# Patient Record
Sex: Female | Born: 1940
Health system: Southern US, Community
[De-identification: ages and names within clinical notes are randomized; demographics above are authoritative.]

## PROBLEM LIST (undated history)

## (undated) DIAGNOSIS — I10 Essential (primary) hypertension: Secondary | ICD-10-CM

## (undated) DIAGNOSIS — M109 Gout, unspecified: Secondary | ICD-10-CM

## (undated) DIAGNOSIS — IMO0002 Reserved for concepts with insufficient information to code with codable children: Secondary | ICD-10-CM

## (undated) DIAGNOSIS — M810 Age-related osteoporosis without current pathological fracture: Secondary | ICD-10-CM

## (undated) DIAGNOSIS — E785 Hyperlipidemia, unspecified: Secondary | ICD-10-CM

## (undated) DIAGNOSIS — K219 Gastro-esophageal reflux disease without esophagitis: Secondary | ICD-10-CM

## (undated) HISTORY — DX: Age-related osteoporosis without current pathological fracture: M81.0

## (undated) HISTORY — DX: Reserved for concepts with insufficient information to code with codable children: IMO0002

## (undated) HISTORY — DX: Hyperlipidemia, unspecified: E78.5

## (undated) HISTORY — DX: Gout, unspecified: M10.9

## (undated) HISTORY — DX: Essential (primary) hypertension: I10

## (undated) HISTORY — DX: Gastro-esophageal reflux disease without esophagitis: K21.9

---

## 1959-06-08 HISTORY — PX: TONSILLECTOMY: SUR1361

## 1985-06-07 HISTORY — PX: LAMINECTOMY: SHX219

## 1990-06-07 HISTORY — PX: ABDOMINAL HYSTERECTOMY: SHX81

## 2001-12-06 ENCOUNTER — Other Ambulatory Visit: Admission: RE | Admit: 2001-12-06 | Discharge: 2001-12-06 | Payer: Self-pay | Admitting: Gynecology

## 2002-06-04 ENCOUNTER — Encounter: Admission: RE | Admit: 2002-06-04 | Discharge: 2002-06-04 | Payer: Self-pay | Admitting: *Deleted

## 2002-06-04 ENCOUNTER — Encounter: Payer: Self-pay | Admitting: *Deleted

## 2002-12-13 ENCOUNTER — Other Ambulatory Visit: Admission: RE | Admit: 2002-12-13 | Discharge: 2002-12-13 | Payer: Self-pay | Admitting: Gynecology

## 2003-06-28 ENCOUNTER — Encounter: Admission: RE | Admit: 2003-06-28 | Discharge: 2003-06-28 | Payer: Self-pay | Admitting: Gynecology

## 2003-12-16 ENCOUNTER — Other Ambulatory Visit: Admission: RE | Admit: 2003-12-16 | Discharge: 2003-12-16 | Payer: Self-pay | Admitting: Gynecology

## 2004-06-29 ENCOUNTER — Encounter: Admission: RE | Admit: 2004-06-29 | Discharge: 2004-06-29 | Payer: Self-pay | Admitting: Gynecology

## 2004-12-16 ENCOUNTER — Other Ambulatory Visit: Admission: RE | Admit: 2004-12-16 | Discharge: 2004-12-16 | Payer: Self-pay | Admitting: Gynecology

## 2005-06-30 ENCOUNTER — Encounter: Admission: RE | Admit: 2005-06-30 | Discharge: 2005-06-30 | Payer: Self-pay | Admitting: Gynecology

## 2005-12-09 ENCOUNTER — Other Ambulatory Visit: Admission: RE | Admit: 2005-12-09 | Discharge: 2005-12-09 | Payer: Self-pay | Admitting: Gynecology

## 2006-07-01 ENCOUNTER — Encounter: Admission: RE | Admit: 2006-07-01 | Discharge: 2006-07-01 | Payer: Self-pay | Admitting: Gynecology

## 2006-12-13 ENCOUNTER — Other Ambulatory Visit: Admission: RE | Admit: 2006-12-13 | Discharge: 2006-12-13 | Payer: Self-pay | Admitting: Gynecology

## 2007-07-04 ENCOUNTER — Encounter: Admission: RE | Admit: 2007-07-04 | Discharge: 2007-07-04 | Payer: Self-pay | Admitting: Gynecology

## 2008-07-04 ENCOUNTER — Encounter: Admission: RE | Admit: 2008-07-04 | Discharge: 2008-07-04 | Payer: Self-pay | Admitting: Gynecology

## 2009-01-24 ENCOUNTER — Encounter (INDEPENDENT_AMBULATORY_CARE_PROVIDER_SITE_OTHER): Payer: Self-pay | Admitting: *Deleted

## 2009-02-19 ENCOUNTER — Ambulatory Visit: Payer: Self-pay | Admitting: Gastroenterology

## 2009-02-20 ENCOUNTER — Telehealth (INDEPENDENT_AMBULATORY_CARE_PROVIDER_SITE_OTHER): Payer: Self-pay | Admitting: *Deleted

## 2009-03-05 ENCOUNTER — Encounter: Payer: Self-pay | Admitting: Gastroenterology

## 2009-03-05 ENCOUNTER — Ambulatory Visit: Payer: Self-pay | Admitting: Gastroenterology

## 2009-03-06 ENCOUNTER — Encounter: Payer: Self-pay | Admitting: Gastroenterology

## 2009-07-03 ENCOUNTER — Encounter: Admission: RE | Admit: 2009-07-03 | Discharge: 2009-07-03 | Payer: Self-pay | Admitting: Internal Medicine

## 2009-07-07 ENCOUNTER — Encounter: Admission: RE | Admit: 2009-07-07 | Discharge: 2009-07-07 | Payer: Self-pay | Admitting: Gynecology

## 2010-07-07 ENCOUNTER — Encounter
Admission: RE | Admit: 2010-07-07 | Discharge: 2010-07-07 | Payer: Self-pay | Source: Home / Self Care | Attending: Gynecology | Admitting: Gynecology

## 2010-12-28 ENCOUNTER — Other Ambulatory Visit: Payer: Self-pay | Admitting: Gynecology

## 2011-06-08 HISTORY — PX: EYE SURGERY: SHX253

## 2011-06-09 ENCOUNTER — Other Ambulatory Visit: Payer: Self-pay | Admitting: Gynecology

## 2011-06-09 DIAGNOSIS — Z1231 Encounter for screening mammogram for malignant neoplasm of breast: Secondary | ICD-10-CM

## 2011-07-13 ENCOUNTER — Ambulatory Visit
Admission: RE | Admit: 2011-07-13 | Discharge: 2011-07-13 | Disposition: A | Discharge status: Home / Self Care | Payer: BLUE CROSS/BLUE SHIELD | Source: Ambulatory Visit | Attending: Gynecology | Admitting: Gynecology

## 2011-07-13 DIAGNOSIS — Z1231 Encounter for screening mammogram for malignant neoplasm of breast: Secondary | ICD-10-CM

## 2012-06-23 ENCOUNTER — Other Ambulatory Visit: Payer: Self-pay | Admitting: Gynecology

## 2012-06-23 DIAGNOSIS — Z1231 Encounter for screening mammogram for malignant neoplasm of breast: Secondary | ICD-10-CM

## 2012-07-18 ENCOUNTER — Ambulatory Visit
Admission: RE | Admit: 2012-07-18 | Discharge: 2012-07-18 | Disposition: A | Discharge status: Home / Self Care | Payer: Medicare Other | Source: Ambulatory Visit | Attending: Gynecology | Admitting: Gynecology

## 2012-07-18 DIAGNOSIS — Z1231 Encounter for screening mammogram for malignant neoplasm of breast: Secondary | ICD-10-CM

## 2013-02-28 ENCOUNTER — Other Ambulatory Visit: Payer: Self-pay

## 2013-05-24 DIAGNOSIS — IMO0002 Reserved for concepts with insufficient information to code with codable children: Secondary | ICD-10-CM

## 2013-05-24 HISTORY — DX: Reserved for concepts with insufficient information to code with codable children: IMO0002

## 2013-05-25 ENCOUNTER — Ambulatory Visit
Admission: RE | Admit: 2013-05-25 | Discharge: 2013-05-25 | Disposition: A | Discharge status: Home / Self Care | Payer: Medicare Other | Source: Ambulatory Visit | Attending: Orthopaedic Surgery | Admitting: Orthopaedic Surgery

## 2013-05-25 ENCOUNTER — Other Ambulatory Visit: Payer: Self-pay | Admitting: Orthopaedic Surgery

## 2013-05-25 DIAGNOSIS — M549 Dorsalgia, unspecified: Secondary | ICD-10-CM

## 2013-06-15 ENCOUNTER — Other Ambulatory Visit: Payer: Self-pay | Admitting: Neurosurgery

## 2013-06-15 DIAGNOSIS — S32009A Unspecified fracture of unspecified lumbar vertebra, initial encounter for closed fracture: Secondary | ICD-10-CM

## 2013-06-22 ENCOUNTER — Ambulatory Visit
Admission: RE | Admit: 2013-06-22 | Discharge: 2013-06-22 | Disposition: A | Discharge status: Home / Self Care | Payer: Medicare HMO | Source: Ambulatory Visit | Attending: Neurosurgery | Admitting: Neurosurgery

## 2013-06-22 DIAGNOSIS — S32009A Unspecified fracture of unspecified lumbar vertebra, initial encounter for closed fracture: Secondary | ICD-10-CM

## 2013-06-22 MED ORDER — GADOBENATE DIMEGLUMINE 529 MG/ML IV SOLN
19.0000 mL | Freq: Once | INTRAVENOUS | Status: AC | PRN
  Administered 2013-06-22: 19 mL via INTRAVENOUS

## 2013-07-08 DIAGNOSIS — IMO0002 Reserved for concepts with insufficient information to code with codable children: Secondary | ICD-10-CM

## 2013-07-08 HISTORY — DX: Reserved for concepts with insufficient information to code with codable children: IMO0002

## 2013-07-26 ENCOUNTER — Other Ambulatory Visit: Payer: Self-pay

## 2013-07-26 DIAGNOSIS — Z1231 Encounter for screening mammogram for malignant neoplasm of breast: Secondary | ICD-10-CM

## 2013-08-07 ENCOUNTER — Ambulatory Visit
Admission: RE | Admit: 2013-08-07 | Discharge: 2013-08-07 | Disposition: A | Discharge status: Home / Self Care | Payer: Commercial Managed Care - HMO | Source: Ambulatory Visit

## 2013-08-07 DIAGNOSIS — Z1231 Encounter for screening mammogram for malignant neoplasm of breast: Secondary | ICD-10-CM

## 2013-12-06 ENCOUNTER — Other Ambulatory Visit (HOSPITAL_COMMUNITY): Payer: Self-pay | Admitting: Adult Health Nurse Practitioner

## 2013-12-06 ENCOUNTER — Ambulatory Visit (HOSPITAL_COMMUNITY)
Admission: RE | Admit: 2013-12-06 | Discharge: 2013-12-06 | Disposition: A | Discharge status: Home / Self Care | Payer: Medicare HMO | Source: Ambulatory Visit | Attending: Adult Health Nurse Practitioner | Admitting: Adult Health Nurse Practitioner

## 2013-12-06 DIAGNOSIS — R609 Edema, unspecified: Secondary | ICD-10-CM | POA: Insufficient documentation

## 2013-12-06 DIAGNOSIS — M79609 Pain in unspecified limb: Secondary | ICD-10-CM | POA: Insufficient documentation

## 2013-12-06 DIAGNOSIS — R52 Pain, unspecified: Secondary | ICD-10-CM

## 2014-01-24 ENCOUNTER — Encounter: Payer: Self-pay | Admitting: Gastroenterology

## 2014-01-29 ENCOUNTER — Other Ambulatory Visit: Payer: Self-pay | Admitting: Family Medicine

## 2014-02-19 ENCOUNTER — Encounter: Payer: Self-pay | Admitting: Family Medicine

## 2014-02-19 ENCOUNTER — Ambulatory Visit (INDEPENDENT_AMBULATORY_CARE_PROVIDER_SITE_OTHER): Payer: Commercial Managed Care - HMO | Admitting: Family Medicine

## 2014-02-19 ENCOUNTER — Encounter (INDEPENDENT_AMBULATORY_CARE_PROVIDER_SITE_OTHER): Payer: Self-pay

## 2014-02-19 VITALS — BP 156/93 | HR 67 | Temp 97.8°F | Ht 63.0 in | Wt 191.0 lb

## 2014-02-19 DIAGNOSIS — M1A00X0 Idiopathic chronic gout, unspecified site, without tophus (tophi): Secondary | ICD-10-CM

## 2014-02-19 DIAGNOSIS — I10 Essential (primary) hypertension: Secondary | ICD-10-CM

## 2014-02-19 DIAGNOSIS — K219 Gastro-esophageal reflux disease without esophagitis: Secondary | ICD-10-CM

## 2014-02-19 DIAGNOSIS — M1A9XX0 Chronic gout, unspecified, without tophus (tophi): Secondary | ICD-10-CM

## 2014-02-19 DIAGNOSIS — Z23 Encounter for immunization: Secondary | ICD-10-CM

## 2014-02-19 DIAGNOSIS — E785 Hyperlipidemia, unspecified: Secondary | ICD-10-CM

## 2014-02-19 DIAGNOSIS — M810 Age-related osteoporosis without current pathological fracture: Secondary | ICD-10-CM

## 2014-02-19 DIAGNOSIS — M109 Gout, unspecified: Secondary | ICD-10-CM | POA: Insufficient documentation

## 2014-02-19 NOTE — Progress Notes (Signed)
   Subjective:    Patient ID: Caitlyn Boone, female    DOB: 30-Dec-1940, 73 y.o.   MRN: [id number]  HPI  73 year old female who is here to followup hypertension hyperlipidemia osteoporosis GERD and gout. Actual purpose of her visit is to obtain referral back to the orthopedic doctor in [facility] for what sounds like a fractured patella. She suffered a vertebral fracture about a year ago when she was helping to pick up a person who had fallen and she in turn fell. She is graduated from the wheelchair to a walker to cane over the last year. She has been started on a bisphosphonate for osteoporosis. She also takes calcium and vitamin D    Review of Systems  Constitutional: Negative.   HENT: Negative.   Eyes: Negative.   Respiratory: Negative.   Cardiovascular: Negative.   Gastrointestinal: Negative.   Endocrine: Negative.   Genitourinary: Negative.   Musculoskeletal: Positive for myalgias.       Bilateral thumb pain  Skin: Rash: not true rash but healing intertrigo.  Hematological: Negative.   Psychiatric/Behavioral: Negative.        Objective:   Physical Exam  Constitutional: She is oriented to person, place, and time. She appears well-developed and well-nourished.  Eyes: Conjunctivae and EOM are normal.  Neck: Normal range of motion. Neck supple.  Cardiovascular: Normal rate, regular rhythm and normal heart sounds.   Pulmonary/Chest: Effort normal and breath sounds normal.  Abdominal: Soft. Bowel sounds are normal.  Musculoskeletal: Normal range of motion.  Neurological: She is alert and oriented to person, place, and time. She has normal reflexes.  Skin: Skin is warm and dry.  Psychiatric: She has a normal mood and affect. Her behavior is normal. Thought content normal.    BP 156/93  Pulse 67  Temp(Src) 97.8 F (36.6 C) (Oral)  Ht 5\' 3"  (1.6 m)  Wt 191 lb (86.637 kg)  BMI 33.84 kg/m2      Assessment & Plan:  1. Other and unspecified hyperlipidemia On 80 mg  pravastatin; no side effect; lipids measures in June  2. Osteoporosis, unspecified On Fosamax  3. Unspecified essential hypertension BP up some today; will follow  4. Gastroesophageal reflux disease, esophagitis presence not specified Ranitidine controls but takles q day  5. Chronic gout without tophus, unspecified cause, unspecified site Does not recall last acute attack; on Allopurinol  Wardell Honour MD

## 2014-02-19 NOTE — Patient Instructions (Signed)

## 2014-02-25 ENCOUNTER — Ambulatory Visit: Payer: Self-pay | Admitting: Nurse Practitioner

## 2014-03-07 ENCOUNTER — Encounter: Payer: Self-pay | Admitting: Gastroenterology

## 2014-04-02 ENCOUNTER — Ambulatory Visit (INDEPENDENT_AMBULATORY_CARE_PROVIDER_SITE_OTHER): Payer: Commercial Managed Care - HMO

## 2014-04-02 DIAGNOSIS — Z23 Encounter for immunization: Secondary | ICD-10-CM

## 2014-05-10 ENCOUNTER — Other Ambulatory Visit: Payer: Self-pay | Admitting: *Deleted

## 2014-05-10 MED ORDER — RANITIDINE HCL 300 MG PO CAPS: 300.0000 mg | ORAL_CAPSULE | Freq: Every evening | ORAL | Status: DC

## 2014-05-21 ENCOUNTER — Telehealth: Payer: Self-pay | Admitting: Family Medicine

## 2014-05-21 ENCOUNTER — Emergency Department (HOSPITAL_COMMUNITY)
Admission: EM | Admit: 2014-05-21 | Discharge: 2014-05-22 | Disposition: A | Discharge status: Home / Self Care | Payer: Medicare HMO | Attending: Emergency Medicine | Admitting: Emergency Medicine

## 2014-05-21 ENCOUNTER — Encounter (HOSPITAL_COMMUNITY): Payer: Self-pay | Admitting: Emergency Medicine

## 2014-05-21 ENCOUNTER — Ambulatory Visit (INDEPENDENT_AMBULATORY_CARE_PROVIDER_SITE_OTHER): Payer: Commercial Managed Care - HMO | Admitting: Family Medicine

## 2014-05-21 ENCOUNTER — Emergency Department (HOSPITAL_COMMUNITY): Payer: Medicare HMO

## 2014-05-21 VITALS — BP 147/89 | HR 102 | Temp 100.4°F | Ht 63.0 in | Wt 190.6 lb

## 2014-05-21 DIAGNOSIS — E785 Hyperlipidemia, unspecified: Secondary | ICD-10-CM | POA: Diagnosis not present

## 2014-05-21 DIAGNOSIS — N390 Urinary tract infection, site not specified: Secondary | ICD-10-CM | POA: Diagnosis not present

## 2014-05-21 DIAGNOSIS — Z7982 Long term (current) use of aspirin: Secondary | ICD-10-CM | POA: Diagnosis not present

## 2014-05-21 DIAGNOSIS — K8020 Calculus of gallbladder without cholecystitis without obstruction: Secondary | ICD-10-CM | POA: Diagnosis not present

## 2014-05-21 DIAGNOSIS — R630 Anorexia: Secondary | ICD-10-CM | POA: Insufficient documentation

## 2014-05-21 DIAGNOSIS — Z79899 Other long term (current) drug therapy: Secondary | ICD-10-CM | POA: Insufficient documentation

## 2014-05-21 DIAGNOSIS — M109 Gout, unspecified: Secondary | ICD-10-CM | POA: Diagnosis not present

## 2014-05-21 DIAGNOSIS — K219 Gastro-esophageal reflux disease without esophagitis: Secondary | ICD-10-CM | POA: Insufficient documentation

## 2014-05-21 DIAGNOSIS — Z8781 Personal history of (healed) traumatic fracture: Secondary | ICD-10-CM | POA: Insufficient documentation

## 2014-05-21 DIAGNOSIS — R1031 Right lower quadrant pain: Secondary | ICD-10-CM

## 2014-05-21 DIAGNOSIS — R1011 Right upper quadrant pain: Secondary | ICD-10-CM

## 2014-05-21 DIAGNOSIS — R109 Unspecified abdominal pain: Secondary | ICD-10-CM | POA: Diagnosis present

## 2014-05-21 DIAGNOSIS — Z90710 Acquired absence of both cervix and uterus: Secondary | ICD-10-CM | POA: Insufficient documentation

## 2014-05-21 DIAGNOSIS — N12 Tubulo-interstitial nephritis, not specified as acute or chronic: Secondary | ICD-10-CM | POA: Diagnosis not present

## 2014-05-21 DIAGNOSIS — I10 Essential (primary) hypertension: Secondary | ICD-10-CM | POA: Insufficient documentation

## 2014-05-21 DIAGNOSIS — R110 Nausea: Secondary | ICD-10-CM | POA: Diagnosis not present

## 2014-05-21 DIAGNOSIS — M810 Age-related osteoporosis without current pathological fracture: Secondary | ICD-10-CM | POA: Insufficient documentation

## 2014-05-21 LAB — POCT URINALYSIS DIPSTICK
Bilirubin, UA: NEGATIVE
Blood, UA: NEGATIVE
Glucose, UA: NEGATIVE
Ketones, UA: NEGATIVE
Nitrite, UA: NEGATIVE
Protein, UA: NEGATIVE
Spec Grav, UA: 1.01
Urobilinogen, UA: NEGATIVE
pH, UA: 6

## 2014-05-21 LAB — CBC WITH DIFFERENTIAL/PLATELET
Basophils Absolute: 0 10*3/uL (ref 0.0–0.1)
Basophils Relative: 0 % (ref 0–1)
EOS ABS: 0 10*3/uL (ref 0.0–0.7)
EOS PCT: 0 % (ref 0–5)
HCT: 44.8 % (ref 36.0–46.0)
Hemoglobin: 15 g/dL (ref 12.0–15.0)
LYMPHS ABS: 2.1 10*3/uL (ref 0.7–4.0)
Lymphocytes Relative: 22 % (ref 12–46)
MCH: 30 pg (ref 26.0–34.0)
MCHC: 33.5 g/dL (ref 30.0–36.0)
MCV: 89.6 fL (ref 78.0–100.0)
MONOS PCT: 7 % (ref 3–12)
Monocytes Absolute: 0.7 10*3/uL (ref 0.1–1.0)
NEUTROS PCT: 71 % (ref 43–77)
Neutro Abs: 6.7 10*3/uL (ref 1.7–7.7)
PLATELETS: 329 10*3/uL (ref 150–400)
RBC: 5 MIL/uL (ref 3.87–5.11)
RDW: 14.5 % (ref 11.5–15.5)
WBC: 9.6 10*3/uL (ref 4.0–10.5)

## 2014-05-21 LAB — COMPREHENSIVE METABOLIC PANEL
ALT: 31 U/L (ref 0–35)
ANION GAP: 13 (ref 5–15)
AST: 36 U/L (ref 0–37)
Albumin: 4.2 g/dL (ref 3.5–5.2)
Alkaline Phosphatase: 61 U/L (ref 39–117)
BUN: 12 mg/dL (ref 6–23)
CALCIUM: 11.1 mg/dL — AB (ref 8.4–10.5)
CO2: 28 mEq/L (ref 19–32)
Chloride: 101 mEq/L (ref 96–112)
Creatinine, Ser: 0.79 mg/dL (ref 0.50–1.10)
GFR calc non Af Amer: 80 mL/min — ABNORMAL LOW (ref >90)
GLUCOSE: 109 mg/dL — AB (ref 70–99)
Potassium: 4.2 mEq/L (ref 3.7–5.3)
SODIUM: 142 meq/L (ref 137–147)
Total Bilirubin: 0.5 mg/dL (ref 0.3–1.2)
Total Protein: 8.3 g/dL (ref 6.0–8.3)

## 2014-05-21 LAB — POCT UA - MICROSCOPIC ONLY
Casts, Ur, LPF, POC: NEGATIVE
Crystals, Ur, HPF, POC: NEGATIVE
Mucus, UA: NEGATIVE
Yeast, UA: NEGATIVE

## 2014-05-21 LAB — POCT CBC
Granulocyte percent: 78.3 %G (ref 37–80)
HCT, POC: 47.2 % (ref 37.7–47.9)
Hemoglobin: 15.7 g/dL (ref 12.2–16.2)
Lymph, poc: 1.9 (ref 0.6–3.4)
MCH, POC: 30.3 pg (ref 27–31.2)
MCHC: 33.2 g/dL (ref 31.8–35.4)
MCV: 91.4 fL (ref 80–97)
MPV: 8.7 fL (ref 0–99.8)
POC Granulocyte: 8.7 — AB (ref 2–6.9)
POC LYMPH PERCENT: 17.5 %L (ref 10–50)
Platelet Count, POC: 355 10*3/uL (ref 142–424)
RBC: 5.2 M/uL (ref 4.04–5.48)
RDW, POC: 14.7 %
WBC: 11.1 10*3/uL — AB (ref 4.6–10.2)

## 2014-05-21 LAB — LIPASE, BLOOD: Lipase: 46 U/L (ref 11–59)

## 2014-05-21 NOTE — ED Provider Notes (Signed)
CSN: [id number]     Arrival date & time 05/21/14  1818 History   First MD Initiated Contact with Patient 05/21/14 2238     Chief Complaint  Patient presents with  . Abdominal Pain     (Consider location/radiation/quality/duration/timing/severity/associated sxs/prior Treatment) HPI Comments: Patient with no past history of abdominal surgeries presents with complaint of right upper and lateral abdominal pain with nausea, decreased appetite, fever and chills which began 24 hours ago. Pain began gradually and worsened overnight. Patient saw her primary care physician today and was referred to the emergency department. At her PCP she was noted to have a mildly elevated white blood cell count, possible urinary tract infection. Patient denies chest pain, shortness of breath, diarrhea, dysuria or hematuria. No treatments prior to arrival. The onset of this condition was acute. The course is constant. Aggravating factors: none. Alleviating factors: none.  The history is provided by the patient.    Past Medical History  Diagnosis Date  . Hypertension   . Hyperlipidemia   . GERD (gastroesophageal reflux disease)   . Gout   . Osteoporosis   . Compression fracture     L1   Past Surgical History  Procedure Laterality Date  . Laminectomy  1987    L5-S1  . Abdominal hysterectomy  1997   Family History  Problem Relation Age of Onset  . Heart disease Mother   . Cancer Father     smoker  . Cancer Brother     lung and colon  . COPD Brother   . Parkinson's disease Brother    History  Substance Use Topics  . Smoking status: Never Smoker   . Smokeless tobacco: Never Used  . Alcohol Use: No   OB History    No data available     Review of Systems  Constitutional: Negative for fever.  HENT: Negative for rhinorrhea and sore throat.   Eyes: Negative for redness.  Respiratory: Negative for cough and shortness of breath.   Cardiovascular: Negative for chest pain.  Gastrointestinal:  Positive for nausea and abdominal pain. Negative for vomiting, diarrhea and constipation.  Genitourinary: Negative for dysuria.  Musculoskeletal: Negative for myalgias.  Skin: Negative for rash.  Neurological: Negative for headaches.    Allergies  Review of patient's allergies indicates no known allergies.  Home Medications   Prior to Admission medications   Medication Sig Start Date End Date Taking? Authorizing Provider  alendronate (FOSAMAX) 70 MG tablet Take 70 mg by mouth once a week. Take with a full glass of water on an empty stomach.    Historical Provider, MD  allopurinol (ZYLOPRIM) 300 MG tablet Take 300 mg by mouth daily.    Historical Provider, MD  aspirin 81 MG tablet Take 81 mg by mouth daily.    Historical Provider, MD  calcium citrate (CALCITRATE - DOSED IN MG ELEMENTAL CALCIUM) 950 MG tablet Take 200 mg of elemental calcium by mouth daily.    Historical Provider, MD  Cholecalciferol (VITAMIN D) 2000 UNITS CAPS Take 1 capsule by mouth daily.    Historical Provider, MD  lisinopril-hydrochlorothiazide (PRINZIDE,ZESTORETIC) 20-25 MG per tablet Take 1 tablet by mouth daily.    Historical Provider, MD  Multiple Vitamins-Minerals (WOMENS 50+ MULTI VITAMIN/MIN PO) Take 1 tablet by mouth daily.    Historical Provider, MD  pravastatin (PRAVACHOL) 80 MG tablet Take 80 mg by mouth daily.    Historical Provider, MD  ranitidine (ZANTAC) 300 MG capsule Take 1 capsule (300 mg total) by mouth every  evening. 05/10/14   Wardell Honour, MD   BP 143/102 mmHg  Pulse 89  Temp(Src) 98.6 F (37 C) (Oral)  Resp 14  Ht 5\' 4"  (1.626 m)  Wt 190 lb (86.183 kg)  BMI 32.60 kg/m2  SpO2 94%   Physical Exam  Constitutional: She appears well-developed and well-nourished.  HENT:  Head: Normocephalic and atraumatic.  Mouth/Throat: Oropharynx is clear and moist.  Eyes: Conjunctivae are normal. Right eye exhibits no discharge. Left eye exhibits no discharge.  Neck: Normal range of motion. Neck  supple.  Cardiovascular: Normal rate, regular rhythm and normal heart sounds.   No murmur heard. Pulmonary/Chest: Effort normal and breath sounds normal. No respiratory distress. She has no wheezes. She has no rales.  Abdominal: Soft. Bowel sounds are decreased. There is tenderness in the right upper quadrant, epigastric area and periumbilical area. There is positive Murphy's sign. There is no rigidity, no guarding, no CVA tenderness and no tenderness at McBurney's point.    Neurological: She is alert.  Skin: Skin is warm and dry.  Psychiatric: She has a normal mood and affect.  Nursing note and vitals reviewed.   ED Course  Procedures (including critical care time) Labs Review Labs Reviewed  COMPREHENSIVE METABOLIC PANEL - Abnormal; Notable for the following:    Glucose, Bld 109 (*)    Calcium 11.1 (*)    GFR calc non Af Amer 80 (*)    All other components within normal limits  URINALYSIS, ROUTINE W REFLEX MICROSCOPIC - Abnormal; Notable for the following:    APPearance CLOUDY (*)    Ketones, ur 15 (*)    Leukocytes, UA LARGE (*)    All other components within normal limits  URINE MICROSCOPIC-ADD ON - Abnormal; Notable for the following:    Squamous Epithelial / LPF FEW (*)    Bacteria, UA FEW (*)    All other components within normal limits  URINE CULTURE  CBC WITH DIFFERENTIAL  LIPASE, BLOOD    Imaging Review No results found.   EKG Interpretation None       11:16 PM Patient seen and examined. [place] ordered given RUQ pain. Discussed with patient that if this is negative would need abd CT.   Vital signs reviewed and are as follows: BP 143/102 mmHg  Pulse 89  Temp(Src) 98.6 F (37 C) (Oral)  Resp 14  Ht 5\' 4"  (1.626 m)  Wt 190 lb (86.183 kg)  BMI 32.60 kg/m2  SpO2 94%  1:02 AM Handoff to Dr. Ashok Cordia who will see and follow-up on [place] results. Also consider pyelo given UA findings.   MDM   Final diagnoses:  RUQ abdominal pain   Pending completion of work-up.       Carlisle Cater, PA-C 05/22/14 [place], MD 05/22/14 [phone]

## 2014-05-21 NOTE — Progress Notes (Signed)
   Subjective:    Patient ID: Caitlyn Boone, female    DOB: [birth year], 73 y.o.   MRN: [id number]  HPI Patient is here with c/o severe right upper quadrant abdominal pain.  The pain started yesterday and started after eating hamburger.  She developed nausea, fever, and she cannot take a deep breath.  Review of Systems  Constitutional: Negative for fever.  HENT: Negative for ear pain.   Eyes: Negative for discharge.  Respiratory: Negative for cough.   Cardiovascular: Negative for chest pain.  Gastrointestinal: Negative for abdominal distention.  Endocrine: Negative for polyuria.  Genitourinary: Negative for difficulty urinating.  Musculoskeletal: Negative for gait problem and neck pain.  Skin: Negative for color change and rash.  Neurological: Negative for speech difficulty and headaches.  Psychiatric/Behavioral: Negative for agitation.       Objective:    BP 147/89 mmHg  Pulse 102  Temp(Src) 100.4 F (38 C) (Oral)  Ht 5\' 3"  (1.6 m)  Wt 190 lb 9.6 oz (86.456 kg)  BMI 33.77 kg/m2   Physical Exam  Constitutional: She is oriented to person, place, and time. She appears well-developed and well-nourished.  HENT:  Head: Normocephalic and atraumatic.  Mouth/Throat: Oropharynx is clear and moist.  Eyes: Pupils are equal, round, and reactive to light.  Neck: Normal range of motion. Neck supple.  Cardiovascular: Normal rate and regular rhythm.   No murmur heard. Pulmonary/Chest: Effort normal and breath sounds normal.  Abdominal: There is tenderness. There is guarding.  Positive Murphy's   Neurological: She is alert and oriented to person, place, and time.  Skin: Skin is warm and dry.  Psychiatric: She has a normal mood and affect.    Results for orders placed or performed in visit on 05/21/14  POCT UA - Microscopic Only  Result Value Ref Range   WBC, Ur, HPF, POC 15-20    RBC, urine, microscopic rare    Bacteria, U Microscopic occ    Mucus, UA neg    Epithelial cells,  urine per micros occ    Crystals, Ur, HPF, POC neg    Casts, Ur, LPF, POC neg    Yeast, UA neg   POCT urinalysis dipstick  Result Value Ref Range   Color, UA yellow    Clarity, UA clear    Glucose, UA neg    Bilirubin, UA neg    Ketones, UA neg    Spec Grav, UA 1.010    Blood, UA neg    pH, UA 6.0    Protein, UA neg    Urobilinogen, UA negative    Nitrite, UA neg    Leukocytes, UA small (1+)         Assessment & Plan:     ICD-9-CM ICD-10-CM   1. RLQ abdominal pain 789.03 R10.31 POCT UA - Microscopic Only     POCT urinalysis dipstick   Recommend she go to ED and [facility] ED called and given report and patient is going via POV  No Follow-up on file.  Lysbeth Penner FNP

## 2014-05-21 NOTE — Addendum Note (Signed)
Addended by: Selmer Dominion on: 05/21/2014 05:34 PM   Modules accepted: Orders

## 2014-05-21 NOTE — Telephone Encounter (Signed)
Stp started having pain on her right side yesterday under her rib cage, it sometimes goes to her back and she states she has had to go to the bathroom a lot. Given appt today with Dietrich Pates @ 5.

## 2014-05-21 NOTE — ED Notes (Signed)
Pt. reports right lateral abdominal pain with nausea , fever and chills onset yesterday evening .

## 2014-05-22 LAB — URINALYSIS, ROUTINE W REFLEX MICROSCOPIC
BILIRUBIN URINE: NEGATIVE
Glucose, UA: NEGATIVE mg/dL
HGB URINE DIPSTICK: NEGATIVE
KETONES UR: 15 mg/dL — AB
Nitrite: NEGATIVE
PROTEIN: NEGATIVE mg/dL
Specific Gravity, Urine: 1.022 (ref 1.005–1.030)
UROBILINOGEN UA: 0.2 mg/dL (ref 0.0–1.0)
pH: 6.5 (ref 5.0–8.0)

## 2014-05-22 LAB — URINE MICROSCOPIC-ADD ON

## 2014-05-22 MED ORDER — ONDANSETRON HCL 4 MG/2ML IJ SOLN
4.0000 mg | Freq: Once | INTRAMUSCULAR | Status: AC
  Administered 2014-05-22: 4 mg via INTRAVENOUS
  Filled 2014-05-22: qty 2

## 2014-05-22 MED ORDER — CEFTRIAXONE SODIUM 1 G IJ SOLR
1.0000 g | Freq: Once | INTRAMUSCULAR | Status: AC
  Administered 2014-05-22: 1 g via INTRAVENOUS
  Filled 2014-05-22: qty 10

## 2014-05-22 MED ORDER — CIPROFLOXACIN HCL 500 MG PO TABS: 500.0000 mg | ORAL_TABLET | Freq: Two times a day (BID) | ORAL | Status: DC

## 2014-05-22 MED ORDER — MORPHINE SULFATE 4 MG/ML IJ SOLN
4.0000 mg | Freq: Once | INTRAMUSCULAR | Status: AC
  Administered 2014-05-22: 4 mg via INTRAVENOUS
  Filled 2014-05-22: qty 1

## 2014-05-22 MED ORDER — SODIUM CHLORIDE 0.9 % IV BOLUS (SEPSIS)
500.0000 mL | Freq: Once | INTRAVENOUS | Status: AC
  Administered 2014-05-22: 500 mL via INTRAVENOUS

## 2014-05-22 MED ORDER — HYDROCODONE-ACETAMINOPHEN 5-325 MG PO TABS: 1.0000 | ORAL_TABLET | Freq: Four times a day (QID) | ORAL | Status: DC | PRN

## 2014-05-22 MED ORDER — ONDANSETRON 8 MG PO TBDP: 8.0000 mg | ORAL_TABLET | Freq: Three times a day (TID) | ORAL | Status: DC | PRN

## 2014-05-22 NOTE — Discharge Instructions (Signed)
It was our pleasure to provide your ER care today - we hope that you feel better.  Rest. Drink plenty of fluids.  The lab tests show a urine infection - take cipro (antibiotic) as prescribed.  A urine culture was sent the results of which will be back in 2 days time - have your doctor follow up on those results then.  Take motrin or aleve as need for pain. You may also take hydrocodone as need for pain. No driving for the next 6 hours or when taking hydrocodone. Also, do not take tylenol or acetaminophen containing medication when taking hydrocodone.  You may take zofran as need if nauseated.  Your ultrasound showed gallstones. Follow up with general surgeon in the next 1-2 weeks - see referral - call office tomorrow to arrange follow up appointment.  Return to ER right away if worse, new symptoms, persistent vomiting, worsening or severe pain, high fevers, other concern.    Pyelonephritis, Adult Pyelonephritis is a kidney infection. In general, there are 2 main types of pyelonephritis:  Infections that come on quickly without any warning (acute pyelonephritis).  Infections that persist for a long period of time (chronic pyelonephritis). CAUSES  Two main causes of pyelonephritis are:  Bacteria traveling from the bladder to the kidney. This is a problem especially in pregnant women. The urine in the bladder can become filled with bacteria from multiple causes, including:  Inflammation of the prostate gland (prostatitis).  Sexual intercourse in females.  Bladder infection (cystitis).  Bacteria traveling from the bloodstream to the tissue part of the kidney. Problems that may increase your risk of getting a kidney infection include:  Diabetes.  Kidney stones or bladder stones.  Cancer.  Catheters placed in the bladder.  Other abnormalities of the kidney or ureter. SYMPTOMS   Abdominal pain.  Pain in the side or flank area.  Fever.  Chills.  Upset stomach.  Blood  in the urine (dark urine).  Frequent urination.  Strong or persistent urge to urinate.  Burning or stinging when urinating. DIAGNOSIS  Your caregiver may diagnose your kidney infection based on your symptoms. A urine sample may also be taken. TREATMENT  In general, treatment depends on how severe the infection is.   If the infection is mild and caught early, your caregiver may treat you with oral antibiotics and send you home.  If the infection is more severe, the bacteria may have gotten into the bloodstream. This will require intravenous (IV) antibiotics and a hospital stay. Symptoms may include:  High fever.  Severe flank pain.  Shaking chills.  Even after a hospital stay, your caregiver may require you to be on oral antibiotics for a period of time.  Other treatments may be required depending upon the cause of the infection. HOME CARE INSTRUCTIONS   Take your antibiotics as directed. Finish them even if you start to feel better.  Make an appointment to have your urine checked to make sure the infection is gone.  Drink enough fluids to keep your urine clear or pale yellow.  Take medicines for the bladder if you have urgency and frequency of urination as directed by your caregiver. SEEK IMMEDIATE MEDICAL CARE IF:   You have a fever or persistent symptoms for more than 2-3 days.  You have a fever and your symptoms suddenly get worse.  You are unable to take your antibiotics or fluids.  You develop shaking chills.  You experience extreme weakness or fainting.  There is no improvement  after 2 days of treatment. MAKE SURE YOU:  Understand these instructions.  Will watch your condition.  Will get help right away if you are not doing well or get worse. Document Released: 05/24/2005 Document Revised: 11/23/2011 Document Reviewed: 10/28/2010 [facility] Patient Information 2015 [place], [place]. This information is not intended to replace advice given to you by your  health care provider. Make sure you discuss any questions you have with your health care provider.    Cholelithiasis Cholelithiasis (also called gallstones) is a form of gallbladder disease in which gallstones form in your gallbladder. The gallbladder is an organ that stores bile made in the liver, which helps digest fats. Gallstones begin as small crystals and slowly grow into stones. Gallstone pain occurs when the gallbladder spasms and a gallstone is blocking the duct. Pain can also occur when a stone passes out of the duct.  RISK FACTORS  Being female.   Having multiple pregnancies. Health care providers sometimes advise removing diseased gallbladders before future pregnancies.   Being obese.  Eating a diet heavy in fried foods and fat.   Being older than 47 years and increasing age.   Prolonged use of medicines containing female hormones.   Having diabetes mellitus.   Rapidly losing weight.   Having a family history of gallstones (heredity).  SYMPTOMS  Nausea.   Vomiting.  Abdominal pain.   Yellowing of the skin (jaundice).   Sudden pain. It may persist from several minutes to several hours.  Fever.   Tenderness to the touch. In some cases, when gallstones do not move into the bile duct, people have no pain or symptoms. These are called "silent" gallstones.  TREATMENT Silent gallstones do not need treatment. In severe cases, emergency surgery may be required. Options for treatment include:  Surgery to remove the gallbladder. This is the most common treatment.  Medicines. These do not always work and may take 6-12 months or more to work.  Shock wave treatment (extracorporeal biliary lithotripsy). In this treatment an ultrasound machine sends shock waves to the gallbladder to break gallstones into smaller pieces that can pass into the intestines or be dissolved by medicine. HOME CARE INSTRUCTIONS   Only take over-the-counter or prescription medicines  for pain, discomfort, or fever as directed by your health care provider.   Follow a low-fat diet until seen again by your health care provider. Fat causes the gallbladder to contract, which can result in pain.   Follow up with your health care provider as directed. Attacks are almost always recurrent and surgery is usually required for permanent treatment.  SEEK IMMEDIATE MEDICAL CARE IF:   Your pain increases and is not controlled by medicines.   You have a fever or persistent symptoms for more than 2-3 days.   You have a fever and your symptoms suddenly get worse.   You have persistent nausea and vomiting.  MAKE SURE YOU:   Understand these instructions.  Will watch your condition.  Will get help right away if you are not doing well or get worse. Document Released: 05/20/2005 Document Revised: 01/24/2013 Document Reviewed: 11/15/2012 [facility] Patient Information 2015 [place], [place]. This information is not intended to replace advice given to you by your health care provider. Make sure you discuss any questions you have with your health care provider.

## 2014-05-24 LAB — URINE CULTURE

## 2014-05-27 ENCOUNTER — Telehealth (HOSPITAL_COMMUNITY): Payer: Self-pay

## 2014-05-27 NOTE — Telephone Encounter (Signed)
Post ED Visit - Positive Culture Follow-up  Culture report reviewed by antimicrobial stewardship pharmacist: [x]  Wes [place], Pharm.D., BCPS []  Heide Guile, Pharm.D., BCPS []  Alycia Rossetti, Pharm.D., BCPS []  [place], [place].D., BCPS, AAHIVP []  Legrand Como, Pharm.D., BCPS, AAHIVP []  Isac Sarna, Pharm.D., BCPS  Positive Urine  Culture, 40,000 colonies -> Proteus Mirabilis Treated with Ciprofloxacin, organism sensitive to the same and no further patient follow-up is required at this time.  Dortha Kern 05/27/2014, 12:04 AM

## 2014-06-13 ENCOUNTER — Other Ambulatory Visit (INDEPENDENT_AMBULATORY_CARE_PROVIDER_SITE_OTHER): Payer: Self-pay | Admitting: General Surgery

## 2014-06-13 DIAGNOSIS — N390 Urinary tract infection, site not specified: Secondary | ICD-10-CM | POA: Diagnosis not present

## 2014-06-13 DIAGNOSIS — Z6832 Body mass index (BMI) 32.0-32.9, adult: Secondary | ICD-10-CM | POA: Diagnosis not present

## 2014-06-13 DIAGNOSIS — I10 Essential (primary) hypertension: Secondary | ICD-10-CM | POA: Diagnosis not present

## 2014-06-13 DIAGNOSIS — K8020 Calculus of gallbladder without cholecystitis without obstruction: Secondary | ICD-10-CM | POA: Diagnosis not present

## 2014-06-13 DIAGNOSIS — Z90710 Acquired absence of both cervix and uterus: Secondary | ICD-10-CM | POA: Diagnosis not present

## 2014-06-18 ENCOUNTER — Other Ambulatory Visit: Payer: Self-pay | Admitting: *Deleted

## 2014-06-18 MED ORDER — PRAVASTATIN SODIUM 80 MG PO TABS: 80.0000 mg | ORAL_TABLET | Freq: Every day | ORAL | Status: DC

## 2014-06-25 ENCOUNTER — Other Ambulatory Visit (HOSPITAL_COMMUNITY): Payer: Self-pay | Admitting: *Deleted

## 2014-06-25 NOTE — H&P (Signed)
Caitlyn Boone  Location: [facility] Patient #: [id number] DOB: November 07, 1940 Married / Language: English / Race: White Female      History of Present Illness  Patient words: gallstones.  The patient is a 74 year old female who presents for evaluation of gall stones. This is a very pleasant 74 year old Caucasian female from [place]. Dr. Morrie Sheldon is her PCP. She is basically self-referred for management of symptomatic gallstones. I have operated on her husband with exploratory laparotomy many years ago and he is with her today. On May 21, 2014 she developed right upper quadrant pain, fever, chills, nausea, and right flank pain. She was seen at [facility] family practice and, according to her, sent to the emergency department because of fever. She was not having any chest pain or shortness of breath. Lab work revealed calcium 11.1, urinary tract infection. Ultrasound showed multiple gallstones but no signs of inflammation. CBD 3 mm. Kidneys showed slightly prominent cortical echogenicity but no hydronephrosis. She states she was given antibiotics for her urinary tract infection and was told she needed to have a cholecystectomy. Since that time she has had some postprandial nausea but no more pain. Comorbidities include BMI 32, hypertension, gout, history of abdominal hysterectomy, history of lumbar laminectomy, hyperlipidemia. She has no history of cardiac pulmonary or renal disease.   Other Problems  Back Pain Cholelithiasis Gastroesophageal Reflux Disease Hemorrhoids High blood pressure Hypercholesterolemia  Past Surgical History  Cataract Surgery Bilateral. Foot Surgery Bilateral. Hysterectomy (not due to cancer) - Complete Spinal Surgery - Lower Back Tonsillectomy  Diagnostic Studies History Colonoscopy 1-5 years ago Mammogram within last year Pap Smear 1-5 years ago  Allergies No Known Drug Allergies  Medication History   Alendronate Sodium (70MG  Tablet, Oral) Active. Allopurinol (300MG  Tablet, Oral) Active. Aspirin EC (81MG  Tablet DR, Oral) Active. Vitamin D (2000UNIT Capsule, Oral) Active. Lisinopril-Hydrochlorothiazide (20-25MG  Tablet, Oral) Active. Multivitamins (Oral) Active. Pravastatin Sodium (80MG  Tablet, Oral) Active. Zantac (300MG  Tablet, Oral) Active.  Social History Alcohol use Remotely quit alcohol use. Caffeine use Carbonated beverages, Coffee. No drug use Tobacco use Former smoker.  Family History Arthritis Mother. Cancer Brother. Colon Cancer Brother.  Pregnancy / Birth History  Age at menarche 41 years.  Review of Systems General Not Present- Appetite Loss, Chills, Fatigue, Fever, Night Sweats, Weight Gain and Weight Loss. Skin Not Present- Change in Wart/Mole, Dryness, Hives, Jaundice, New Lesions, Non-Healing Wounds, Rash and Ulcer. HEENT Present- Ringing in the Ears. Not Present- Earache, Hearing Loss, Hoarseness, Nose Bleed, Oral Ulcers, Seasonal Allergies, Sinus Pain, Sore Throat, Visual Disturbances, Wears glasses/contact lenses and Yellow Eyes. Respiratory Not Present- Bloody sputum, Chronic Cough, Difficulty Breathing, Snoring and Wheezing. Breast Not Present- Breast Mass, Breast Pain, Nipple Discharge and Skin Changes. Cardiovascular Not Present- Chest Pain, Difficulty Breathing Lying Down, Leg Cramps, Palpitations, Rapid Heart Rate, Shortness of Breath and Swelling of Extremities. Gastrointestinal Present- Nausea. Not Present- Abdominal Pain, Bloating, Bloody Stool, Change in Bowel Habits, Chronic diarrhea, Constipation, Difficulty Swallowing, Excessive gas, Gets full quickly at meals, Hemorrhoids, Indigestion, Rectal Pain and Vomiting. Female Genitourinary Present- Frequency. Not Present- Nocturia, Painful Urination, Pelvic Pain and Urgency. Musculoskeletal Present- Back Pain. Not Present- Joint Pain, Joint Stiffness, Muscle Pain, Muscle Weakness and  Swelling of Extremities. Neurological Not Present- Decreased Memory, Fainting, Headaches, Numbness, Seizures, Tingling, Tremor, Trouble walking and Weakness. Psychiatric Not Present- Anxiety, Bipolar, Change in Sleep Pattern, Depression, Fearful and Frequent crying. Endocrine Present- Hot flashes. Not Present- Cold Intolerance, Excessive Hunger, Hair Changes, Heat Intolerance  and New Diabetes. Hematology Not Present- Easy Bruising, Excessive bleeding, Gland problems, HIV and Persistent Infections.   Vitals   Weight: 190 lb Height: 64in Body Surface Area: 1.97 m Body Mass Index: 32.61 kg/m Temp.: 85F(Temporal)  Pulse: 67 (Regular)  BP: 128/80 (Sitting, Left Arm, Standard)    Physical Exam  General Mental Status-Alert. General Appearance-Consistent with stated age. Hydration-Well hydrated. Voice-Normal. Note: Central obesity.   Head and Neck Head-normocephalic, atraumatic with no lesions or palpable masses. Trachea-midline. Thyroid Gland Characteristics - normal size and consistency.  Eye Eyeball - Bilateral-Extraocular movements intact. Sclera/Conjunctiva - Bilateral-No scleral icterus.  Chest and Lung Exam Chest and lung exam reveals -quiet, even and easy respiratory effort with no use of accessory muscles and on auscultation, normal breath sounds, no adventitious sounds and normal vocal resonance. Inspection Chest Wall - Normal. Back - normal.  Cardiovascular Cardiovascular examination reveals -normal heart sounds, regular rate and rhythm with no murmurs and normal pedal pulses bilaterally.  Abdomen Inspection Inspection of the abdomen reveals - No Hernias. Palpation/Percussion Palpation and Percussion of the abdomen reveal - Soft, Non Tender, No Rebound tenderness, No Rigidity (guarding) and No hepatosplenomegaly. Auscultation Auscultation of the abdomen reveals - Bowel sounds normal. Note: Well-healed long transverse  suprapubic incision.   Neurologic Neurologic evaluation reveals -alert and oriented x 3 with no impairment of recent or remote memory. Mental Status-Normal.  Musculoskeletal Normal Exam - Left-Upper Extremity Strength Normal and Lower Extremity Strength Normal. Normal Exam - Right-Upper Extremity Strength Normal and Lower Extremity Strength Normal.  Lymphatic Head & Neck  General Head & Neck Lymphatics: Bilateral - Description - Normal. Axillary  General Axillary Region: Bilateral - Description - Normal. Tenderness - Non Tender. Femoral & Inguinal  Generalized Femoral & Inguinal Lymphatics: Bilateral - Description - Normal. Tenderness - Non Tender.    Assessment & Plan  GALLSTONES (574.20  K80.20) Current Plans  Schedule for Surgery The episode of right upper quadrant pain and nausea was almost certainly due to your gallstones. It is likely that this will continue to happen in the future. You also had a urinary tract infection, which seems to have been appropriately treated. You will be scheduled for laparoscopic cholecystectomy with cholangiogram, possible open cholecystectomy We have discussed the techniques and risks of this surgery in detail.  BMI 32.0-32.9,ADULT (V85.32  Z68.32) HYPERTENSION, BENIGN (401.1  I10) HISTORY OF HYSTERECTOMY (V88.01  Z90.710) URINARY TRACT INFECTION, ACUTE (599.0  N39.0) Impression: Treated with antibiotics 4 weeks ago. GOUT, ARTHRITIS (274.00  M10.9) HISTORY OF LUMBAR LAMINECTOMY (V45.89  Z98.89)   Edsel Petrin. Dalbert Batman, M.D., [facility] General and Minimally invasive Surgery Breast and Colorectal Surgery Office:   [phone] Pager:   [phone]

## 2014-06-25 NOTE — Patient Instructions (Addendum)
DERIN MATTHES  [birth year]   Your procedure is scheduled on: Friday January 22nd, 2016  Report to [facility] Main  Entrance and follow signs to               [facility] at 930 AM.  Call this number if you have problems the morning of surgery [phone]   Remember:  Do not eat food or drink liquids :After Midnight.     Take these medicines the morning of surgery with A SIP OF WATER: allopurinol                               You may not have any metal on your body including hair pins and              piercings  Do not wear jewelry, make-up, lotions, powders or perfumes.             Do not wear nail polish.  Do not shave  48 hours prior to surgery.              Men may shave face and neck.   Do not bring valuables to the hospital. [place].  Contacts, dentures or bridgework may not be worn into surgery.  Leave suitcase in the car. After surgery it may be brought to your room.     Patients discharged the day of surgery will not be allowed to drive home.  Name and phone number of your driver: eli spouse cell [phone]  Special Instructions: N/A              Please read over the following fact sheets you were given: _____________________________________________________________________             [facility] - Preparing for Surgery Before surgery, you can play an important role.  Because skin is not sterile, your skin needs to be as free of germs as possible.  You can reduce the number of germs on your skin by washing with CHG (chlorahexidine gluconate) soap before surgery.  CHG is an antiseptic cleaner which kills germs and bonds with the skin to continue killing germs even after washing. Please DO NOT use if you have an allergy to CHG or antibacterial soaps.  If your skin becomes reddened/irritated stop using the CHG and inform your nurse when you arrive at Short Stay. Do not shave (including legs and  underarms) for at least 48 hours prior to the first CHG shower.  You may shave your face/neck. Please follow these instructions carefully:  1.  Shower with CHG Soap the night before surgery and the  morning of Surgery.  2.  If you choose to wash your hair, wash your hair first as usual with your  normal  shampoo.  3.  After you shampoo, rinse your hair and body thoroughly to remove the  shampoo.                           4.  Use CHG as you would any other liquid soap.  You can apply chg directly  to the skin and wash  Gently with a scrungie or clean washcloth.  5.  Apply the CHG Soap to your body ONLY FROM THE NECK DOWN.   Do not use on face/ open                           Wound or open sores. Avoid contact with eyes, ears mouth and genitals (private parts).                       Wash face,  Genitals (private parts) with your normal soap.             6.  Wash thoroughly, paying special attention to the area where your surgery  will be performed.  7.  Thoroughly rinse your body with warm water from the neck down.  8.  DO NOT shower/wash with your normal soap after using and rinsing off  the CHG Soap.                9.  Pat yourself dry with a clean towel.            10.  Wear clean pajamas.            11.  Place clean sheets on your bed the night of your first shower and do not  sleep with pets. Day of Surgery : Do not apply any lotions/deodorants the morning of surgery.  Please wear clean clothes to the hospital/surgery center.  FAILURE TO FOLLOW THESE INSTRUCTIONS MAY RESULT IN THE CANCELLATION OF YOUR SURGERY PATIENT SIGNATURE_________________________________  NURSE SIGNATURE__________________________________  ________________________________________________________________________

## 2014-06-26 ENCOUNTER — Encounter (HOSPITAL_COMMUNITY): Payer: Self-pay

## 2014-06-26 ENCOUNTER — Ambulatory Visit (HOSPITAL_COMMUNITY)
Admission: RE | Admit: 2014-06-26 | Discharge: 2014-06-26 | Disposition: A | Discharge status: Home / Self Care | Payer: Medicare Other | Source: Ambulatory Visit | Attending: Anesthesiology | Admitting: Anesthesiology

## 2014-06-26 ENCOUNTER — Encounter (HOSPITAL_COMMUNITY)
Admission: RE | Admit: 2014-06-26 | Discharge: 2014-06-26 | Disposition: A | Discharge status: Home / Self Care | Payer: Medicare Other | Source: Ambulatory Visit | Attending: General Surgery | Admitting: General Surgery

## 2014-06-26 ENCOUNTER — Encounter (INDEPENDENT_AMBULATORY_CARE_PROVIDER_SITE_OTHER): Payer: Self-pay

## 2014-06-26 DIAGNOSIS — I10 Essential (primary) hypertension: Secondary | ICD-10-CM | POA: Insufficient documentation

## 2014-06-26 DIAGNOSIS — R05 Cough: Secondary | ICD-10-CM | POA: Diagnosis not present

## 2014-06-26 DIAGNOSIS — Z01818 Encounter for other preprocedural examination: Secondary | ICD-10-CM | POA: Diagnosis not present

## 2014-06-26 DIAGNOSIS — Z87891 Personal history of nicotine dependence: Secondary | ICD-10-CM | POA: Diagnosis not present

## 2014-06-26 HISTORY — DX: Reserved for concepts with insufficient information to code with codable children: IMO0002

## 2014-06-26 LAB — URINE MICROSCOPIC-ADD ON

## 2014-06-26 LAB — CBC WITH DIFFERENTIAL/PLATELET
Basophils Absolute: 0 10*3/uL (ref 0.0–0.1)
Basophils Relative: 0 % (ref 0–1)
Eosinophils Absolute: 0.1 10*3/uL (ref 0.0–0.7)
Eosinophils Relative: 1 % (ref 0–5)
HEMATOCRIT: 44.2 % (ref 36.0–46.0)
Hemoglobin: 14.5 g/dL (ref 12.0–15.0)
LYMPHS PCT: 25 % (ref 12–46)
Lymphs Abs: 2 10*3/uL (ref 0.7–4.0)
MCH: 30.2 pg (ref 26.0–34.0)
MCHC: 32.8 g/dL (ref 30.0–36.0)
MCV: 92.1 fL (ref 78.0–100.0)
MONO ABS: 0.7 10*3/uL (ref 0.1–1.0)
Monocytes Relative: 9 % (ref 3–12)
NEUTROS ABS: 5.2 10*3/uL (ref 1.7–7.7)
NEUTROS PCT: 65 % (ref 43–77)
PLATELETS: 370 10*3/uL (ref 150–400)
RBC: 4.8 MIL/uL (ref 3.87–5.11)
RDW: 14.6 % (ref 11.5–15.5)
WBC: 7.9 10*3/uL (ref 4.0–10.5)

## 2014-06-26 LAB — URINALYSIS, ROUTINE W REFLEX MICROSCOPIC
Bilirubin Urine: NEGATIVE
Glucose, UA: NEGATIVE mg/dL
HGB URINE DIPSTICK: NEGATIVE
Ketones, ur: NEGATIVE mg/dL
NITRITE: NEGATIVE
PH: 7 (ref 5.0–8.0)
Protein, ur: NEGATIVE mg/dL
SPECIFIC GRAVITY, URINE: 1.015 (ref 1.005–1.030)
Urobilinogen, UA: 0.2 mg/dL (ref 0.0–1.0)

## 2014-06-26 LAB — COMPREHENSIVE METABOLIC PANEL
ALBUMIN: 4.2 g/dL (ref 3.5–5.2)
ALT: 24 U/L (ref 0–35)
AST: 29 U/L (ref 0–37)
Alkaline Phosphatase: 58 U/L (ref 39–117)
Anion gap: 7 (ref 5–15)
BUN: 12 mg/dL (ref 6–23)
CO2: 31 mmol/L (ref 19–32)
CREATININE: 0.85 mg/dL (ref 0.50–1.10)
Calcium: 10.5 mg/dL (ref 8.4–10.5)
Chloride: 103 mEq/L (ref 96–112)
GFR calc Af Amer: 76 mL/min — ABNORMAL LOW (ref >90)
GFR calc non Af Amer: 66 mL/min — ABNORMAL LOW (ref >90)
Glucose, Bld: 102 mg/dL — ABNORMAL HIGH (ref 70–99)
POTASSIUM: 4.6 mmol/L (ref 3.5–5.1)
Sodium: 141 mmol/L (ref 135–145)
TOTAL PROTEIN: 7.8 g/dL (ref 6.0–8.3)
Total Bilirubin: 0.6 mg/dL (ref 0.3–1.2)

## 2014-06-26 NOTE — Progress Notes (Signed)
Micro ua results routed by epic to dr Lamount Cohen

## 2014-06-27 NOTE — Progress Notes (Signed)
Patient notified to arrive to Short Stay at 0900. Verbalized understanding.

## 2014-06-28 ENCOUNTER — Ambulatory Visit (HOSPITAL_COMMUNITY): Payer: Medicare Other | Admitting: Anesthesiology

## 2014-06-28 ENCOUNTER — Encounter (HOSPITAL_COMMUNITY): Payer: Self-pay | Admitting: *Deleted

## 2014-06-28 ENCOUNTER — Encounter (HOSPITAL_COMMUNITY)
Admission: RE | Disposition: A | Discharge status: Home / Self Care | Payer: Self-pay | Source: Ambulatory Visit | Attending: General Surgery

## 2014-06-28 ENCOUNTER — Ambulatory Visit (HOSPITAL_COMMUNITY): Payer: Medicare Other

## 2014-06-28 ENCOUNTER — Ambulatory Visit (HOSPITAL_COMMUNITY)
Admission: RE | Admit: 2014-06-28 | Discharge: 2014-06-28 | Disposition: A | Discharge status: Home / Self Care | Payer: Medicare Other | Source: Ambulatory Visit | Attending: General Surgery | Admitting: General Surgery

## 2014-06-28 DIAGNOSIS — M109 Gout, unspecified: Secondary | ICD-10-CM | POA: Insufficient documentation

## 2014-06-28 DIAGNOSIS — K219 Gastro-esophageal reflux disease without esophagitis: Secondary | ICD-10-CM | POA: Diagnosis not present

## 2014-06-28 DIAGNOSIS — Z7982 Long term (current) use of aspirin: Secondary | ICD-10-CM | POA: Insufficient documentation

## 2014-06-28 DIAGNOSIS — E785 Hyperlipidemia, unspecified: Secondary | ICD-10-CM | POA: Diagnosis not present

## 2014-06-28 DIAGNOSIS — E780 Pure hypercholesterolemia: Secondary | ICD-10-CM | POA: Insufficient documentation

## 2014-06-28 DIAGNOSIS — I10 Essential (primary) hypertension: Secondary | ICD-10-CM | POA: Insufficient documentation

## 2014-06-28 DIAGNOSIS — Z87891 Personal history of nicotine dependence: Secondary | ICD-10-CM | POA: Diagnosis not present

## 2014-06-28 DIAGNOSIS — N390 Urinary tract infection, site not specified: Secondary | ICD-10-CM | POA: Insufficient documentation

## 2014-06-28 DIAGNOSIS — K8010 Calculus of gallbladder with chronic cholecystitis without obstruction: Secondary | ICD-10-CM | POA: Diagnosis not present

## 2014-06-28 DIAGNOSIS — K8020 Calculus of gallbladder without cholecystitis without obstruction: Secondary | ICD-10-CM

## 2014-06-28 DIAGNOSIS — Z419 Encounter for procedure for purposes other than remedying health state, unspecified: Secondary | ICD-10-CM

## 2014-06-28 DIAGNOSIS — Z6832 Body mass index (BMI) 32.0-32.9, adult: Secondary | ICD-10-CM | POA: Diagnosis not present

## 2014-06-28 HISTORY — PX: CHOLECYSTECTOMY: SHX55

## 2014-06-28 SURGERY — LAPAROSCOPIC CHOLECYSTECTOMY WITH INTRAOPERATIVE CHOLANGIOGRAM
Anesthesia: General | Site: Abdomen

## 2014-06-28 MED ORDER — LIDOCAINE HCL (CARDIAC) 20 MG/ML IV SOLN
INTRAVENOUS | Status: DC | PRN
  Administered 2014-06-28: 75 mg via INTRAVENOUS

## 2014-06-28 MED ORDER — BUPIVACAINE-EPINEPHRINE 0.25% -1:200000 IJ SOLN
INTRAMUSCULAR | Status: DC | PRN
  Administered 2014-06-28: 20 mL

## 2014-06-28 MED ORDER — HYDROMORPHONE HCL 1 MG/ML IJ SOLN: 0.2500 mg | INTRAMUSCULAR | Status: DC | PRN

## 2014-06-28 MED ORDER — SUCCINYLCHOLINE CHLORIDE 20 MG/ML IJ SOLN
INTRAMUSCULAR | Status: DC | PRN
  Administered 2014-06-28: 100 mg via INTRAVENOUS

## 2014-06-28 MED ORDER — CISATRACURIUM BESYLATE (PF) 10 MG/5ML IV SOLN
INTRAVENOUS | Status: DC | PRN
  Administered 2014-06-28: 10 mg via INTRAVENOUS

## 2014-06-28 MED ORDER — FENTANYL CITRATE 0.05 MG/ML IJ SOLN
INTRAMUSCULAR | Status: DC | PRN
  Administered 2014-06-28 (×2): 50 ug via INTRAVENOUS

## 2014-06-28 MED ORDER — HYDROCODONE-ACETAMINOPHEN 5-325 MG PO TABS: 1.0000 | ORAL_TABLET | Freq: Four times a day (QID) | ORAL | Status: DC | PRN

## 2014-06-28 MED ORDER — PROPOFOL 10 MG/ML IV BOLUS
INTRAVENOUS | Status: DC | PRN
  Administered 2014-06-28: 150 mg via INTRAVENOUS
  Administered 2014-06-28: 50 mg via INTRAVENOUS

## 2014-06-28 MED ORDER — CEFAZOLIN SODIUM-DEXTROSE 2-3 GM-% IV SOLR
2.0000 g | Freq: Once | INTRAVENOUS | Status: AC
  Administered 2014-06-28: 2 g via INTRAVENOUS

## 2014-06-28 MED ORDER — ATROPINE SULFATE 0.4 MG/ML IJ SOLN
INTRAMUSCULAR | Status: DC | PRN
  Administered 2014-06-28: 0.2 mg via INTRAVENOUS
  Administered 2014-06-28: .4 mg via INTRAVENOUS

## 2014-06-28 MED ORDER — BUPIVACAINE-EPINEPHRINE (PF) 0.5% -1:200000 IJ SOLN
INTRAMUSCULAR | Status: AC
  Filled 2014-06-28: qty 30

## 2014-06-28 MED ORDER — MIDAZOLAM HCL 5 MG/5ML IJ SOLN
INTRAMUSCULAR | Status: DC | PRN
  Administered 2014-06-28 (×2): .5 mg via INTRAVENOUS

## 2014-06-28 MED ORDER — ACETAMINOPHEN 325 MG PO TABS: 650.0000 mg | ORAL_TABLET | ORAL | Status: DC | PRN

## 2014-06-28 MED ORDER — PROPOFOL 10 MG/ML IV BOLUS
INTRAVENOUS | Status: AC
  Filled 2014-06-28: qty 20

## 2014-06-28 MED ORDER — NEOSTIGMINE METHYLSULFATE 10 MG/10ML IV SOLN
INTRAVENOUS | Status: DC | PRN
  Administered 2014-06-28: 5 mg via INTRAVENOUS

## 2014-06-28 MED ORDER — LACTATED RINGERS IV SOLN
INTRAVENOUS | Status: DC
  Administered 2014-06-28 (×3): via INTRAVENOUS

## 2014-06-28 MED ORDER — ACETAMINOPHEN 650 MG RE SUPP: 650.0000 mg | RECTAL | Status: DC | PRN

## 2014-06-28 MED ORDER — CEFAZOLIN SODIUM-DEXTROSE 2-3 GM-% IV SOLR: 2.0000 g | INTRAVENOUS | Status: DC

## 2014-06-28 MED ORDER — SODIUM CHLORIDE 0.9 % IJ SOLN: 3.0000 mL | Freq: Two times a day (BID) | INTRAMUSCULAR | Status: DC

## 2014-06-28 MED ORDER — DEXAMETHASONE SODIUM PHOSPHATE 10 MG/ML IJ SOLN
INTRAMUSCULAR | Status: DC | PRN
  Administered 2014-06-28: 10 mg via INTRAVENOUS

## 2014-06-28 MED ORDER — PROMETHAZINE HCL 25 MG/ML IJ SOLN: 6.2500 mg | INTRAMUSCULAR | Status: DC | PRN

## 2014-06-28 MED ORDER — FENTANYL CITRATE 0.05 MG/ML IJ SOLN: 25.0000 ug | INTRAMUSCULAR | Status: DC | PRN

## 2014-06-28 MED ORDER — LACTATED RINGERS IV SOLN
INTRAVENOUS | Status: DC
  Administered 2014-06-28: 1000 mL via INTRAVENOUS

## 2014-06-28 MED ORDER — SODIUM CHLORIDE 0.9 % IJ SOLN: 3.0000 mL | INTRAMUSCULAR | Status: DC | PRN

## 2014-06-28 MED ORDER — OXYCODONE HCL 5 MG PO TABS: 5.0000 mg | ORAL_TABLET | ORAL | Status: DC | PRN

## 2014-06-28 MED ORDER — ONDANSETRON HCL 4 MG/2ML IJ SOLN
INTRAMUSCULAR | Status: DC | PRN
  Administered 2014-06-28 (×2): 2 mg via INTRAVENOUS

## 2014-06-28 MED ORDER — SODIUM CHLORIDE 0.9 % IV SOLN: 250.0000 mL | INTRAVENOUS | Status: DC | PRN

## 2014-06-28 MED ORDER — SODIUM CHLORIDE 0.9 % IV SOLN: INTRAVENOUS | Status: DC

## 2014-06-28 MED ORDER — FENTANYL CITRATE 0.05 MG/ML IJ SOLN
INTRAMUSCULAR | Status: AC
  Filled 2014-06-28: qty 5

## 2014-06-28 MED ORDER — GLYCOPYRROLATE 0.2 MG/ML IJ SOLN
INTRAMUSCULAR | Status: DC | PRN
  Administered 2014-06-28: 0.2 mg via INTRAVENOUS
  Administered 2014-06-28: 0.4 mg via INTRAVENOUS
  Administered 2014-06-28 (×2): 0.2 mg via INTRAVENOUS
  Administered 2014-06-28: 0.4 mg via INTRAVENOUS

## 2014-06-28 MED ORDER — MIDAZOLAM HCL 2 MG/2ML IJ SOLN
INTRAMUSCULAR | Status: AC
  Filled 2014-06-28: qty 2

## 2014-06-28 SURGICAL SUPPLY — 31 items
APL SKNCLS STERI-STRIP NONHPOA (GAUZE/BANDAGES/DRESSINGS)
APPLIER CLIP ROT 10 11.4 M/L (STAPLE) ×3
APR CLP MED LRG 11.4X10 (STAPLE) ×1
BAG SPEC RTRVL LRG 6X4 10 (ENDOMECHANICALS) ×1
BENZOIN TINCTURE PRP APPL 2/3 (GAUZE/BANDAGES/DRESSINGS) IMPLANT
CLIP APPLIE ROT 10 11.4 M/L (STAPLE) ×1 IMPLANT
CLOSURE WOUND 1/2 X4 (GAUZE/BANDAGES/DRESSINGS)
COVER MAYO STAND STRL (DRAPES) ×3 IMPLANT
DECANTER SPIKE VIAL GLASS SM (MISCELLANEOUS) ×3 IMPLANT
DRAPE C-ARM 42X120 X-RAY (DRAPES) ×3 IMPLANT
DRAPE LAPAROSCOPIC ABDOMINAL (DRAPES) ×3 IMPLANT
ELECT REM PT RETURN 9FT ADLT (ELECTROSURGICAL) ×3
ELECTRODE REM PT RTRN 9FT ADLT (ELECTROSURGICAL) ×1 IMPLANT
GLOVE EUDERMIC 7 POWDERFREE (GLOVE) ×3 IMPLANT
GOWN STRL REUS W/TWL XL LVL3 (GOWN DISPOSABLE) ×12 IMPLANT
HEMOSTAT SNOW SURGICEL 2X4 (HEMOSTASIS) IMPLANT
KIT BASIN OR (CUSTOM PROCEDURE TRAY) ×3 IMPLANT
LIQUID BAND (GAUZE/BANDAGES/DRESSINGS) ×3 IMPLANT
POUCH SPECIMEN RETRIEVAL 10MM (ENDOMECHANICALS) ×3 IMPLANT
SCISSORS LAP 5X35 DISP (ENDOMECHANICALS) ×3 IMPLANT
SET CHOLANGIOGRAPH MIX (MISCELLANEOUS) ×3 IMPLANT
SET IRRIG TUBING LAPAROSCOPIC (IRRIGATION / IRRIGATOR) ×3 IMPLANT
SLEEVE XCEL OPT CAN 5 100 (ENDOMECHANICALS) ×3 IMPLANT
STRIP CLOSURE SKIN 1/2X4 (GAUZE/BANDAGES/DRESSINGS) IMPLANT
SUT MNCRL AB 4-0 PS2 18 (SUTURE) ×3 IMPLANT
TOWEL OR 17X26 10 PK STRL BLUE (TOWEL DISPOSABLE) ×3 IMPLANT
TOWEL OR NON WOVEN STRL DISP B (DISPOSABLE) ×3 IMPLANT
TRAY LAPAROSCOPIC (CUSTOM PROCEDURE TRAY) ×3 IMPLANT
TROCAR BLADELESS OPT 5 100 (ENDOMECHANICALS) ×3 IMPLANT
TROCAR XCEL BLUNT TIP 100MML (ENDOMECHANICALS) ×3 IMPLANT
TROCAR XCEL NON-BLD 11X100MML (ENDOMECHANICALS) ×3 IMPLANT

## 2014-06-28 NOTE — Discharge Instructions (Signed)
[facility] ______[facility] °LAPAROSCOPIC SURGERY: POST OP INSTRUCTIONS °Always review your discharge instruction sheet given to you by the facility where your surgery was performed. °IF YOU HAVE DISABILITY OR FAMILY LEAVE FORMS, YOU MUST BRING THEM TO THE OFFICE FOR PROCESSING.   °DO NOT GIVE THEM TO YOUR DOCTOR. ° °1. A prescription for pain medication may be given to you upon discharge.  Take your pain medication as prescribed, if needed.  If narcotic pain medicine is not needed, then you may take acetaminophen (Tylenol) or ibuprofen (Advil) as needed. °2. Take your usually prescribed medications unless otherwise directed. °3. If you need a refill on your pain medication, please contact your pharmacy.  They will contact our office to request authorization. Prescriptions will not be filled after 5pm or on week-ends. °4. You should follow a light diet the first few days after arrival home, such as soup and crackers, etc.  Be sure to include lots of fluids daily. °5. Most patients will experience some swelling and bruising in the area of the incisions.  Ice packs will help.  Swelling and bruising can take several days to resolve.  °6. It is common to experience some constipation if taking pain medication after surgery.  Increasing fluid intake and taking a stool softener (such as Colace) will usually help or prevent this problem from occurring.  A mild laxative (Milk of Magnesia or Miralax) should be taken according to package instructions if there are no bowel movements after 48 hours. °7. Unless discharge instructions indicate otherwise, you may remove your bandages 24-48 hours after surgery, and you may shower at that time.  You may have steri-strips (small skin tapes) in place directly over the incision.  These strips should be left on the skin for 7-10 days.  If your surgeon used skin glue on the incision, you may shower in 24 hours.  The glue will flake off over the next 2-3 weeks.  Any sutures or  staples will be removed at the office during your follow-up visit. °8. ACTIVITIES:  You may resume regular (light) daily activities beginning the next day--such as daily self-care, walking, climbing stairs--gradually increasing activities as tolerated.  You may have sexual intercourse when it is comfortable.  Refrain from any heavy lifting or straining until approved by your doctor. °a. You may drive when you are no longer taking prescription pain medication, you can comfortably wear a seatbelt, and you can safely maneuver your car and apply brakes. °b. RETURN TO WORK:  __________________________________________________________ °9. You should see your doctor in the office for a follow-up appointment approximately 2-3 weeks after your surgery.  Make sure that you call for this appointment within a day or two after you arrive home to insure a convenient appointment time. °10. OTHER INSTRUCTIONS: __________________________________________________________________________________________________________________________ __________________________________________________________________________________________________________________________ °WHEN TO CALL YOUR DOCTOR: °1. Fever over 101.0 °2. Inability to urinate °3. Continued bleeding from incision. °4. Increased pain, redness, or drainage from the incision. °5. Increasing abdominal pain ° °The clinic staff is available to answer your questions during regular business hours.  Please don’t hesitate to call and ask to speak to one of the nurses for clinical concerns.  If you have a medical emergency, go to the nearest emergency room or call 911.  A surgeon from [facility] is always on call at the hospital. °[address], [address], [place], [place]  [address] ? [address] [address], [place], [place]   [address] °[phone] ? [phone] ? FAX [phone] °Web site:   [email or web address] °

## 2014-06-28 NOTE — Anesthesia Preprocedure Evaluation (Addendum)
Anesthesia Evaluation  Patient identified by MRN, date of birth, ID band Patient awake    Reviewed: Allergy & Precautions, NPO status , Patient's Chart, lab work & pertinent test results  Airway Mallampati: II  TM Distance: >3 FB Neck ROM: Full    Dental no notable dental hx.    Pulmonary neg pulmonary ROS,  breath sounds clear to auscultation  Pulmonary exam normal       Cardiovascular Exercise Tolerance: Good hypertension, Pt. on medications Rhythm:Regular Rate:Normal     Neuro/Psych negative neurological ROS  negative psych ROS   GI/Hepatic Neg liver ROS, GERD-  Medicated,  Endo/Other  negative endocrine ROS  Renal/GU negative Renal ROS  negative genitourinary   Musculoskeletal negative musculoskeletal ROS (+)   Abdominal   Peds negative pediatric ROS (+)  Hematology negative hematology ROS (+)   Anesthesia Other Findings   Reproductive/Obstetrics negative OB ROS                           Anesthesia Physical Anesthesia Plan  ASA: II  Anesthesia Plan: General   Post-op Pain Management:    Induction: Intravenous  Airway Management Planned: Oral ETT  Additional Equipment:   Intra-op Plan:   Post-operative Plan: Extubation in OR  Informed Consent: I have reviewed the patients History and Physical, chart, labs and discussed the procedure including the risks, benefits and alternatives for the proposed anesthesia with the patient or authorized representative who has indicated his/her understanding and acceptance.   Dental advisory given  Plan Discussed with: CRNA  Anesthesia Plan Comments:         Anesthesia Quick Evaluation

## 2014-06-28 NOTE — Anesthesia Procedure Notes (Signed)
Date/Time: 06/28/2014 12:17 PM Performed by: Ofilia Neas Pre-anesthesia Checklist: Patient identified, Timeout performed, Emergency Drugs available, Suction available and Patient being monitored Patient Re-evaluated:Patient Re-evaluated prior to inductionOxygen Delivery Method: Circle system utilized Preoxygenation: Pre-oxygenation with 100% oxygen Intubation Type: IV induction Ventilation: Mask ventilation without difficulty Laryngoscope Size: Mac and 4 Grade View: Grade II Tube type: Oral Tube size: 7.5 mm Number of attempts: 1 Airway Equipment and Method: Stylet Placement Confirmation: ETT inserted through vocal cords under direct vision,  positive ETCO2 and breath sounds checked- equal and bilateral Secured at: 21 cm Tube secured with: Tape Dental Injury: Teeth and Oropharynx as per pre-operative assessment

## 2014-06-28 NOTE — Interval H&P Note (Signed)
History and Physical Interval Note:  7/98/9211 94:17 AM  Caitlyn Boone  has presented today for surgery, with the diagnosis of gallstones  The various methods of treatment have been discussed with the patient and family. After consideration of risks, benefits and other options for treatment, the patient has consented to  Procedure(s): LAPAROSCOPIC CHOLECYSTECTOMY WITH INTRAOPERATIVE CHOLANGIOGRAM POSSIBLE OPEN (N/A) as a surgical intervention .  The patient's history has been reviewed, patient examined today, no change in status, stable for surgery.  I have reviewed the patient's chart and labs.  Questions were answered to the patient's satisfaction.     Adin Hector

## 2014-06-28 NOTE — Transfer of Care (Signed)
Immediate Anesthesia Transfer of Care Note  Patient: Caitlyn Boone  Procedure(s) Performed: Procedure(s): LAPAROSCOPIC CHOLECYSTECTOMY  (N/A)  Patient Location: PACU  Anesthesia Type:General  Level of Consciousness: awake, alert , oriented, patient cooperative and responds to stimulation  Airway & Oxygen Therapy: Patient Spontanous Breathing and Patient connected to face mask oxygen  Post-op Assessment: Report given to PACU RN, Post -op Vital signs reviewed and stable and Patient moving all extremities  Post vital signs: Reviewed and stable  Complications: No apparent anesthesia complications

## 2014-06-28 NOTE — Op Note (Signed)
Patient Name:           Caitlyn Boone   Date of Surgery:        06/28/2014  Pre op Diagnosis:      Chronic cholecystitis with cholelithiasis  Post op Diagnosis:    Same  Procedure:              Laparoscopic cholecystectomy  Surgeon:                     Edsel Petrin. Dalbert Batman, M.D., FACS  Assistant:                      Armandina Gemma, M.D.  Operative Indications:   . This is a very pleasant 74 year old Caucasian female from [place]. Dr. Morrie Sheldon is her PCP. She is basically self-referred for management of symptomatic gallstones. I have operated on her husband with exploratory laparotomy many years ago. On May 21, 2014 she developed right upper quadrant pain, fever, chills, nausea, and right flank pain. She was seen at [facility] family practice and, according to her, sent to the emergency department because of fever. She was not having any chest pain or shortness of breath. Lab work revealed calcium 11.1, urinary tract infection. Normal LFTs.Ultrasound showed multiple gallstones but no signs of inflammation. CBD 3 mm. Kidneys showed slightly prominent cortical echogenicity but no hydronephrosis. She states she was given antibiotics for her urinary tract infection and was told she needed to have a cholecystectomy. Since that time she has had some postprandial nausea but no more pain. She was evaluated in the office. She is brought to operating room electively. Comorbidities include BMI 32, hypertension, gout, history of abdominal hysterectomy, history of lumbar laminectomy, hyperlipidemia. She has no history of cardiac pulmonary or renal disease.  Operative Findings:       The gallbladder was chronically inflamed, discolored with moderate adhesions. Thin-walled. The anatomy of the cystic duct cystic artery and common bile duct appeared conventional. A good critical view was created. The liver, stomach, duodenum, small intestine, large intestine were grossly normal to  inspection.  Procedure in Detail:          Following the induction of general endotracheal anesthesia the patient's abdomen was prepped and draped in a sterile fashion. Surgical timeout was performed. Intravenous antibiotics were given. 0.5% Marcaine with epinephrine was used as local infiltration anesthetic.     A vertical incision was made in the lower rim of the umbilicus and the fascia was incised in the midline and the abdominal cavity entered under direct vision. An 11 mm Hassan trocar was inserted and secured with the Purstring suture and pneumoperitoneum was created. Video camera was inserted. An 11 mm trochars placed in subxiphoid region and two 5  mm trochars in the right upper quadrant. Gallbladder fundus was elevated. Adhesions were taken down. I incised the peritoneum over the neck of the gallbladder. Dissected out the cystic duct and the cystic artery. I created a very large window behind these 2 structures and there were only 2 structures seen going to the gallbladder. The cystic duct and the cystic artery were secured with metal clips and divided. Gallbladder was dissected from its bed with electrocautery, placed in a specimen bag and removed. The operative field was copiously irrigated. Hemostasis was excellent. There was no bile leak. Pneumoperitoneum was released. The trochars were removed. The fascia at the umbilicus closed with 0 Vicryl sutures and the skin incisions closed with  subcuticular 4-0 Monocryl and Dermabond. Patient tolerated the procedure well was taken to PACU in stable condition. EBL 10 mL. Counts correct. Complications none     Tameeka Luo M. Dalbert Batman, M.D., FACS General and Minimally Invasive Surgery Breast and Colorectal Surgery  06/28/2014 1:04 PM

## 2014-06-28 NOTE — Anesthesia Postprocedure Evaluation (Signed)
  Anesthesia Post-op Note  Patient: Caitlyn Boone  Procedure(s) Performed: Procedure(s) (LRB): LAPAROSCOPIC CHOLECYSTECTOMY  (N/A)  Patient Location: PACU  Anesthesia Type: General  Level of Consciousness: awake and alert   Airway and Oxygen Therapy: Patient Spontanous Breathing  Post-op Pain: mild  Post-op Assessment: Post-op Vital signs reviewed, Patient's Cardiovascular Status Stable, Respiratory Function Stable, Patent Airway and No signs of Nausea or vomiting  Last Vitals:  Filed Vitals:   06/28/14 1455  BP: 153/30  Pulse: 64  Temp: 36.6 C  Resp: 12    Post-op Vital Signs: stable   Complications: No apparent anesthesia complications

## 2014-07-01 ENCOUNTER — Encounter (HOSPITAL_COMMUNITY): Payer: Self-pay | Admitting: General Surgery

## 2014-07-17 ENCOUNTER — Other Ambulatory Visit: Payer: Self-pay

## 2014-07-17 DIAGNOSIS — Z1231 Encounter for screening mammogram for malignant neoplasm of breast: Secondary | ICD-10-CM

## 2014-08-07 ENCOUNTER — Encounter: Payer: Self-pay | Admitting: Gastroenterology

## 2014-08-12 ENCOUNTER — Ambulatory Visit
Admission: RE | Admit: 2014-08-12 | Discharge: 2014-08-12 | Disposition: A | Discharge status: Home / Self Care | Payer: Medicare Other | Source: Ambulatory Visit

## 2014-08-12 DIAGNOSIS — Z1231 Encounter for screening mammogram for malignant neoplasm of breast: Secondary | ICD-10-CM | POA: Diagnosis not present

## 2014-08-22 DIAGNOSIS — H40013 Open angle with borderline findings, low risk, bilateral: Secondary | ICD-10-CM | POA: Diagnosis not present

## 2014-08-23 ENCOUNTER — Telehealth: Payer: Self-pay | Admitting: Family Medicine

## 2014-08-23 MED ORDER — PRAVASTATIN SODIUM 80 MG PO TABS: 80.0000 mg | ORAL_TABLET | Freq: Every evening | ORAL | Status: DC

## 2014-08-23 NOTE — Telephone Encounter (Signed)
done

## 2014-09-10 ENCOUNTER — Other Ambulatory Visit: Payer: Self-pay | Admitting: *Deleted

## 2014-09-10 MED ORDER — RANITIDINE HCL 300 MG PO CAPS: 300.0000 mg | ORAL_CAPSULE | Freq: Every evening | ORAL | Status: DC

## 2014-10-04 ENCOUNTER — Other Ambulatory Visit: Payer: Self-pay | Admitting: *Deleted

## 2014-10-04 ENCOUNTER — Telehealth: Payer: Self-pay | Admitting: Family Medicine

## 2014-10-04 MED ORDER — LISINOPRIL-HYDROCHLOROTHIAZIDE 20-25 MG PO TABS: 1.0000 | ORAL_TABLET | Freq: Every day | ORAL | Status: DC

## 2014-10-17 ENCOUNTER — Other Ambulatory Visit: Payer: Self-pay | Admitting: Family Medicine

## 2014-10-29 ENCOUNTER — Encounter: Payer: Self-pay | Admitting: Family Medicine

## 2014-10-29 ENCOUNTER — Ambulatory Visit (INDEPENDENT_AMBULATORY_CARE_PROVIDER_SITE_OTHER): Payer: Medicare Other | Admitting: Family Medicine

## 2014-10-29 ENCOUNTER — Ambulatory Visit (INDEPENDENT_AMBULATORY_CARE_PROVIDER_SITE_OTHER): Payer: Medicare Other

## 2014-10-29 VITALS — BP 153/87 | HR 54 | Temp 97.0°F | Ht 64.5 in | Wt 190.0 lb

## 2014-10-29 DIAGNOSIS — I10 Essential (primary) hypertension: Secondary | ICD-10-CM | POA: Diagnosis not present

## 2014-10-29 DIAGNOSIS — Z780 Asymptomatic menopausal state: Secondary | ICD-10-CM

## 2014-10-29 DIAGNOSIS — K219 Gastro-esophageal reflux disease without esophagitis: Secondary | ICD-10-CM

## 2014-10-29 DIAGNOSIS — E785 Hyperlipidemia, unspecified: Secondary | ICD-10-CM

## 2014-10-29 MED ORDER — RANITIDINE HCL 300 MG PO CAPS: 300.0000 mg | ORAL_CAPSULE | Freq: Every evening | ORAL | Status: DC

## 2014-10-29 MED ORDER — LISINOPRIL-HYDROCHLOROTHIAZIDE 20-25 MG PO TABS: 1.0000 | ORAL_TABLET | Freq: Every day | ORAL | Status: DC

## 2014-10-29 MED ORDER — ALENDRONATE SODIUM 70 MG PO TABS: 70.0000 mg | ORAL_TABLET | ORAL | Status: DC

## 2014-10-29 NOTE — Progress Notes (Signed)
Subjective:    Patient ID: Caitlyn Boone, female    DOB: 06/03/1941, 74 y.o.   MRN: [id number]  HPI  74 year old female here to follow-up hypertension, GERD, osteoporosis, and hyperlipidemia. She takes a statin for her lipids but it has not been checked since she is transferred back here from another provider in medicine. She reports no problems with statin therapy.  She also takes Fosamax calcium and vitamin D for osteoporosis but has not had a bone density in 3-4 years by history. This should be checked and will be done today.  She complains mostly today of her knees especially the right knee. She has been seen by orthopedics has injections but she is not a candidate for joint replacement by her history. Currently she is using horse liniment that she rubs on her knees with some relief.    Review of Systems  Constitutional: Negative.   HENT: Negative.   Respiratory: Negative.   Cardiovascular: Negative.   Genitourinary: Negative.   Musculoskeletal: Positive for arthralgias.  Neurological: Negative.    Patient Active Problem List   Diagnosis Date Noted  . Gallstones 06/28/2014  . Hyperlipidemia 02/19/2014  . Osteoporosis, unspecified 02/19/2014  . Essential hypertension 02/19/2014  . GERD (gastroesophageal reflux disease) 02/19/2014  . Gout 02/19/2014   Outpatient Encounter Prescriptions as of 10/29/2014  Medication Sig  . alendronate (FOSAMAX) 70 MG tablet Take 70 mg by mouth once a week. Take with a full glass of water on an empty stomach.  Marland Kitchen allopurinol (ZYLOPRIM) 300 MG tablet Take 300 mg by mouth daily.  Marland Kitchen aspirin EC 81 MG tablet Take 81 mg by mouth daily.  . calcium citrate (CALCITRATE - DOSED IN MG ELEMENTAL CALCIUM) 950 MG tablet Take 200 mg of elemental calcium by mouth daily.  . Cholecalciferol (VITAMIN D) 2000 UNITS CAPS Take 1 capsule by mouth daily.  Marland Kitchen lisinopril-hydrochlorothiazide (PRINZIDE,ZESTORETIC) 20-25 MG per tablet Take 1 tablet by mouth daily.  . Multiple  Vitamin (MULTIVITAMIN WITH MINERALS) TABS tablet Take 1 tablet by mouth daily.  . pravastatin (PRAVACHOL) 80 MG tablet TAKE ONE TABLET BY MOUTH ONCE DAILY IN THE EVENING  . ranitidine (ZANTAC) 300 MG capsule Take 1 capsule (300 mg total) by mouth every evening.  . [DISCONTINUED] docusate sodium (COLACE) 100 MG capsule Take 100 mg by mouth daily as needed for mild constipation.  . [DISCONTINUED] HYDROcodone-acetaminophen (NORCO) 5-325 MG per tablet Take 1-2 tablets by mouth every 6 (six) hours as needed.  . [DISCONTINUED] HYDROcodone-acetaminophen (NORCO/VICODIN) 5-325 MG per tablet Take 1-2 tablets by mouth every 6 (six) hours as needed for moderate pain.  . [DISCONTINUED] ibuprofen (ADVIL,MOTRIN) 200 MG tablet Take 400 mg by mouth every 6 (six) hours as needed (Pain).  . [DISCONTINUED] ondansetron (ZOFRAN ODT) 8 MG disintegrating tablet Take 1 tablet (8 mg total) by mouth every 8 (eight) hours as needed for nausea or vomiting.   No facility-administered encounter medications on file as of 10/29/2014.       Objective:   Physical Exam  Constitutional: She is oriented to person, place, and time. She appears well-developed and well-nourished.  Cardiovascular: Normal rate and regular rhythm.   Pulmonary/Chest: Effort normal and breath sounds normal.  Neurological: She is alert and oriented to person, place, and time.  Psychiatric: She has a normal mood and affect. Her behavior is normal.          Assessment & Plan:  1. Essential hypertension Blood pressure is fairly well controlled. Would like to see systolic  a little bit lower but I don't think mild elevation warrants changed today  2. Gastroesophageal reflux disease, esophagitis presence not specified She has been on Zantac for greater than 20 years. It works well for her GERD  3. Hyperlipidemia She is on significant dose of statin and this needs to be monitored along with liver function studies - Lipid panel - CMP14+EGFR  4.  Post-menopausal Takes bisphosphonate will plan to monitor every other year - DG Bone Density; Future

## 2014-10-30 LAB — CMP14+EGFR
A/G RATIO: 1.4 (ref 1.1–2.5)
ALBUMIN: 4.4 g/dL (ref 3.5–4.8)
ALK PHOS: 57 IU/L (ref 39–117)
ALT: 24 IU/L (ref 0–32)
AST: 25 IU/L (ref 0–40)
BUN/Creatinine Ratio: 17 (ref 11–26)
BUN: 13 mg/dL (ref 8–27)
Bilirubin Total: 0.2 mg/dL (ref 0.0–1.2)
CALCIUM: 10.7 mg/dL — AB (ref 8.7–10.3)
CO2: 27 mmol/L (ref 18–29)
Chloride: 98 mmol/L (ref 97–108)
Creatinine, Ser: 0.75 mg/dL (ref 0.57–1.00)
GFR calc Af Amer: 91 mL/min/{1.73_m2} (ref >59)
GFR calc non Af Amer: 79 mL/min/{1.73_m2} (ref >59)
GLOBULIN, TOTAL: 3.1 g/dL (ref 1.5–4.5)
GLUCOSE: 72 mg/dL (ref 65–99)
POTASSIUM: 3.8 mmol/L (ref 3.5–5.2)
Sodium: 143 mmol/L (ref 134–144)
TOTAL PROTEIN: 7.5 g/dL (ref 6.0–8.5)

## 2014-10-30 LAB — LIPID PANEL
Chol/HDL Ratio: 3.9 ratio units (ref 0.0–4.4)
Cholesterol, Total: 207 mg/dL — ABNORMAL HIGH (ref 100–199)
HDL: 53 mg/dL (ref >39)
LDL CALC: 115 mg/dL — AB (ref 0–99)
TRIGLYCERIDES: 196 mg/dL — AB (ref 0–149)
VLDL Cholesterol Cal: 39 mg/dL (ref 5–40)

## 2014-11-05 NOTE — Progress Notes (Signed)
Lipids (LDL) up slightly; does not ned Rx; chem are okay

## 2014-11-19 ENCOUNTER — Other Ambulatory Visit: Payer: Self-pay | Admitting: Family Medicine

## 2014-11-21 DIAGNOSIS — H40013 Open angle with borderline findings, low risk, bilateral: Secondary | ICD-10-CM | POA: Diagnosis not present

## 2014-12-12 ENCOUNTER — Other Ambulatory Visit: Payer: Self-pay

## 2014-12-12 MED ORDER — ALLOPURINOL 300 MG PO TABS: 300.0000 mg | ORAL_TABLET | Freq: Every day | ORAL | Status: DC

## 2015-01-21 ENCOUNTER — Encounter: Payer: Self-pay | Admitting: *Deleted

## 2015-02-11 ENCOUNTER — Other Ambulatory Visit: Payer: Self-pay | Admitting: Family Medicine

## 2015-03-07 ENCOUNTER — Other Ambulatory Visit: Payer: Self-pay | Admitting: Family Medicine

## 2015-03-25 ENCOUNTER — Encounter: Payer: Self-pay | Admitting: Family Medicine

## 2015-03-31 ENCOUNTER — Ambulatory Visit (INDEPENDENT_AMBULATORY_CARE_PROVIDER_SITE_OTHER): Payer: Medicare Other | Admitting: Pediatrics

## 2015-03-31 ENCOUNTER — Encounter: Payer: Self-pay | Admitting: Pediatrics

## 2015-03-31 DIAGNOSIS — J019 Acute sinusitis, unspecified: Secondary | ICD-10-CM | POA: Diagnosis not present

## 2015-03-31 DIAGNOSIS — M810 Age-related osteoporosis without current pathological fracture: Secondary | ICD-10-CM

## 2015-03-31 MED ORDER — ALENDRONATE SODIUM 70 MG PO TABS: 70.0000 mg | ORAL_TABLET | ORAL | Status: DC

## 2015-03-31 MED ORDER — AZITHROMYCIN 250 MG PO TABS: ORAL_TABLET | ORAL | Status: DC

## 2015-03-31 NOTE — Progress Notes (Signed)
Subjective:    Patient ID: Caitlyn Boone, female    DOB: 02-13-1941, 74 y.o.   MRN: [id number]  CC: cough  HPI: Caitlyn Boone is a 74 y.o. female presenting on 03/31/2015 for Cough  Started 7-8 days ago with a scratchy throat. Coughing up greenish yellow things. Lots of congestion Coughing a lot Using OTC cold medicines Tylenol cold tablets sometimes, not helping. No fevers Appetite has been ok  Relevant past medical, surgical, family and social history reviewed and updated as indicated. Interim medical history since our last visit reviewed. Allergies and medications reviewed and updated.   ROS: Per HPI unless specifically indicated above  Past Medical History Patient Active Problem List   Diagnosis Date Noted  . Gallstones 06/28/2014  . Hyperlipidemia 02/19/2014  . Osteoporosis 02/19/2014  . Essential hypertension 02/19/2014  . GERD (gastroesophageal reflux disease) 02/19/2014  . Gout 02/19/2014    Current Outpatient Prescriptions  Medication Sig Dispense Refill  . alendronate (FOSAMAX) 70 MG tablet Take 1 tablet (70 mg total) by mouth once a week. Take with a full glass of water on an empty stomach. 12 tablet 1  . allopurinol (ZYLOPRIM) 300 MG tablet TAKE ONE TABLET BY MOUTH ONCE DAILY 30 tablet 1  . aspirin EC 81 MG tablet Take 81 mg by mouth daily.    . calcium citrate (CALCITRATE - DOSED IN MG ELEMENTAL CALCIUM) 950 MG tablet Take 200 mg of elemental calcium by mouth daily.    . Cholecalciferol (VITAMIN D) 2000 UNITS CAPS Take 1 capsule by mouth daily.    Marland Kitchen lisinopril-hydrochlorothiazide (PRINZIDE,ZESTORETIC) 20-25 MG per tablet Take 1 tablet by mouth daily. 90 tablet 1  . Multiple Vitamin (MULTIVITAMIN WITH MINERALS) TABS tablet Take 1 tablet by mouth daily.    . pravastatin (PRAVACHOL) 80 MG tablet TAKE ONE TABLET BY MOUTH ONCE DAILY IN THE EVENING 30 tablet 4  . ranitidine (ZANTAC) 300 MG capsule Take 1 capsule (300 mg total) by mouth every evening. 90 capsule 1   . azithromycin (ZITHROMAX) 250 MG tablet Take 2 the first day and then one each day after. 6 tablet 0   No current facility-administered medications for this visit.       Objective:    BP 130/79 mmHg  Pulse 61  Temp(Src) 97.2 F (36.2 C) (Oral)  Ht 5' 4.5" (1.638 m)  Wt 190 lb 3.2 oz (86.274 kg)  BMI 32.16 kg/m2  Wt Readings from Last 3 Encounters:  03/31/15 190 lb 3.2 oz (86.274 kg)  10/29/14 190 lb (86.183 kg)  06/28/14 187 lb (84.823 kg)    Gen: NAD, alert, congested EYES: EOMI, no scleral injection or icterus ENT:  TMs slightly erythematous b/l, OP with mild erythema, tender over sinuses LYMPH: no cervical LAD CV: NRRR, normal S1/S2, no murmur, distal pulses 2+ b/l Resp: CTABL, no wheezes, normal WOB Abd: +BS, soft, NTND. no guarding or organomegaly Ext: No edema, warm Neuro: Alert and oriented    Assessment & Plan:   Caitlyn Boone was seen today for cough, has tenderness over sinuses, ongoing for 7-8 days, will treat with abx as below. Also needed refill on alendronate. No side effects from it, tolerating the medicine well.  Diagnoses and all orders for this visit:  Osteoporosis -     alendronate (FOSAMAX) 70 MG tablet; Take 1 tablet (70 mg total) by mouth once a week. Take with a full glass of water on an empty stomach.  Acute sinusitis, recurrence not specified, unspecified location -  azithromycin (ZITHROMAX) 250 MG tablet; Take 2 the first day and then one each day after.    Follow up plan: Return if symptoms worsen or fail to improve.  Caitlyn Found, MD [facility] 03/31/2015, 11:49 AM

## 2015-04-11 ENCOUNTER — Other Ambulatory Visit: Payer: Self-pay | Admitting: Family Medicine

## 2015-04-23 ENCOUNTER — Other Ambulatory Visit: Payer: Self-pay | Admitting: Family Medicine

## 2015-05-06 ENCOUNTER — Ambulatory Visit: Payer: Medicare Other | Admitting: Family Medicine

## 2015-05-07 ENCOUNTER — Ambulatory Visit (INDEPENDENT_AMBULATORY_CARE_PROVIDER_SITE_OTHER): Payer: Medicare Other | Admitting: Pediatrics

## 2015-05-07 ENCOUNTER — Encounter: Payer: Self-pay | Admitting: Pediatrics

## 2015-05-07 VITALS — BP 142/81 | HR 77 | Temp 97.1°F | Ht 64.5 in | Wt 192.4 lb

## 2015-05-07 DIAGNOSIS — Z23 Encounter for immunization: Secondary | ICD-10-CM

## 2015-05-07 DIAGNOSIS — R030 Elevated blood-pressure reading, without diagnosis of hypertension: Secondary | ICD-10-CM

## 2015-05-07 DIAGNOSIS — IMO0001 Reserved for inherently not codable concepts without codable children: Secondary | ICD-10-CM

## 2015-05-07 DIAGNOSIS — M1A9XX0 Chronic gout, unspecified, without tophus (tophi): Secondary | ICD-10-CM | POA: Diagnosis not present

## 2015-05-07 DIAGNOSIS — R05 Cough: Secondary | ICD-10-CM

## 2015-05-07 DIAGNOSIS — J309 Allergic rhinitis, unspecified: Secondary | ICD-10-CM

## 2015-05-07 DIAGNOSIS — R059 Cough, unspecified: Secondary | ICD-10-CM

## 2015-05-07 DIAGNOSIS — E785 Hyperlipidemia, unspecified: Secondary | ICD-10-CM | POA: Diagnosis not present

## 2015-05-07 MED ORDER — PRAVASTATIN SODIUM 80 MG PO TABS: ORAL_TABLET | ORAL | Status: DC

## 2015-05-07 MED ORDER — FLUTICASONE PROPIONATE 50 MCG/ACT NA SUSP: 2.0000 | Freq: Every day | NASAL | Status: DC

## 2015-05-07 NOTE — Patient Instructions (Addendum)
Let me know: If blood pressure top number is more than 150  Or if the bottom number is regularly over 90  Simply saline rinses 3 times a day Use flonase one spray each side two times a day Keep water with you, lozenges, to try to AVOID coughing  Let me know if getting worse or not improving next week

## 2015-05-07 NOTE — Progress Notes (Signed)
Subjective:    Patient ID: Caitlyn Boone, female    DOB: 08/30/1940, 74 y.o.   MRN: [id number]  CC: Follow-up; Cough; and Nasal Congestion   HPI: Caitlyn Boone is a 74 y.o. female presenting for Follow-up; Cough; and Nasal Congestion  Has a cuff at home for BP Seen 1 mo ago for cough and congestion, treated with azithromycin Now has been coughing for at least the past week Quit smoking several years ago, was smoking 2-3 a day when she was smoking Not taking anything for cough now at home Tried cortisidin also using nasal spray Somewhat productive cough Wakes her up at night Mostly white coming out of nose No headaches or vision changes,  No lightheadedness at home    Depression screen [facility] 2/9 05/07/2015 03/31/2015 10/29/2014 05/21/2014 02/19/2014  Decreased Interest 0 0 0 0 0  Down, Depressed, Hopeless 0 0 0 0 0  PHQ - 2 Score 0 0 0 0 0     Relevant past medical, surgical, family and social history reviewed and updated as indicated. Interim medical history since our last visit reviewed. Allergies and medications reviewed and updated.    ROS: Per HPI unless specifically indicated above  Past Medical History Patient Active Problem List   Diagnosis Date Noted  . Gallstones 06/28/2014  . Hyperlipidemia 02/19/2014  . Osteoporosis 02/19/2014  . Essential hypertension 02/19/2014  . GERD (gastroesophageal reflux disease) 02/19/2014  . Gout 02/19/2014    Current Outpatient Prescriptions  Medication Sig Dispense Refill  . alendronate (FOSAMAX) 70 MG tablet Take 1 tablet (70 mg total) by mouth once a week. Take with a full glass of water on an empty stomach. 12 tablet 1  . allopurinol (ZYLOPRIM) 300 MG tablet TAKE ONE TABLET BY MOUTH ONCE DAILY 30 tablet 1  . aspirin EC 81 MG tablet Take 81 mg by mouth daily.    . calcium citrate (CALCITRATE - DOSED IN MG ELEMENTAL CALCIUM) 950 MG tablet Take 200 mg of elemental calcium by mouth daily.    . Cholecalciferol (VITAMIN D)  2000 UNITS CAPS Take 1 capsule by mouth daily.    Marland Kitchen lisinopril-hydrochlorothiazide (PRINZIDE,ZESTORETIC) 20-25 MG per tablet Take 1 tablet by mouth daily. 90 tablet 1  . Multiple Vitamin (MULTIVITAMIN WITH MINERALS) TABS tablet Take 1 tablet by mouth daily.    . pravastatin (PRAVACHOL) 80 MG tablet TAKE ONE TABLET BY MOUTH ONCE DAILY IN THE EVENING 90 tablet 3  . ranitidine (ZANTAC) 300 MG tablet TAKE ONE TABLET BY MOUTH IN THE EVENING 90 tablet 1  . fluticasone (FLONASE) 50 MCG/ACT nasal spray Place 2 sprays into both nostrils daily. 16 g 6   No current facility-administered medications for this visit.       Objective:    BP 142/81 mmHg  Pulse 77  Temp(Src) 97.1 F (36.2 C) (Oral)  Ht 5' 4.5" (1.638 m)  Wt 192 lb 6.4 oz (87.272 kg)  BMI 32.53 kg/m2  Wt Readings from Last 3 Encounters:  05/07/15 192 lb 6.4 oz (87.272 kg)  03/31/15 190 lb 3.2 oz (86.274 kg)  10/29/14 190 lb (86.183 kg)     Gen: NAD, alert, cooperative with exam, NCAT EYES: EOMI, no scleral injection or icterus ENT:  TMs pearly gray b/l, OP with some cobblestoning in posterior pharynx, no tenderness over sinuses LYMPH: no cervical LAD CV: NRRR, normal S1/S2, no murmur, distal pulses 2+ b/l Resp: CTABL, no wheezes, normal WOB Abd: +BS, soft, NTND. no guarding or organomegaly Ext:  No edema, warm Neuro: Alert and oriented, strength equal b/l UE and LE, coordination grossly normal MSK: normal muscle bulk     Assessment & Plan:   [name] was seen today for follow-up of multiple med problems. Has had congestion off and on for a month, worse when outside, will try fluticasone. If not improving will treat for sinus infection, now with white drainage from sinuses, no fevers.  Diagnoses and all orders for this visit:  Allergic rhinitis, unspecified allergic rhinitis type Symptoms worsen outside, trial fluticasone. -     fluticasone (FLONASE) 50 MCG/ACT nasal spray; Place 2 sprays into both nostrils daily.  Cough  Likely post-viral vs allergies. Start flonase, continue saline spray regularly. Lots of fluids. Let me know if not improving.  Essential HTN Continue medicines. Last labs OK for 6 months ago. RTC for recheck labs in 6 months. If has more gout flares, will need to be switched off of HCTZ for alternate agent. BP here improved on recheck.  Hyperlipidemia -     pravastatin (PRAVACHOL) 80 MG tablet; TAKE ONE TABLET BY MOUTH ONCE DAILY IN THE EVENING  Chronic gout without tophus, unspecified cause, unspecified site Continue allopurinol. No recent flares.  Flu shot give today, discussed compenents with pt.  Follow up plan: Return in about 6 months (around 11/04/2015).  Assunta Found, MD [facility] 05/07/2015, 9:45 AM  10:10

## 2015-05-08 ENCOUNTER — Ambulatory Visit: Payer: Medicare Other | Admitting: Family Medicine

## 2015-05-12 ENCOUNTER — Other Ambulatory Visit: Payer: Self-pay | Admitting: Family Medicine

## 2015-05-14 DIAGNOSIS — H35033 Hypertensive retinopathy, bilateral: Secondary | ICD-10-CM | POA: Diagnosis not present

## 2015-05-14 DIAGNOSIS — Z961 Presence of intraocular lens: Secondary | ICD-10-CM | POA: Diagnosis not present

## 2015-05-14 DIAGNOSIS — H01003 Unspecified blepharitis right eye, unspecified eyelid: Secondary | ICD-10-CM | POA: Diagnosis not present

## 2015-05-14 DIAGNOSIS — H40013 Open angle with borderline findings, low risk, bilateral: Secondary | ICD-10-CM | POA: Diagnosis not present

## 2015-05-23 ENCOUNTER — Other Ambulatory Visit: Payer: Self-pay | Admitting: Family Medicine

## 2015-06-25 ENCOUNTER — Other Ambulatory Visit: Payer: Self-pay | Admitting: Family Medicine

## 2015-07-11 ENCOUNTER — Ambulatory Visit (INDEPENDENT_AMBULATORY_CARE_PROVIDER_SITE_OTHER): Payer: Medicare Other

## 2015-07-11 ENCOUNTER — Encounter: Payer: Self-pay | Admitting: Pediatrics

## 2015-07-11 ENCOUNTER — Ambulatory Visit (INDEPENDENT_AMBULATORY_CARE_PROVIDER_SITE_OTHER): Payer: Medicare Other | Admitting: Pediatrics

## 2015-07-11 VITALS — BP 146/88 | HR 87 | Temp 98.4°F | Ht 64.5 in | Wt 190.4 lb

## 2015-07-11 DIAGNOSIS — R05 Cough: Secondary | ICD-10-CM

## 2015-07-11 DIAGNOSIS — I10 Essential (primary) hypertension: Secondary | ICD-10-CM | POA: Diagnosis not present

## 2015-07-11 DIAGNOSIS — R059 Cough, unspecified: Secondary | ICD-10-CM

## 2015-07-11 DIAGNOSIS — R062 Wheezing: Secondary | ICD-10-CM

## 2015-07-11 DIAGNOSIS — J019 Acute sinusitis, unspecified: Secondary | ICD-10-CM

## 2015-07-11 MED ORDER — LEVOFLOXACIN 750 MG PO TABS: 750.0000 mg | ORAL_TABLET | Freq: Every day | ORAL | Status: DC

## 2015-07-11 MED ORDER — SPACER/AERO CHAMBER MOUTHPIECE MISC: Status: DC

## 2015-07-11 MED ORDER — ALBUTEROL SULFATE HFA 108 (90 BASE) MCG/ACT IN AERS: 2.0000 | INHALATION_SPRAY | Freq: Four times a day (QID) | RESPIRATORY_TRACT | Status: DC | PRN

## 2015-07-11 NOTE — Progress Notes (Signed)
Subjective:    Patient ID: Caitlyn Boone, female    DOB: 03/14/1941, 75 y.o.   MRN: [id number]  CC: Cough and Nasal Congestion   HPI: Caitlyn Boone is a 75 y.o. female presenting for Cough and Nasal Congestion  Sick for past 8 days, lots of congestion, started having chills last couple days, coughing a lot, had sore throat Decreased appetite Doesn't feel SOB most of the time but does when head gets stuffy, trying to cough Has been using nasal spray and saline spray, corticidin, extra strength tylenol Remote h/o smoking, no prior wheezing problems No chest pain Decreased energy, worst past few days Productive cough  Depression screen [facility] 2/9 05/07/2015 03/31/2015 10/29/2014 05/21/2014 02/19/2014  Decreased Interest 0 0 0 0 0  Down, Depressed, Hopeless 0 0 0 0 0  PHQ - 2 Score 0 0 0 0 0     Relevant past medical, surgical, family and social history reviewed and updated as indicated. Interim medical history since our last visit reviewed. Allergies and medications reviewed and updated.    ROS: Per HPI unless specifically indicated above  History  Smoking status  . Former Smoker  . Types: Cigarettes  Smokeless tobacco  . Never Used    Comment: smoked 2-3 cig a day when smoking, quit long ago    Past Medical History Patient Active Problem List   Diagnosis Date Noted  . Gallstones 06/28/2014  . Hyperlipidemia 02/19/2014  . Osteoporosis 02/19/2014  . Essential hypertension 02/19/2014  . GERD (gastroesophageal reflux disease) 02/19/2014  . Gout 02/19/2014        Objective:    BP 146/88 mmHg  Pulse 87  Temp(Src) 98.4 F (36.9 C) (Oral)  Ht 5' 4.5" (1.638 m)  Wt 190 lb 6.4 oz (86.365 kg)  BMI 32.19 kg/m2  SpO2 97%  Wt Readings from Last 3 Encounters:  07/11/15 190 lb 6.4 oz (86.365 kg)  05/07/15 192 lb 6.4 oz (87.272 kg)  03/31/15 190 lb 3.2 oz (86.274 kg)     Gen: NAD, alert, cooperative with exam, NCAT EYES: EOMI, no scleral injection or icterus ENT:   R TM red with effusion, L TM red, splayed LR, OP without erythema LYMPH: no cervical LAD CV: NRRR, normal S1/S2, no murmur, distal pulses 2+ b/l Resp: moving air well, rhonchi RLL, few wheezes throughout with exhalation, normal WOB Abd: +BS, soft, NTND.  Ext: No edema, warm Neuro: Alert and oriented, strength equal b/l UE and LE, coordination grossly normal MSK: normal muscle bulk     Assessment & Plan:    Caitlyn Boone was seen today for cough and nasal congestion for past 8 days, getting worse. O2 sats normal. CXR with no obvious infiltrate, though does have RLL rhonchi on exam. Also wheezing. Will treat with with levofloxacin, also would cover CAP, pt with decreased PO intake, going to drink more fluids at home. Discussed albuterol usage, return precautions.  Diagnoses and all orders for this visit:  Acute sinusitis, recurrence not specified, unspecified location -     levofloxacin (LEVAQUIN) 750 MG tablet; Take 1 tablet (750 mg total) by mouth daily.  Cough -     DG Chest 2 View; Future  Wheezing -     albuterol (PROVENTIL HFA;VENTOLIN HFA) 108 (90 Base) MCG/ACT inhaler; Inhale 2 puffs into the lungs every 6 (six) hours as needed for wheezing or shortness of breath. -     Spacer/Aero Chamber Mouthpiece MISC; Please dispense one spacer for use with inhaler.  Essential hypertension Adequate control today, continue meds.   Follow up plan: Return for as scheduled for next appt. In 1 month, due for labs  Assunta Found, MD [facility] 07/11/2015, 1:50 PM

## 2015-07-16 ENCOUNTER — Other Ambulatory Visit: Payer: Self-pay

## 2015-07-16 DIAGNOSIS — Z1231 Encounter for screening mammogram for malignant neoplasm of breast: Secondary | ICD-10-CM

## 2015-08-06 ENCOUNTER — Encounter: Payer: Self-pay | Admitting: Pediatrics

## 2015-08-06 ENCOUNTER — Ambulatory Visit (INDEPENDENT_AMBULATORY_CARE_PROVIDER_SITE_OTHER): Payer: Medicare Other | Admitting: Pediatrics

## 2015-08-06 VITALS — BP 136/74 | HR 63 | Temp 98.2°F | Ht 64.5 in | Wt 190.8 lb

## 2015-08-06 DIAGNOSIS — E785 Hyperlipidemia, unspecified: Secondary | ICD-10-CM

## 2015-08-06 DIAGNOSIS — M1A9XX0 Chronic gout, unspecified, without tophus (tophi): Secondary | ICD-10-CM

## 2015-08-06 DIAGNOSIS — I10 Essential (primary) hypertension: Secondary | ICD-10-CM | POA: Diagnosis not present

## 2015-08-06 MED ORDER — PRAVASTATIN SODIUM 80 MG PO TABS: ORAL_TABLET | ORAL | Status: DC

## 2015-08-06 MED ORDER — LISINOPRIL-HYDROCHLOROTHIAZIDE 20-25 MG PO TABS
1.0000 | ORAL_TABLET | Freq: Every day | ORAL | Status: DC
Start: 2015-08-06 — End: 2016-02-16

## 2015-08-06 NOTE — Progress Notes (Signed)
    Subjective:    Patient ID: Caitlyn Boone, female    DOB: [birth year], 75 y.o.   MRN: [id number]  CC: Follow-up multiple med problems  HPI: Caitlyn Boone is a 75 y.o. female presenting for Follow-up  HTN: No HA, vision changes. Had one day of elevated BP readings.  Breathing: still coughing some, no fevers Appetite is fine Otherwise feeling normal No chest pain with exertion Able to walk up a flight of stairs with no SOB or chest pain  No history of MI or CVA Was told by insurance company that she needed some kind of heart scan but is asymptomatic, already on statin, ASA for primary prevention, BP is well controlled and with HR in low 60s I wouldn't start a betablocker   Depression screen [facility] 2/9 08/06/2015 05/07/2015 03/31/2015 10/29/2014 05/21/2014  Decreased Interest 0 0 0 0 0  Down, Depressed, Hopeless 0 0 0 0 0  PHQ - 2 Score 0 0 0 0 0     Relevant past medical, surgical, family and social history reviewed and updated as indicated. Interim medical history since our last visit reviewed. Allergies and medications reviewed and updated.    ROS: Per HPI unless specifically indicated above  History  Smoking status  . Former Smoker  . Types: Cigarettes  Smokeless tobacco  . Never Used    Comment: smoked 2-3 cig a day when smoking, quit long ago    Past Medical History Patient Active Problem List   Diagnosis Date Noted  . Gallstones 06/28/2014  . Hyperlipidemia 02/19/2014  . Osteoporosis 02/19/2014  . Essential hypertension 02/19/2014  . GERD (gastroesophageal reflux disease) 02/19/2014  . Gout 02/19/2014        Objective:    BP 136/74 mmHg  Pulse 63  Temp(Src) 98.2 F (36.8 C) (Oral)  Ht 5' 4.5" (1.638 m)  Wt 190 lb 12.8 oz (86.546 kg)  BMI 32.26 kg/m2  Wt Readings from Last 3 Encounters:  08/06/15 190 lb 12.8 oz (86.546 kg)  07/11/15 190 lb 6.4 oz (86.365 kg)  05/07/15 192 lb 6.4 oz (87.272 kg)     Gen: NAD, alert, cooperative with exam,  NCAT EYES: EOMI, no scleral injection or icterus ENT:  TMs pearly gray b/l, OP without erythema LYMPH: no cervical LAD CV: NRRR, normal S1/S2, distal pulses 2+ b/l Resp: CTABL, no wheezes, normal WOB Abd: +BS, soft, NTND. no guarding or organomegaly Ext: No edema, warm Neuro: Alert and oriented, strength equal b/l UE and LE, coordination grossly normal MSK: normal muscle bulk     Assessment & Plan:    Caitlyn Boone was seen today for follow-up multiple med problems  Diagnoses and all orders for this visit:  Hyperlipidemia Continue pravastatin, primary prevention -     pravastatin (PRAVACHOL) 80 MG tablet; TAKE ONE TABLET BY MOUTH ONCE DAILY IN THE EVENING  Essential hypertension Adequate control, check labs, cont meds -     lisinopril-hydrochlorothiazide (PRINZIDE,ZESTORETIC) 20-25 MG tablet; Take 1 tablet by mouth daily. -     CMP14+EGFR  Chronic gout without tophus, unspecified cause, unspecified site No recent flares, cont allopurinol. Checking Cr today.   Follow up plan: Return in about 6 months (around 02/06/2016).  Caitlyn Found, MD [facility] 08/06/2015, 8:38 AM

## 2015-08-06 NOTE — Patient Instructions (Signed)
Let me know if Blood pressure at home is >140 or >90 regularly

## 2015-08-07 LAB — CMP14+EGFR
ALK PHOS: 52 IU/L (ref 39–117)
ALT: 22 IU/L (ref 0–32)
AST: 24 IU/L (ref 0–40)
Albumin/Globulin Ratio: 1.5 (ref 1.1–2.5)
Albumin: 4.3 g/dL (ref 3.5–4.8)
BUN/Creatinine Ratio: 18 (ref 11–26)
BUN: 13 mg/dL (ref 8–27)
Bilirubin Total: 0.3 mg/dL (ref 0.0–1.2)
CO2: 26 mmol/L (ref 18–29)
CREATININE: 0.73 mg/dL (ref 0.57–1.00)
Calcium: 10.2 mg/dL (ref 8.7–10.3)
Chloride: 99 mmol/L (ref 96–106)
GFR calc Af Amer: 93 mL/min/{1.73_m2} (ref >59)
GFR calc non Af Amer: 81 mL/min/{1.73_m2} (ref >59)
GLUCOSE: 95 mg/dL (ref 65–99)
Globulin, Total: 2.8 g/dL (ref 1.5–4.5)
Potassium: 3.8 mmol/L (ref 3.5–5.2)
Sodium: 143 mmol/L (ref 134–144)
Total Protein: 7.1 g/dL (ref 6.0–8.5)

## 2015-08-13 ENCOUNTER — Other Ambulatory Visit: Payer: Self-pay | Admitting: Pediatrics

## 2015-08-14 ENCOUNTER — Ambulatory Visit
Admission: RE | Admit: 2015-08-14 | Discharge: 2015-08-14 | Disposition: A | Discharge status: Home / Self Care | Payer: Medicare Other | Source: Ambulatory Visit

## 2015-08-14 DIAGNOSIS — Z1231 Encounter for screening mammogram for malignant neoplasm of breast: Secondary | ICD-10-CM | POA: Diagnosis not present

## 2015-09-22 ENCOUNTER — Other Ambulatory Visit: Payer: Self-pay | Admitting: Pediatrics

## 2015-10-21 ENCOUNTER — Other Ambulatory Visit: Payer: Self-pay | Admitting: Pediatrics

## 2015-11-11 DIAGNOSIS — M25511 Pain in right shoulder: Secondary | ICD-10-CM | POA: Diagnosis not present

## 2015-11-12 DIAGNOSIS — Z961 Presence of intraocular lens: Secondary | ICD-10-CM | POA: Diagnosis not present

## 2015-11-12 DIAGNOSIS — H40013 Open angle with borderline findings, low risk, bilateral: Secondary | ICD-10-CM | POA: Diagnosis not present

## 2015-11-12 DIAGNOSIS — H35033 Hypertensive retinopathy, bilateral: Secondary | ICD-10-CM | POA: Diagnosis not present

## 2015-11-12 DIAGNOSIS — H01003 Unspecified blepharitis right eye, unspecified eyelid: Secondary | ICD-10-CM | POA: Diagnosis not present

## 2015-12-11 DIAGNOSIS — M25511 Pain in right shoulder: Secondary | ICD-10-CM | POA: Diagnosis not present

## 2015-12-22 ENCOUNTER — Telehealth: Payer: Self-pay | Admitting: Pharmacist

## 2015-12-22 DIAGNOSIS — Z1211 Encounter for screening for malignant neoplasm of colon: Secondary | ICD-10-CM

## 2015-12-24 NOTE — Telephone Encounter (Signed)
Yes, last colonoscopy in 2010 recommended repeat in 5 yrs, I put in the referral, can you let her know?

## 2015-12-25 NOTE — Telephone Encounter (Signed)
Patient notified

## 2015-12-26 ENCOUNTER — Encounter: Payer: Self-pay | Admitting: Gastroenterology

## 2016-01-23 ENCOUNTER — Other Ambulatory Visit: Payer: Self-pay | Admitting: Pediatrics

## 2016-02-06 ENCOUNTER — Other Ambulatory Visit: Payer: Self-pay | Admitting: Pediatrics

## 2016-02-10 ENCOUNTER — Telehealth: Payer: Self-pay | Admitting: *Deleted

## 2016-02-10 NOTE — Telephone Encounter (Signed)
Left message for patient to call back to reschedule appt for 09/06 due to Dr. Evette Doffing being out

## 2016-02-11 ENCOUNTER — Ambulatory Visit: Payer: Medicare Other | Admitting: Pediatrics

## 2016-02-12 DIAGNOSIS — H40013 Open angle with borderline findings, low risk, bilateral: Secondary | ICD-10-CM | POA: Diagnosis not present

## 2016-02-16 ENCOUNTER — Ambulatory Visit (INDEPENDENT_AMBULATORY_CARE_PROVIDER_SITE_OTHER): Payer: Medicare Other | Admitting: Pediatrics

## 2016-02-16 ENCOUNTER — Encounter: Payer: Self-pay | Admitting: Pediatrics

## 2016-02-16 VITALS — BP 121/79 | HR 55 | Temp 96.9°F | Ht 64.5 in | Wt 194.6 lb

## 2016-02-16 DIAGNOSIS — E785 Hyperlipidemia, unspecified: Secondary | ICD-10-CM

## 2016-02-16 DIAGNOSIS — I10 Essential (primary) hypertension: Secondary | ICD-10-CM

## 2016-02-16 DIAGNOSIS — M810 Age-related osteoporosis without current pathological fracture: Secondary | ICD-10-CM | POA: Diagnosis not present

## 2016-02-16 DIAGNOSIS — E669 Obesity, unspecified: Secondary | ICD-10-CM | POA: Diagnosis not present

## 2016-02-16 DIAGNOSIS — K219 Gastro-esophageal reflux disease without esophagitis: Secondary | ICD-10-CM | POA: Diagnosis not present

## 2016-02-16 DIAGNOSIS — M1A9XX0 Chronic gout, unspecified, without tophus (tophi): Secondary | ICD-10-CM

## 2016-02-16 DIAGNOSIS — E66811 Obesity, class 1: Secondary | ICD-10-CM

## 2016-02-16 MED ORDER — LISINOPRIL-HYDROCHLOROTHIAZIDE 20-25 MG PO TABS: 1.0000 | ORAL_TABLET | Freq: Every day | ORAL | 1 refills | Status: DC

## 2016-02-16 MED ORDER — PRAVASTATIN SODIUM 80 MG PO TABS: ORAL_TABLET | ORAL | 3 refills | Status: DC

## 2016-02-16 MED ORDER — ALLOPURINOL 300 MG PO TABS: 300.0000 mg | ORAL_TABLET | Freq: Every day | ORAL | 1 refills | Status: DC

## 2016-02-16 NOTE — Progress Notes (Signed)
  Subjective:   Patient ID: NYLANI ROCHELL, female    DOB: [birth year], 75 y.o.   MRN: [id number] CC: Follow-up (6 month)  HPI: Caitlyn Boone is a 75 y.o. female presenting for Follow-up (6 month)  HTN: no CP, SOB, HA  Ears itching all the time Sometimes uses qtips in the outside Sometimes feels like there is water  GER: zantac helps Occasionally has symptoms when she lies down for afternoon nap, takes tums with improvement  Gout: no recent flares, takes allopurinol daily  Elevated BMI: trying to eat fruit/veg Not regularly walking  HLD: on statin daily  Relevant past medical, surgical, family and social history reviewed. Allergies and medications reviewed and updated. History  Smoking Status  . Former Smoker  . Types: Cigarettes  Smokeless Tobacco  . Never Used    Comment: smoked 2-3 cig a day when smoking, quit long ago   ROS: Per HPI   Objective:    BP 121/79   Pulse (!) 55   Temp (!) 96.9 F (36.1 C) (Oral)   Ht 5' 4.5" (1.638 m)   Wt 194 lb 9.6 oz (88.3 kg)   BMI 32.89 kg/m   Wt Readings from Last 3 Encounters:  02/16/16 194 lb 9.6 oz (88.3 kg)  08/06/15 190 lb 12.8 oz (86.5 kg)  07/11/15 190 lb 6.4 oz (86.4 kg)    Gen: NAD, alert, cooperative with exam, NCAT EYES: EOMI, no conjunctival injection, or no icterus ENT: OP without erythema LYMPH: no cervical LAD CV: NRRR, normal S1/S2, no murmur, distal pulses 2+ b/l Resp: CTABL, no wheezes, normal WOB Abd: +BS, soft, NTND. no guarding or organomegaly Ext: No edema, warm Neuro: Alert and oriented   Assessment & Plan:  [place] was seen today for follow-up multiple med problems  Diagnoses and all orders for this visit:  Essential hypertension Well controlled today, cont current meds Labs last 08/2015 -     lisinopril-hydrochlorothiazide (PRINZIDE,ZESTORETIC) 20-25 MG tablet; Take 1 tablet by mouth daily.  Chronic gout without tophus, unspecified cause, unspecified site No recent flares Cont  allopuriol -     allopurinol (ZYLOPRIM) 300 MG tablet; Take 1 tablet (300 mg total) by mouth daily.  Gastroesophageal reflux disease, esophagitis presence not specified Cont zantac If worsening symptoms, let me know  Osteoporosis On alendronate  Hyperlipidemia Cont statin -     pravastatin (PRAVACHOL) 80 MG tablet; TAKE ONE TABLET BY MOUTH ONCE DAILY IN THE EVENING  Obesity (BMI 30.0-34.9) Cont lifestyle changes, increase activity, increase fruit/veg  Follow up plan: 6 mo Assunta Found, MD [facility]

## 2016-02-24 ENCOUNTER — Ambulatory Visit (AMBULATORY_SURGERY_CENTER): Payer: Medicare Other

## 2016-02-24 ENCOUNTER — Encounter (INDEPENDENT_AMBULATORY_CARE_PROVIDER_SITE_OTHER): Payer: Self-pay

## 2016-02-24 VITALS — Ht 64.5 in | Wt 193.4 lb

## 2016-02-24 DIAGNOSIS — Z800 Family history of malignant neoplasm of digestive organs: Secondary | ICD-10-CM

## 2016-02-24 DIAGNOSIS — Z8371 Family history of colonic polyps: Secondary | ICD-10-CM

## 2016-02-24 DIAGNOSIS — Z86010 Personal history of colonic polyps: Secondary | ICD-10-CM

## 2016-02-24 MED ORDER — NA SULFATE-K SULFATE-MG SULF 17.5-3.13-1.6 GM/177ML PO SOLN: ORAL | 0 refills | Status: DC

## 2016-02-24 NOTE — Progress Notes (Signed)
Per pt, no allergies to soy or egg products.Pt not taking any weight loss meds or using  O2 at home. 

## 2016-02-25 ENCOUNTER — Encounter: Payer: Self-pay | Admitting: Gastroenterology

## 2016-03-09 ENCOUNTER — Encounter: Payer: Self-pay | Admitting: Gastroenterology

## 2016-03-09 ENCOUNTER — Ambulatory Visit (AMBULATORY_SURGERY_CENTER): Payer: Medicare Other | Admitting: Gastroenterology

## 2016-03-09 VITALS — BP 126/75 | HR 51 | Temp 96.8°F | Resp 11 | Ht 64.0 in | Wt 193.0 lb

## 2016-03-09 DIAGNOSIS — D120 Benign neoplasm of cecum: Secondary | ICD-10-CM | POA: Diagnosis not present

## 2016-03-09 DIAGNOSIS — I10 Essential (primary) hypertension: Secondary | ICD-10-CM | POA: Diagnosis not present

## 2016-03-09 DIAGNOSIS — Z1211 Encounter for screening for malignant neoplasm of colon: Secondary | ICD-10-CM

## 2016-03-09 DIAGNOSIS — Z800 Family history of malignant neoplasm of digestive organs: Secondary | ICD-10-CM | POA: Diagnosis not present

## 2016-03-09 DIAGNOSIS — Z1212 Encounter for screening for malignant neoplasm of rectum: Secondary | ICD-10-CM | POA: Diagnosis not present

## 2016-03-09 DIAGNOSIS — Z86010 Personal history of colonic polyps: Secondary | ICD-10-CM

## 2016-03-09 MED ORDER — SODIUM CHLORIDE 0.9 % IV SOLN: 500.0000 mL | INTRAVENOUS | Status: DC

## 2016-03-09 NOTE — Patient Instructions (Signed)
YOU HAD AN ENDOSCOPIC PROCEDURE TODAY AT THE [facility]:   Refer to the procedure report that was given to you for any specific questions about what was found during the examination.  If the procedure report does not answer your questions, please call your gastroenterologist to clarify.  If you requested that your care partner not be given the details of your procedure findings, then the procedure report has been included in a sealed envelope for you to review at your convenience later.  YOU SHOULD EXPECT: Some feelings of bloating in the abdomen. Passage of more gas than usual.  Walking can help get rid of the air that was put into your GI tract during the procedure and reduce the bloating. If you had a lower endoscopy (such as a colonoscopy or flexible sigmoidoscopy) you may notice spotting of blood in your stool or on the toilet paper. If you underwent a bowel prep for your procedure, you may not have a normal bowel movement for a few days.  Please Note:  You might notice some irritation and congestion in your nose or some drainage.  This is from the oxygen used during your procedure.  There is no need for concern and it should clear up in a day or so.  SYMPTOMS TO REPORT IMMEDIATELY:   Following lower endoscopy (colonoscopy or flexible sigmoidoscopy):  Excessive amounts of blood in the stool  Significant tenderness or worsening of abdominal pains  Swelling of the abdomen that is new, acute  Fever of 100F or higher    For urgent or emergent issues, a gastroenterologist can be reached at any hour by calling [phone].   DIET:  We do recommend a small meal at first, but then you may proceed to your regular diet.  Drink plenty of fluids but you should avoid alcoholic beverages for 24 hours.  ACTIVITY:  You should plan to take it easy for the rest of today and you should NOT DRIVE or use heavy machinery until tomorrow (because of the sedation medicines used during the test).     FOLLOW UP: Our staff will call the number listed on your records the next business day following your procedure to check on you and address any questions or concerns that you may have regarding the information given to you following your procedure. If we do not reach you, we will leave a message.  However, if you are feeling well and you are not experiencing any problems, there is no need to return our call.  We will assume that you have returned to your regular daily activities without incident.  If any biopsies were taken you will be contacted by phone or by letter within the next 1-3 weeks.  Please call us at [phone] if you have not heard about the biopsies in 3 weeks.    SIGNATURES/CONFIDENTIALITY: You and/or your care partner have signed paperwork which will be entered into your electronic medical record.  These signatures attest to the fact that that the information above on your After Visit Summary has been reviewed and is understood.  Full responsibility of the confidentiality of this discharge information lies with you and/or your care-partner.   Resume medications. Information given on polyps. 

## 2016-03-09 NOTE — Op Note (Signed)
[place] Patient Name: Caitlyn Boone Procedure Date: 03/09/2016 8:26 AM MRN: [id number] Endoscopist: Milus Banister , MD Age: 75 Referring MD:  Date of Birth: April 08, 1941 Gender: Female Account #: [id number] Procedure:                Colonoscopy Indications:              Screening in patient at increased risk: Family                            history of 1st-degree relative with colorectal                            cancer (brother) Medicines:                Monitored Anesthesia Care Procedure:                Pre-Anesthesia Assessment:                           - Prior to the procedure, a History and Physical                            was performed, and patient medications and                            allergies were reviewed. The patient's tolerance of                            previous anesthesia was also reviewed. The risks                            and benefits of the procedure and the sedation                            options and risks were discussed with the patient.                            All questions were answered, and informed consent                            was obtained. Prior Anticoagulants: The patient has                            taken no previous anticoagulant or antiplatelet                            agents. ASA Grade Assessment: II - A patient with                            mild systemic disease. After reviewing the risks                            and benefits, the patient was deemed in  satisfactory condition to undergo the procedure.                           After obtaining informed consent, the colonoscope                            was passed under direct vision. Throughout the                            procedure, the patient's blood pressure, pulse, and                            oxygen saturations were monitored continuously. The                            Model CF-HQ190L [id number]) scope was introduced                             through the anus and advanced to the the cecum,                            identified by appendiceal orifice and ileocecal                            valve. The colonoscopy was performed without                            difficulty. The patient tolerated the procedure                            well. The quality of the bowel preparation was                            excellent. The ileocecal valve, appendiceal                            orifice, and rectum were photographed. Scope In: 8:36:13 AM Scope Out: 8:47:37 AM Scope Withdrawal Time: 0 hours 9 minutes 51 seconds  Total Procedure Duration: 0 hours 11 minutes 24 seconds  Findings:                 A 7 mm polyp was found in the cecum. The polyp was                            sessile. The polyp was removed with a hot snare.                            Resection and retrieval were complete.                           The exam was otherwise without abnormality on                            direct and retroflexion views. Complications:  No immediate complications. Estimated blood loss:                            None. Estimated Blood Loss:     Estimated blood loss: none. Impression:               - One 7 mm polyp in the cecum, removed with a hot                            snare. Resected and retrieved.                           - The examination was otherwise normal on direct                            and retroflexion views. Recommendation:           - Patient has a contact number available for                            emergencies. The signs and symptoms of potential                            delayed complications were discussed with the                            patient. Return to normal activities tomorrow.                            Written discharge instructions were provided to the                            patient.                           - Resume previous diet.                            - Continue present medications.                           - Await final pathology results from this polyp.                            You may not need any further colon cancer screening                            tests (including stool testing). These types of                            tests generally stop around age 38-80. Milus Banister, MD 03/09/2016 8:52:10 AM This report has been signed electronically.

## 2016-03-09 NOTE — Progress Notes (Signed)
Called to room to assist during endoscopic procedure.  Patient ID and intended procedure confirmed with present staff. Received instructions for my participation in the procedure from the performing physician.  

## 2016-03-10 ENCOUNTER — Telehealth: Payer: Self-pay

## 2016-03-10 NOTE — Telephone Encounter (Signed)
  Follow up Call-  Call back number 03/09/2016  Post procedure Call Back phone  # [phone]  Permission to leave phone message Yes  Some recent data might be hidden     Patient questions:  Do you have a fever, pain , or abdominal swelling? No. Pain Score  0 *  Have you tolerated food without any problems? Yes.    Have you been able to return to your normal activities? Yes.    Do you have any questions about your discharge instructions: Diet   No. Medications  No. Follow up visit  No.  Do you have questions or concerns about your Care? No.  Actions: * If pain score is 4 or above: No action needed, pain <4.

## 2016-03-15 ENCOUNTER — Encounter: Payer: Self-pay | Admitting: Gastroenterology

## 2016-03-17 ENCOUNTER — Other Ambulatory Visit: Payer: Self-pay | Admitting: Nurse Practitioner

## 2016-03-30 ENCOUNTER — Ambulatory Visit (INDEPENDENT_AMBULATORY_CARE_PROVIDER_SITE_OTHER): Payer: Medicare Other

## 2016-03-30 DIAGNOSIS — Z23 Encounter for immunization: Secondary | ICD-10-CM

## 2016-04-16 ENCOUNTER — Other Ambulatory Visit: Payer: Self-pay | Admitting: Pediatrics

## 2016-04-22 ENCOUNTER — Encounter: Payer: Self-pay | Admitting: Family Medicine

## 2016-04-22 ENCOUNTER — Ambulatory Visit (INDEPENDENT_AMBULATORY_CARE_PROVIDER_SITE_OTHER): Payer: Medicare Other | Admitting: Family Medicine

## 2016-04-22 VITALS — BP 153/73 | HR 68 | Temp 96.8°F | Ht 64.0 in | Wt 193.2 lb

## 2016-04-22 DIAGNOSIS — J029 Acute pharyngitis, unspecified: Secondary | ICD-10-CM

## 2016-04-22 DIAGNOSIS — R350 Frequency of micturition: Secondary | ICD-10-CM

## 2016-04-22 DIAGNOSIS — R109 Unspecified abdominal pain: Secondary | ICD-10-CM | POA: Diagnosis not present

## 2016-04-22 LAB — MICROSCOPIC EXAMINATION: Epithelial Cells (non renal): >10 /hpf — AB (ref 0–10)

## 2016-04-22 LAB — URINALYSIS, COMPLETE
BILIRUBIN UA: NEGATIVE
Glucose, UA: NEGATIVE
Ketones, UA: NEGATIVE
Nitrite, UA: NEGATIVE
PH UA: 6.5 (ref 5.0–7.5)
PROTEIN UA: NEGATIVE
Specific Gravity, UA: 1.015 (ref 1.005–1.030)
Urobilinogen, Ur: 0.2 mg/dL (ref 0.2–1.0)

## 2016-04-22 MED ORDER — CEPHALEXIN 500 MG PO CAPS: 500.0000 mg | ORAL_CAPSULE | Freq: Three times a day (TID) | ORAL | 0 refills | Status: DC

## 2016-04-22 NOTE — Progress Notes (Signed)
   HPI  Patient presents today here with left flank pain and urinary frequency.  Patient states that over the last week or so she's had left lower back and left flank pain described as sharp and dull, worse with certain movements. She's also had increased urinary frequency.  She also endorses chills. She denies any dysuria, suprapubic tenderness, or overt fevers.  She also describes sore throat described as "scratchy", it responded well to Chloraseptic Spray and Flonase. However it has started to come back.  PMH: Smoking status noted ROS: Per HPI  Objective: BP (!) 153/73   Pulse 68   Temp (!) 96.8 F (36 C) (Oral)   Ht 5\' 4"  (1.626 m)   Wt 193 lb 3.2 oz (87.6 kg)   BMI 33.16 kg/m  Gen: NAD, alert, cooperative with exam HEENT: NCAT, slightly hoarse voice CV: RRR, good S1/S2, no murmur Resp: CTABL, no wheezes, non-labored Abd: SNTND, BS present, no guarding or organomegaly and no CVA tenderness Ext: No edema, warm Neuro: Alert and oriented, No gross deficits  MSK Tenderness to palpation of left sided flank and left abdominal wall, left low back in the upper lumbar/lower thoracic area. No midline tenderness  Assessment and plan:  # Urinary tract infection Treating with Keflex, on the remote chance that she has a strep infection this would also cover that. Urine culture pending No signs of urosepsis, pyelonephritis is very unlikely  # Left flank pain Most likely musculoskeletal, she was playing with her great-granddaughter last week following her on her shoulders and bouncing around, this is likely the time of injury. Heat, Tylenol, massage Discussed usual course of illness  # Sore throat Intermittent, likely postnasal drip Keflex for UTI will also help if infectious Continue Flonase and Chloraseptic Spray   Orders Placed This Encounter  Procedures  . Urine culture  . Urinalysis, Complete    Meds ordered this encounter  Medications  . cephALEXin (KEFLEX) 500  MG capsule    Sig: Take 1 capsule (500 mg total) by mouth 3 (three) times daily.    Dispense:  30 capsule    Refill:  0    Laroy Apple, MD [place] 04/22/2016, 11:21 AM

## 2016-04-22 NOTE — Patient Instructions (Signed)
Great to meet you!  I am sending a urine culture to be sure that keflex is the right antibiotic  For your back, try tylenol, heat, and massage. This should start to improve in the next few days.    Urinary Tract Infection, Adult A urinary tract infection (UTI) is an infection of any part of the urinary tract, which includes the kidneys, ureters, bladder, and urethra. These organs make, store, and get rid of urine in the body. UTI can be a bladder infection (cystitis) or kidney infection (pyelonephritis). What are the causes? This infection may be caused by fungi, viruses, or bacteria. Bacteria are the most common cause of UTIs. This condition can also be caused by repeated incomplete emptying of the bladder during urination. What increases the risk? This condition is more likely to develop if:  You ignore your need to urinate or hold urine for long periods of time.  You do not empty your bladder completely during urination.  You wipe back to front after urinating or having a bowel movement, if you are female.  You are uncircumcised, if you are female.  You are constipated.  You have a urinary catheter that stays in place (indwelling).  You have a weak defense (immune) system.  You have a medical condition that affects your bowels, kidneys, or bladder.  You have diabetes.  You take antibiotic medicines frequently or for long periods of time, and the antibiotics no longer work well against certain types of infections (antibiotic resistance).  You take medicines that irritate your urinary tract.  You are exposed to chemicals that irritate your urinary tract.  You are female. What are the signs or symptoms? Symptoms of this condition include:  Fever.  Frequent urination or passing small amounts of urine frequently.  Needing to urinate urgently.  Pain or burning with urination.  Urine that smells bad or unusual.  Cloudy urine.  Pain in the lower abdomen or  back.  Trouble urinating.  Blood in the urine.  Vomiting or being less hungry than normal.  Diarrhea or abdominal pain.  Vaginal discharge, if you are female. How is this diagnosed? This condition is diagnosed with a medical history and physical exam. You will also need to provide a urine sample to test your urine. Other tests may be done, including:  Blood tests.  Sexually transmitted disease (STD) testing. If you have had more than one UTI, a cystoscopy or imaging studies may be done to determine the cause of the infections. How is this treated? Treatment for this condition often includes a combination of two or more of the following:  Antibiotic medicine.  Other medicines to treat less common causes of UTI.  Over-the-counter medicines to treat pain.  Drinking enough water to stay hydrated. Follow these instructions at home:  Take over-the-counter and prescription medicines only as told by your health care provider.  If you were prescribed an antibiotic, take it as told by your health care provider. Do not stop taking the antibiotic even if you start to feel better.  Avoid alcohol, caffeine, tea, and carbonated beverages. They can irritate your bladder.  Drink enough fluid to keep your urine clear or pale yellow.  Keep all follow-up visits as told by your health care provider. This is important.  Make sure to:  Empty your bladder often and completely. Do not hold urine for long periods of time.  Empty your bladder before and after sex.  Wipe from front to back after a bowel movement if  you are female. Use each tissue one time when you wipe. Contact a health care provider if:  You have back pain.  You have a fever.  You feel nauseous or vomit.  Your symptoms do not get better after 3 days.  Your symptoms go away and then return. Get help right away if:  You have severe back pain or lower abdominal pain.  You are vomiting and cannot keep down any  medicines or water. This information is not intended to replace advice given to you by your health care provider. Make sure you discuss any questions you have with your health care provider. Document Released: 03/03/2005 Document Revised: 11/05/2015 Document Reviewed: 04/14/2015 Elsevier Interactive Patient Education  2017 [employer].

## 2016-04-25 LAB — URINE CULTURE

## 2016-06-17 DIAGNOSIS — Z961 Presence of intraocular lens: Secondary | ICD-10-CM | POA: Diagnosis not present

## 2016-06-17 DIAGNOSIS — H43813 Vitreous degeneration, bilateral: Secondary | ICD-10-CM | POA: Diagnosis not present

## 2016-06-17 DIAGNOSIS — H35033 Hypertensive retinopathy, bilateral: Secondary | ICD-10-CM | POA: Diagnosis not present

## 2016-06-17 DIAGNOSIS — H40013 Open angle with borderline findings, low risk, bilateral: Secondary | ICD-10-CM | POA: Diagnosis not present

## 2016-06-28 ENCOUNTER — Ambulatory Visit (INDEPENDENT_AMBULATORY_CARE_PROVIDER_SITE_OTHER): Payer: Medicare Other | Admitting: Pediatrics

## 2016-06-28 ENCOUNTER — Encounter: Payer: Self-pay | Admitting: Pediatrics

## 2016-06-28 VITALS — BP 140/92 | HR 89 | Temp 100.3°F | Ht 64.0 in | Wt 189.2 lb

## 2016-06-28 DIAGNOSIS — J069 Acute upper respiratory infection, unspecified: Secondary | ICD-10-CM | POA: Diagnosis not present

## 2016-06-28 DIAGNOSIS — H65113 Acute and subacute allergic otitis media (mucoid) (sanguinous) (serous), bilateral: Secondary | ICD-10-CM | POA: Diagnosis not present

## 2016-06-28 MED ORDER — AMOXICILLIN 875 MG PO TABS: 875.0000 mg | ORAL_TABLET | Freq: Two times a day (BID) | ORAL | 0 refills | Status: DC

## 2016-06-28 NOTE — Progress Notes (Signed)
  Subjective:   Patient ID: Caitlyn Boone, female    DOB: 09-Apr-1941, 76 y.o.   MRN: [id number] CC: Ear Pain; Headache; and Cough  HPI: Caitlyn Boone is a 76 y.o. female presenting for Ear Pain; Headache; and Cough  Ongoing 5 days Took alkaseltzer plus, helped some Appetite down, drinking lots of fluids Some tylenol, tyelnol helping Ears hurting some No fever Headache and ear pain bothering her the most  Relevant past medical, surgical, family and social history reviewed. Allergies and medications reviewed and updated. History  Smoking Status  . Former Smoker  . Types: Cigarettes  . Quit date: 02/24/1979  Smokeless Tobacco  . Never Used    Comment: smoked 2-3 cig a day when smoking, quit long ago   ROS: Per HPI   Objective:    BP (!) 140/92   Pulse 89   Temp 100.3 F (37.9 C) (Oral)   Ht 5\' 4"  (1.626 m)   Wt 189 lb 3.2 oz (85.8 kg)   BMI 32.48 kg/m   Wt Readings from Last 3 Encounters:  06/28/16 189 lb 3.2 oz (85.8 kg)  04/22/16 193 lb 3.2 oz (87.6 kg)  03/09/16 193 lb (87.5 kg)    Gen: NAD, alert, cooperative with exam, NCAT EYES: EOMI, no conjunctival injection, or no icterus ENT:  TMs b/l with mucoid layer, pearly gray b/l, OP without erythema LYMPH: no cervical LAD CV: NRRR, normal S1/S2, no murmur, distal pulses 2+ b/l Resp: CTABL, no wheezes, normal WOB Abd: +BS, soft, NTND. no guarding or organomegaly Ext: No edema, warm Neuro: Alert and oriented  Assessment & Plan:  Caitlyn Boone was seen today for ear pain, headache and cough.  Diagnoses and all orders for this visit:  Acute mucoid otitis media of both ears -     amoxicillin (AMOXIL) 875 MG tablet; Take 1 tablet (875 mg total) by mouth 2 (two) times daily.  Acute URI Discussed symptomatic care  Follow up plan: prn Assunta Found, MD [place]

## 2016-07-09 ENCOUNTER — Other Ambulatory Visit: Payer: Self-pay | Admitting: Pediatrics

## 2016-07-09 DIAGNOSIS — Z1231 Encounter for screening mammogram for malignant neoplasm of breast: Secondary | ICD-10-CM

## 2016-07-20 ENCOUNTER — Other Ambulatory Visit: Payer: Self-pay | Admitting: Pediatrics

## 2016-08-16 ENCOUNTER — Ambulatory Visit
Admission: RE | Admit: 2016-08-16 | Discharge: 2016-08-16 | Disposition: A | Discharge status: Home / Self Care | Payer: Medicare Other | Source: Ambulatory Visit | Attending: Pediatrics | Admitting: Pediatrics

## 2016-08-16 DIAGNOSIS — Z1231 Encounter for screening mammogram for malignant neoplasm of breast: Secondary | ICD-10-CM

## 2016-08-27 ENCOUNTER — Ambulatory Visit (INDEPENDENT_AMBULATORY_CARE_PROVIDER_SITE_OTHER): Payer: Medicare Other | Admitting: Family Medicine

## 2016-08-27 ENCOUNTER — Encounter: Payer: Self-pay | Admitting: Family Medicine

## 2016-08-27 VITALS — BP 144/86 | HR 69 | Temp 97.8°F | Ht 64.0 in | Wt 183.0 lb

## 2016-08-27 DIAGNOSIS — J40 Bronchitis, not specified as acute or chronic: Secondary | ICD-10-CM

## 2016-08-27 MED ORDER — AZITHROMYCIN 250 MG PO TABS: ORAL_TABLET | ORAL | 0 refills | Status: DC

## 2016-08-27 MED ORDER — FLUTICASONE PROPIONATE 50 MCG/ACT NA SUSP: 2.0000 | NASAL | 5 refills | Status: DC | PRN

## 2016-08-27 NOTE — Progress Notes (Signed)
BP (!) 144/86   Pulse 69   Temp 97.8 F (36.6 C) (Oral)   Ht 5\' 4"  (1.626 m)   Wt 183 lb (83 kg)   BMI 31.41 kg/m    Subjective:    Patient ID: Caitlyn Boone, female    DOB: Oct 07, 1940, 76 y.o.   MRN: [id number]  HPI: Caitlyn Boone is a 76 y.o. female presenting on 08/27/2016 for Cough (pt here today c/o cough that she has had off and on since October)   HPI Cough and congestion Patient has been having cough and congestion that's been going on off and on since October but most recently this time it's been going on for 2 weeks. She denies any fevers or chills but does admit to sinus pressure and chest congestion and sinus congestion and postnasal drainage. She also has some pressure in the ears bilaterally. She denies any shortness of breath or wheezing. She has been using Flonase and Tylenol Sinus and cold which have helped some.  Relevant past medical, surgical, family and social history reviewed and updated as indicated. Interim medical history since our last visit reviewed. Allergies and medications reviewed and updated.  Review of Systems  Constitutional: Negative for chills and fever.  HENT: Positive for congestion, postnasal drip, rhinorrhea, sinus pressure and sore throat. Negative for ear discharge, ear pain and sneezing.   Eyes: Negative for pain, redness and visual disturbance.  Respiratory: Positive for cough. Negative for chest tightness and shortness of breath.   Cardiovascular: Negative for chest pain and leg swelling.  Genitourinary: Negative for difficulty urinating and dysuria.  Musculoskeletal: Negative for back pain and gait problem.  Skin: Negative for rash.  Neurological: Negative for light-headedness and headaches.  Psychiatric/Behavioral: Negative for agitation and behavioral problems.  All other systems reviewed and are negative.   Per HPI unless specifically indicated above     Objective:    BP (!) 144/86   Pulse 69   Temp 97.8 F (36.6 C) (Oral)    Ht 5\' 4"  (1.626 m)   Wt 183 lb (83 kg)   BMI 31.41 kg/m   Wt Readings from Last 3 Encounters:  08/27/16 183 lb (83 kg)  06/28/16 189 lb 3.2 oz (85.8 kg)  04/22/16 193 lb 3.2 oz (87.6 kg)    Physical Exam  Constitutional: She is oriented to person, place, and time. She appears well-developed and well-nourished. No distress.  HENT:  Right Ear: Tympanic membrane, external ear and ear canal normal.  Left Ear: Tympanic membrane, external ear and ear canal normal.  Nose: Mucosal edema and rhinorrhea present. No epistaxis. Right sinus exhibits no maxillary sinus tenderness and no frontal sinus tenderness. Left sinus exhibits no maxillary sinus tenderness and no frontal sinus tenderness.  Mouth/Throat: Uvula is midline and mucous membranes are normal. Posterior oropharyngeal edema and posterior oropharyngeal erythema present. No oropharyngeal exudate or tonsillar abscesses.  Eyes: Conjunctivae are normal.  Neck: Neck supple.  Cardiovascular: Normal rate, regular rhythm, normal heart sounds and intact distal pulses.   No murmur heard. Pulmonary/Chest: Effort normal and breath sounds normal. No respiratory distress. She has no decreased breath sounds. She has no wheezes. She has no rhonchi. She has no rales.  Musculoskeletal: Normal range of motion. She exhibits no edema or tenderness.  Lymphadenopathy:    She has no cervical adenopathy.  Neurological: She is alert and oriented to person, place, and time. Coordination normal.  Skin: Skin is warm and dry. No rash noted. She is not  diaphoretic.  Psychiatric: She has a normal mood and affect. Her behavior is normal.  Vitals reviewed.     Assessment & Plan:   Problem List Items Addressed This Visit    None    Visit Diagnoses    Bronchitis    -  Primary   Relevant Medications   azithromycin (ZITHROMAX) 250 MG tablet       Follow up plan: Return if symptoms worsen or fail to improve.  Counseling provided for all of the vaccine  components No orders of the defined types were placed in this encounter.   Caryl Pina, MD [facility] 08/27/2016, 12:41 PM

## 2016-09-06 ENCOUNTER — Other Ambulatory Visit: Payer: Self-pay | Admitting: Pediatrics

## 2016-09-06 DIAGNOSIS — M1A9XX0 Chronic gout, unspecified, without tophus (tophi): Secondary | ICD-10-CM

## 2016-09-08 ENCOUNTER — Other Ambulatory Visit: Payer: Self-pay | Admitting: Pediatrics

## 2016-09-08 DIAGNOSIS — M1A9XX0 Chronic gout, unspecified, without tophus (tophi): Secondary | ICD-10-CM

## 2016-10-16 ENCOUNTER — Other Ambulatory Visit: Payer: Self-pay | Admitting: Pediatrics

## 2016-10-26 ENCOUNTER — Other Ambulatory Visit: Payer: Self-pay | Admitting: Pediatrics

## 2016-10-26 DIAGNOSIS — I10 Essential (primary) hypertension: Secondary | ICD-10-CM

## 2016-10-28 ENCOUNTER — Other Ambulatory Visit: Payer: Self-pay | Admitting: Pediatrics

## 2016-10-28 DIAGNOSIS — I10 Essential (primary) hypertension: Secondary | ICD-10-CM

## 2016-11-22 ENCOUNTER — Other Ambulatory Visit: Payer: Self-pay | Admitting: Pediatrics

## 2016-11-23 ENCOUNTER — Other Ambulatory Visit: Payer: Self-pay | Admitting: Pediatrics

## 2016-11-25 ENCOUNTER — Other Ambulatory Visit: Payer: Self-pay | Admitting: Pediatrics

## 2016-11-29 ENCOUNTER — Other Ambulatory Visit: Payer: Self-pay

## 2016-11-29 MED ORDER — ALENDRONATE SODIUM 70 MG PO TABS: ORAL_TABLET | ORAL | 0 refills | Status: DC

## 2016-12-10 ENCOUNTER — Ambulatory Visit (INDEPENDENT_AMBULATORY_CARE_PROVIDER_SITE_OTHER): Payer: Medicare Other | Admitting: *Deleted

## 2016-12-10 ENCOUNTER — Ambulatory Visit (INDEPENDENT_AMBULATORY_CARE_PROVIDER_SITE_OTHER): Payer: Medicare Other

## 2016-12-10 VITALS — BP 134/79 | HR 55 | Ht 63.0 in | Wt 188.0 lb

## 2016-12-10 DIAGNOSIS — Z0000 Encounter for general adult medical examination without abnormal findings: Secondary | ICD-10-CM | POA: Diagnosis not present

## 2016-12-10 DIAGNOSIS — M810 Age-related osteoporosis without current pathological fracture: Secondary | ICD-10-CM

## 2016-12-10 NOTE — Patient Instructions (Signed)
  Ms. Greenhouse , Thank you for taking time to come for your Medicare Wellness Visit. I appreciate your ongoing commitment to your health goals. Please review the following plan we discussed and let me know if I can assist you in the future.   These are the goals we discussed: Goals    . Exercise 150 minutes per week (moderate activity)          [employer] program at the [employer] a copy of your Living Will and Press photographer for your chart.   This is a list of the screening recommended for you and due dates:  Health Maintenance  Topic Date Due  . Tetanus Vaccine  11/12/1958  . DEXA scan (bone density measurement)  10/28/2016  . Flu Shot  01/05/2017  . Pneumonia vaccines  Completed

## 2016-12-10 NOTE — Progress Notes (Signed)
Subjective:   Caitlyn Boone is a 76 y.o. female who presents for an Initial Medicare Annual Wellness Visit. Caitlyn Boone is retired from the [employer]. She enjoys working puzzles and attending church and bible study. She lives at home with her husband. She doesn't have any children but has step children and grandchildren. She also has a dog and a cat.   Review of Systems    Reports that her health is about the same as last year.   Cardiac Risk Factors include: advanced age (>38men, >32 women);dyslipidemia;hypertension;sedentary lifestyle;obesity (BMI >30kg/m2)  Other systems negative.    Objective:    Today's Vitals   12/10/16 1438  BP: 134/79  Pulse: (!) 55  Weight: 188 lb (85.3 kg)  Height: 5\' 3"  (1.6 m)   Body mass index is 33.3 kg/m.   Current Medications (verified) Outpatient Encounter Prescriptions as of 12/10/2016  Medication Sig  . albuterol (PROVENTIL HFA;VENTOLIN HFA) 108 (90 Base) MCG/ACT inhaler Inhale 2 puffs into the lungs every 6 (six) hours as needed for wheezing or shortness of breath.  Marland Kitchen alendronate (FOSAMAX) 70 MG tablet TAKE ONE TABLET BY MOUTH ONCE A WEEK TAKE WITH A FULL GLASS OF WATER ON AN EMPTY STOMACH  . allopurinol (ZYLOPRIM) 300 MG tablet TAKE ONE TABLET BY MOUTH ONCE DAILY  . aspirin EC 81 MG tablet Take 81 mg by mouth daily.  . calcium citrate (CALCITRATE - DOSED IN MG ELEMENTAL CALCIUM) 950 MG tablet Take 200 mg of elemental calcium by mouth daily.  . Cholecalciferol (VITAMIN D) 2000 UNITS CAPS Take 1 capsule by mouth daily.  Mariane Baumgarten Calcium (STOOL SOFTENER PO) Take by mouth. Take 1-2 tablets prn  . fluticasone (FLONASE) 50 MCG/ACT nasal spray Place 2 sprays into both nostrils as needed.  Marland Kitchen lisinopril-hydrochlorothiazide (PRINZIDE,ZESTORETIC) 20-25 MG tablet TAKE ONE TABLET BY MOUTH ONCE DAILY  . methylcellulose (CITRUCEL) oral powder Take by mouth as needed.  . Multiple Vitamin (MULTIVITAMIN WITH MINERALS) TABS tablet Take 1 tablet by mouth  daily.  . pravastatin (PRAVACHOL) 80 MG tablet TAKE ONE TABLET BY MOUTH ONCE DAILY IN THE EVENING  . ranitidine (ZANTAC) 300 MG tablet TAKE 1 TABLET BY MOUTH IN THE EVENING  . [DISCONTINUED] alendronate (FOSAMAX) 70 MG tablet TAKE ONE TABLET BY MOUTH ONCE A WEEK TAKE WITH A FULL GLASS OF WATER ON AN EMPTY STOMACH  . [DISCONTINUED] alendronate (FOSAMAX) 70 MG tablet TAKE ONE TABLET BY MOUTH ONCE A WEEK TAKE WITH A FULL GLASS OF WATER ON AN EMPTY STOMACH  . [DISCONTINUED] allopurinol (ZYLOPRIM) 300 MG tablet TAKE ONE TABLET BY MOUTH ONCE DAILY  . [DISCONTINUED] azithromycin (ZITHROMAX) 250 MG tablet Take 2 the first day and then one each day after.  . [DISCONTINUED] cephALEXin (KEFLEX) 500 MG capsule Take 1 capsule (500 mg total) by mouth 3 (three) times daily.  . [DISCONTINUED] 0.9 %  sodium chloride infusion    No facility-administered encounter medications on file as of 12/10/2016.     Allergies (verified) Patient has no known allergies.   History: Past Medical History:  Diagnosis Date  . Compression fracture 05/24/2013   L1  . GERD (gastroesophageal reflux disease)   . Gout   . Hyperlipidemia   . Hypertension   . Knee fracture, left feb 2015   no surgery done  . Osteoporosis    Past Surgical History:  Procedure Laterality Date  . ABDOMINAL HYSTERECTOMY  1992   complete  . CHOLECYSTECTOMY N/A 06/28/2014   Procedure: LAPAROSCOPIC CHOLECYSTECTOMY ;  Surgeon: Fanny Skates, MD;  Location: [facility];  Service: General;  Laterality: N/A;  . EYE SURGERY Bilateral 2013   lens for cataracts  . LAMINECTOMY  1987   L5-S1  . TONSILLECTOMY  1961   Family History  Problem Relation Age of Onset  . Heart disease Mother   . Cancer Father        smoker  . Cancer Brother        lung and colon  . Colon cancer Brother   . COPD Brother   . Parkinson's disease Brother    Social History   Occupational History  . Not on file.   Social History Main Topics  . Smoking status: Former Smoker     Types: Cigarettes    Quit date: 02/24/1979  . Smokeless tobacco: Never Used     Comment: smoked 2-3 cig a day when smoking, quit long ago  . Alcohol use No  . Drug use: No  . Sexual activity: No    Tobacco Counseling No tobacco use  Activities of Daily Living In your present state of health, do you have any difficulty performing the following activities: 12/10/2016  Hearing? N  Vision? N  Difficulty concentrating or making decisions? N  Walking or climbing stairs? N  Dressing or bathing? N  Doing errands, shopping? N  Preparing Food and eating ? N  Using the Toilet? N  In the past six months, have you accidently leaked urine? Y  Do you have problems with loss of bowel control? N  Managing your Medications? N  Managing your Finances? N  Housekeeping or managing your Housekeeping? N  Some recent data might be hidden    Immunizations and Health Maintenance Immunization History  Administered Date(s) Administered  . Influenza, High Dose Seasonal PF 03/30/2016  . Influenza,inj,Quad PF,36+ Mos 04/02/2014, 05/07/2015  . Pneumococcal Conjugate-13 02/19/2014  . Pneumococcal Polysaccharide-23 03/30/2006   Health Maintenance Due  Topic Date Due  . TETANUS/TDAP  11/12/1958    Patient Care Team: Eustaquio Maize, MD as PCP - General (Pediatrics) Konrad Felix, MD as Referring Physician (Ophthalmology) Milus Banister, MD as Attending Physician (Gastroenterology)  No hospitalizations, ER visits, or surgeries this past year.     Assessment:   This is a routine wellness examination for Caitlyn Boone.   Hearing/Vision screen No hearing or vision deficits noted. Eye exam scheduled for 02/2017.  Dietary issues and exercise activities discussed: Current Exercise Habits: The patient does not participate in regular exercise at present, Exercise limited by: None identified  Diet: Eats 2 to 3 home prepared meals a day  Goals    . Exercise 150 minutes per week (moderate activity)           [facility] Sne[facility] program at the [facility]      Depression Screen PHQ 2/9 Scores 12/10/2016 08/27/2016 06/28/2016 04/22/2016 02/16/2016 08/06/2015 05/07/2015  PHQ - 2 Score 0 0 0 0 0 0 0    Fall Risk Fall Risk  12/10/2016 08/27/2016 06/28/2016 04/22/2016 02/16/2016  Falls in the past year? No No No No No  Number falls in past yr: - - - - -  Injury with Fall? - - - - -  Risk for fall due to : History of fall(s) - - - -    Cognitive Function: MMSE not completed. Patient was oriented x 3 and was able to answer all questions without difficulty.    Screening Tests Health Maintenance  Topic Date Due  . TETANUS/TDAP  11/12/1958  .  INFLUENZA VACCINE  01/05/2017  . DEXA SCAN  12/11/2018  . PNA vac Low Risk Adult  Completed      Plan:  -Recommended moderate activity for 30 minutes daily. Suggested Silver Sneakers Program at the [facility] -Keep appointment with Dr Evette Doffing 01/05/17 -Bring a copy of Advance Directives for [place] to put on file -Dexa done today  I have personally reviewed and noted the following in the patient's chart:   . Medical and social history . Use of alcohol, tobacco or illicit drugs  . Current medications and supplements . Functional ability and status . Nutritional status . Physical activity . Advanced directives . List of other physicians . Hospitalizations, surgeries, and ER visits in previous 12 months . Vitals . Screenings to include cognitive, depression, and falls . Referrals and appointments  In addition, I have reviewed and discussed with patient certain preventive protocols, quality metrics, and best practice recommendations. A written personalized care plan for preventive services as well as general preventive health recommendations were provided to patient.     Chong Sicilian, RN   12/15/2016

## 2016-12-21 DIAGNOSIS — H40013 Open angle with borderline findings, low risk, bilateral: Secondary | ICD-10-CM | POA: Diagnosis not present

## 2017-01-05 ENCOUNTER — Encounter: Payer: Self-pay | Admitting: Pediatrics

## 2017-01-05 ENCOUNTER — Ambulatory Visit (INDEPENDENT_AMBULATORY_CARE_PROVIDER_SITE_OTHER): Payer: Medicare Other | Admitting: Pediatrics

## 2017-01-05 VITALS — BP 130/82 | HR 62 | Temp 96.9°F | Ht 63.0 in | Wt 191.2 lb

## 2017-01-05 DIAGNOSIS — E785 Hyperlipidemia, unspecified: Secondary | ICD-10-CM

## 2017-01-05 DIAGNOSIS — K219 Gastro-esophageal reflux disease without esophagitis: Secondary | ICD-10-CM | POA: Diagnosis not present

## 2017-01-05 DIAGNOSIS — M810 Age-related osteoporosis without current pathological fracture: Secondary | ICD-10-CM | POA: Diagnosis not present

## 2017-01-05 DIAGNOSIS — I10 Essential (primary) hypertension: Secondary | ICD-10-CM | POA: Diagnosis not present

## 2017-01-05 DIAGNOSIS — E669 Obesity, unspecified: Secondary | ICD-10-CM | POA: Diagnosis not present

## 2017-01-05 MED ORDER — PRAVASTATIN SODIUM 80 MG PO TABS: ORAL_TABLET | ORAL | 3 refills | Status: DC

## 2017-01-05 MED ORDER — RANITIDINE HCL 300 MG PO TABS: 300.0000 mg | ORAL_TABLET | Freq: Two times a day (BID) | ORAL | 0 refills | Status: DC

## 2017-01-05 NOTE — Progress Notes (Signed)
  Subjective:   Patient ID: Caitlyn Boone, female    DOB: Sep 26, 1940, 76 y.o.   MRN: [id number] CC: med follow up HPI: Caitlyn Boone is a 76 y.o. female presenting for med follow up Mammogram UTD  Feeling well overall  Osteoporosis: taking fosamax   HLD: on statin, no s/e  HTN: no CP or SOB Taking meds regularly  BMI elevated: wt up apprx 8 lbs over past year Says she has been eating more sweets than she should Used to do silver sneakers regularly, helped with staying active  Relevant past medical, surgical, family and social history reviewed. Allergies and medications reviewed and updated. History  Smoking Status  . Former Smoker  . Types: Cigarettes  . Quit date: 02/24/1979  Smokeless Tobacco  . Never Used    Comment: smoked 2-3 cig a day when smoking, quit long ago   ROS: Per HPI   Objective:    BP 130/82   Pulse 62   Temp (!) 96.9 F (36.1 C) (Oral)   Ht _0  (1.6 m)   Wt 191 lb 3.2 oz (86.7 kg)   BMI 33.87 kg/m   Wt Readings from Last 3 Encounters:  01/05/17 191 lb 3.2 oz (86.7 kg)  12/10/16 188 lb (85.3 kg)  08/27/16 183 lb (83 kg)    Gen: NAD, alert, cooperative with exam, NCAT EYES: EOMI, no conjunctival injection, or no icterus ENT:  TMs pearly gray b/l, L ear canal slightly erythematous, OP without erythema LYMPH: no cervical LAD CV: NRRR, normal S1/S2, no murmur, distal pulses 2+ b/l Resp: CTABL, no wheezes, normal WOB Abd: +BS, soft, NTND. no guarding or organomegaly Ext: No edema, warm Neuro: Alert and oriented, strength equal b/l UE and LE, coordination grossly normal MSK: normal muscle bulk  Assessment & Plan:  [name] was seen today for med follow up  Diagnoses and all orders for this visit:  Essential hypertension Adequate control, cont current meds -     BMP8+EGFR  Hyperlipidemia, unspecified hyperlipidemia type Stable, cont statin -     pravastatin (PRAVACHOL) 80 MG tablet; TAKE ONE TABLET BY MOUTH ONCE DAILY IN THE  EVENING  Gastroesophageal reflux disease, esophagitis presence not specified Certain foods cause worse symptoms, can try twice a day, avoid exacerbating foods, if not improving let me know -     ranitidine (ZANTAC) 300 MG tablet; Take 1 tablet (300 mg total) by mouth 2 (two) times daily.  Age-related osteoporosis without current pathological fracture Cont fosamax  BMI 33 Avoid sugary foods, increase physical activity  Follow up plan: Return in about 6 months (around 07/08/2017) for med follow up. Assunta Found, MD [place]

## 2017-01-06 LAB — BMP8+EGFR
BUN / CREAT RATIO: 15 (ref 12–28)
BUN: 13 mg/dL (ref 8–27)
CALCIUM: 11 mg/dL — AB (ref 8.7–10.3)
CHLORIDE: 99 mmol/L (ref 96–106)
CO2: 29 mmol/L (ref 20–29)
Creatinine, Ser: 0.86 mg/dL (ref 0.57–1.00)
GFR, EST AFRICAN AMERICAN: 75 mL/min/{1.73_m2} (ref >59)
GFR, EST NON AFRICAN AMERICAN: 65 mL/min/{1.73_m2} (ref >59)
Glucose: 79 mg/dL (ref 65–99)
Potassium: 4.6 mmol/L (ref 3.5–5.2)
Sodium: 143 mmol/L (ref 134–144)

## 2017-01-24 ENCOUNTER — Other Ambulatory Visit: Payer: Self-pay | Admitting: Family

## 2017-01-24 DIAGNOSIS — I10 Essential (primary) hypertension: Secondary | ICD-10-CM

## 2017-03-08 ENCOUNTER — Other Ambulatory Visit: Payer: Self-pay | Admitting: Pediatrics

## 2017-03-08 DIAGNOSIS — M1A9XX0 Chronic gout, unspecified, without tophus (tophi): Secondary | ICD-10-CM

## 2017-03-18 ENCOUNTER — Other Ambulatory Visit: Payer: Self-pay | Admitting: Pediatrics

## 2017-03-21 ENCOUNTER — Ambulatory Visit (INDEPENDENT_AMBULATORY_CARE_PROVIDER_SITE_OTHER): Payer: Medicare Other

## 2017-03-21 DIAGNOSIS — Z23 Encounter for immunization: Secondary | ICD-10-CM

## 2017-04-13 ENCOUNTER — Other Ambulatory Visit: Payer: Self-pay | Admitting: Pediatrics

## 2017-04-13 DIAGNOSIS — K219 Gastro-esophageal reflux disease without esophagitis: Secondary | ICD-10-CM

## 2017-06-13 ENCOUNTER — Other Ambulatory Visit: Payer: Self-pay | Admitting: Pediatrics

## 2017-06-13 DIAGNOSIS — M1A9XX0 Chronic gout, unspecified, without tophus (tophi): Secondary | ICD-10-CM

## 2017-07-11 ENCOUNTER — Ambulatory Visit (INDEPENDENT_AMBULATORY_CARE_PROVIDER_SITE_OTHER): Payer: Medicare Other | Admitting: Pediatrics

## 2017-07-11 ENCOUNTER — Encounter: Payer: Self-pay | Admitting: Pediatrics

## 2017-07-11 VITALS — BP 130/89 | HR 55 | Temp 97.9°F | Ht 63.0 in | Wt 191.4 lb

## 2017-07-11 DIAGNOSIS — E785 Hyperlipidemia, unspecified: Secondary | ICD-10-CM

## 2017-07-11 DIAGNOSIS — K219 Gastro-esophageal reflux disease without esophagitis: Secondary | ICD-10-CM

## 2017-07-11 DIAGNOSIS — M810 Age-related osteoporosis without current pathological fracture: Secondary | ICD-10-CM | POA: Diagnosis not present

## 2017-07-11 DIAGNOSIS — I10 Essential (primary) hypertension: Secondary | ICD-10-CM | POA: Diagnosis not present

## 2017-07-11 DIAGNOSIS — M1A9XX0 Chronic gout, unspecified, without tophus (tophi): Secondary | ICD-10-CM | POA: Diagnosis not present

## 2017-07-11 MED ORDER — ALENDRONATE SODIUM 70 MG PO TABS: ORAL_TABLET | ORAL | 1 refills | Status: DC

## 2017-07-11 MED ORDER — LISINOPRIL-HYDROCHLOROTHIAZIDE 20-25 MG PO TABS: 1.0000 | ORAL_TABLET | Freq: Every day | ORAL | 1 refills | Status: DC

## 2017-07-11 MED ORDER — ALLOPURINOL 300 MG PO TABS: 300.0000 mg | ORAL_TABLET | Freq: Every day | ORAL | 1 refills | Status: DC

## 2017-07-11 MED ORDER — RANITIDINE HCL 300 MG PO TABS: 300.0000 mg | ORAL_TABLET | Freq: Two times a day (BID) | ORAL | 1 refills | Status: DC

## 2017-07-11 MED ORDER — PRAVASTATIN SODIUM 80 MG PO TABS: ORAL_TABLET | ORAL | 3 refills | Status: DC

## 2017-07-11 NOTE — Progress Notes (Signed)
  Subjective:   Patient ID: Caitlyn Boone, female    DOB: [birth year], 77 y.o.   MRN: [id number] CC: Follow-up (6 month) multiple med problems HPI: Caitlyn Boone is a 77 y.o. female presenting for Follow-up (6 month)  Osteoporosis: taking Vit D and calcium daily, alendronate once a week  HTN: taking meds regularly, no CP, no SOB  HLD: stable, takes statin regularly  GERD: takes ranitidine twice a day, has symptoms when she misses a dose  H/o gout: no recent flares, takes allopurinol regularly  Appetite has been fine  Relevant past medical, surgical, family and social history reviewed. Allergies and medications reviewed and updated. Social History   Tobacco Use  Smoking Status Former Smoker  . Types: Cigarettes  . Last attempt to quit: 02/24/1979  . Years since quitting: 38.4  Smokeless Tobacco Never Used  Tobacco Comment   smoked 2-3 cig a day when smoking, quit long ago   ROS: Per HPI   Objective:    BP 130/89   Pulse (!) 55   Temp 97.9 F (36.6 C) (Oral)   Ht 5\' 3"  (1.6 m)   Wt 191 lb 6.4 oz (86.8 kg)   BMI 33.90 kg/m   Wt Readings from Last 3 Encounters:  07/11/17 191 lb 6.4 oz (86.8 kg)  01/05/17 191 lb 3.2 oz (86.7 kg)  12/10/16 188 lb (85.3 kg)    Gen: NAD, alert, cooperative with exam, NCAT EYES: EOMI, no conjunctival injection, or no icterus CV: NRRR, normal S1/S2, no murmur, distal pulses 2+ b/l Resp: CTABL, no wheezes, normal WOB Abd: +BS, soft, NTND. no guarding or organomegaly Ext: No edema, warm Neuro: Alert and oriented, strength equal b/l UE and LE, coordination grossly normal MSK: normal muscle bulk  Assessment & Plan:  Lala was seen today for follow-up.  Diagnoses and all orders for this visit:  Osteoporosis, unspecified osteoporosis type, unspecified pathological fracture presence Cont below, if Ca level still high will need to decrease intake -     alendronate (FOSAMAX) 70 MG tablet; TAKE ONE TABLET BY MOUTH ONCE A WEEK TAKE WITH A FULL  GLASS OF WATER ON AN EMPTY STOMACH  Chronic gout without tophus, unspecified cause, unspecified site Stable, cont below -     allopurinol (ZYLOPRIM) 300 MG tablet; Take 1 tablet (300 mg total) by mouth daily.  Essential hypertension Stable, cont below -     lisinopril-hydrochlorothiazide (PRINZIDE,ZESTORETIC) 20-25 MG tablet; Take 1 tablet by mouth daily.  Hyperlipidemia, unspecified hyperlipidemia type Stable, cont below -     pravastatin (PRAVACHOL) 80 MG tablet; TAKE ONE TABLET BY MOUTH ONCE DAILY IN THE EVENING -     Lipid panel; Future  Gastroesophageal reflux disease, esophagitis presence not specified -     ranitidine (ZANTAC) 300 MG tablet; Take 1 tablet (300 mg total) by mouth 2 (two) times daily.  Hypercalcemia -     Basic Metabolic Panel; Future -     VITAMIN D 25 Hydroxy (Vit-D Deficiency, Fractures); Future   Follow up plan: Return in about 6 months (around 01/08/2018). Assunta Found, MD [place]

## 2017-07-12 ENCOUNTER — Other Ambulatory Visit: Payer: Medicare Other

## 2017-07-12 DIAGNOSIS — E785 Hyperlipidemia, unspecified: Secondary | ICD-10-CM

## 2017-07-13 LAB — BASIC METABOLIC PANEL
BUN / CREAT RATIO: 14 (ref 12–28)
BUN: 11 mg/dL (ref 8–27)
CO2: 28 mmol/L (ref 20–29)
CREATININE: 0.77 mg/dL (ref 0.57–1.00)
Calcium: 10.8 mg/dL — ABNORMAL HIGH (ref 8.7–10.3)
Chloride: 103 mmol/L (ref 96–106)
GFR calc Af Amer: 86 mL/min/{1.73_m2} (ref >59)
GFR, EST NON AFRICAN AMERICAN: 75 mL/min/{1.73_m2} (ref >59)
GLUCOSE: 93 mg/dL (ref 65–99)
POTASSIUM: 4.6 mmol/L (ref 3.5–5.2)
SODIUM: 146 mmol/L — AB (ref 134–144)

## 2017-07-13 LAB — LIPID PANEL
CHOLESTEROL TOTAL: 170 mg/dL (ref 100–199)
Chol/HDL Ratio: 3.2 ratio (ref 0.0–4.4)
HDL: 53 mg/dL (ref >39)
LDL CALC: 85 mg/dL (ref 0–99)
Triglycerides: 162 mg/dL — ABNORMAL HIGH (ref 0–149)
VLDL CHOLESTEROL CAL: 32 mg/dL (ref 5–40)

## 2017-07-13 LAB — VITAMIN D 25 HYDROXY (VIT D DEFICIENCY, FRACTURES): Vit D, 25-Hydroxy: 53.2 ng/mL (ref 30.0–100.0)

## 2017-07-18 ENCOUNTER — Ambulatory Visit (INDEPENDENT_AMBULATORY_CARE_PROVIDER_SITE_OTHER): Payer: Medicare Other | Admitting: Family Medicine

## 2017-07-18 ENCOUNTER — Encounter: Payer: Self-pay | Admitting: Family Medicine

## 2017-07-18 VITALS — BP 169/81 | HR 74 | Temp 97.0°F | Ht 63.0 in | Wt 188.0 lb

## 2017-07-18 DIAGNOSIS — M545 Low back pain, unspecified: Secondary | ICD-10-CM

## 2017-07-18 MED ORDER — DICLOFENAC SODIUM 75 MG PO TBEC: 75.0000 mg | DELAYED_RELEASE_TABLET | Freq: Two times a day (BID) | ORAL | 0 refills | Status: DC

## 2017-07-18 MED ORDER — TIZANIDINE HCL 2 MG PO TABS: 2.0000 mg | ORAL_TABLET | Freq: Four times a day (QID) | ORAL | 0 refills | Status: DC | PRN

## 2017-07-18 NOTE — Patient Instructions (Signed)
  If your back pain worsens, you develop any other worrisome symptoms or signs that we discussed (leg weakness, leg numbness or tingling, falls, difficulty urinating, fecal incontinence, please seek immediate medical attention in the emergency department.)  If your back pain does not improve in a timely manner, please return for reevaluation.  I have prescribed you to medication today to help with your low back pain:  Diclofenac 75 mg: Take 1 tablet by mouth twice a day with a meal as needed for low back pain.  Tizanidine 2 mg: Take 1 tablet by mouth every 6 hours as needed for muscle spasm/low back pain.  Caution, this medication can cause dizziness, sleepiness and increase her risk of falls.  Do not operate heavy machinery while taking this medicine.  You have prescribed a nonsteroidal anti-inflammatory drug (NSAID) today. This will help with ear pain and inflammation. Please do not take any other NSAIDs (ibuprofen/Motrin/Advil, naproxen/Aleve, meloxicam/Mobic, Voltaren/diclofenac). Please make sure to eat a meal when taking this medication.   Caution:  If you have a history of acid reflux/indigestion, I recommend that you take an antacid (such as Prilosec, Prevacid) daily while on the NSAID.  If you have a history of bleeding disorder, gastric ulcer, are on a blood thinner (like warfarin/Coumadin, Xarelto, Eliquis, etc) please do not take NSAID.  If you have ever had a heart attack, you should not take NSAIDs.

## 2017-07-18 NOTE — Progress Notes (Signed)
Subjective: CC: back pain HPI: Patient is a 77 y.o. female presenting to clinic today for back pain. Concerns today include:  1. Back Pain Patient reports that pain began after she lifted an oven door on Friday.  She describes the pain as a tight aching that is bilateral in the lumbar area.  She denies a h/o chronic back pain.  Patient has a known compression fracture at L1.  She has known osteoporosis and is on calcium, vitamin D and Fosamax for this.  Pain is slightly better than onset.  It does radiate.  Getting up from a seated position worsens pain.  Heat/ Ice improves pain.  Patient has been taking Motrin for pain with some relief.  Patient denies trauma or injury.  Denies dysuria, hematuria, fevers, chills, nausea, vomiting, abdominal pain, renal stones.   Denies saddle anesthesia, urinary retention/incontinence, bowel incontinence, weakness, falls, sensation changes or pain anywhere else. PMH negative for chronic back pain.  Past surgical history notable for laminectomy within the lumbosacral spine.    Current Outpatient Medications:  .  albuterol (PROVENTIL HFA;VENTOLIN HFA) 108 (90 Base) MCG/ACT inhaler, Inhale 2 puffs into the lungs every 6 (six) hours as needed for wheezing or shortness of breath. (Patient not taking: Reported on 07/11/2017), Disp: 1 Inhaler, Rfl: 2 .  alendronate (FOSAMAX) 70 MG tablet, TAKE ONE TABLET BY MOUTH ONCE A WEEK TAKE WITH A FULL GLASS OF WATER ON AN EMPTY STOMACH, Disp: 12 tablet, Rfl: 1 .  allopurinol (ZYLOPRIM) 300 MG tablet, Take 1 tablet (300 mg total) by mouth daily., Disp: 90 tablet, Rfl: 1 .  aspirin EC 81 MG tablet, Take 81 mg by mouth daily., Disp: , Rfl:  .  calcium citrate (CALCITRATE - DOSED IN MG ELEMENTAL CALCIUM) 950 MG tablet, Take 200 mg of elemental calcium by mouth daily., Disp: , Rfl:  .  Cholecalciferol (VITAMIN D) 2000 UNITS CAPS, Take 1 capsule by mouth daily., Disp: , Rfl:  .  Docusate Calcium (STOOL SOFTENER PO), Take by mouth.  Take 1-2 tablets prn, Disp: , Rfl:  .  fluticasone (FLONASE) 50 MCG/ACT nasal spray, Place 2 sprays into both nostrils as needed. (Patient not taking: Reported on 07/11/2017), Disp: 16 g, Rfl: 5 .  lisinopril-hydrochlorothiazide (PRINZIDE,ZESTORETIC) 20-25 MG tablet, Take 1 tablet by mouth daily., Disp: 90 tablet, Rfl: 1 .  methylcellulose (CITRUCEL) oral powder, Take by mouth as needed., Disp: , Rfl:  .  Multiple Vitamin (MULTIVITAMIN WITH MINERALS) TABS tablet, Take 1 tablet by mouth daily., Disp: , Rfl:  .  pravastatin (PRAVACHOL) 80 MG tablet, TAKE ONE TABLET BY MOUTH ONCE DAILY IN THE EVENING, Disp: 90 tablet, Rfl: 3 .  ranitidine (ZANTAC) 300 MG tablet, Take 1 tablet (300 mg total) by mouth 2 (two) times daily., Disp: 180 tablet, Rfl: 1  Past Medical History:  Diagnosis Date  . Compression fracture 05/24/2013   L1  . GERD (gastroesophageal reflux disease)   . Gout   . Hyperlipidemia   . Hypertension   . Knee fracture, left feb 2015   no surgery done  . Osteoporosis    Past Surgical History:  Procedure Laterality Date  . ABDOMINAL HYSTERECTOMY  1992   complete  . CHOLECYSTECTOMY N/A 06/28/2014   Procedure: LAPAROSCOPIC CHOLECYSTECTOMY ;  Surgeon: Fanny Skates, MD;  Location: [facility];  Service: General;  Laterality: N/A;  . EYE SURGERY Bilateral 2013   lens for cataracts  . LAMINECTOMY  1987   L5-S1  . TONSILLECTOMY  1961  No Known Allergies  ROS: Per HPI  Objective: Office vital signs reviewed. BP (!) 169/81   Pulse 74   Temp (!) 97 F (36.1 C) (Oral)   Ht 5\' 3"  (1.6 m)   Wt 188 lb (85.3 kg)   BMI 33.30 kg/m   Physical Examination:  General: Awake, alert, obese, NAD Extremities: Warm, well-perfused. No edema, cyanosis or clubbing; +2 pulses bilaterally MSK: antalgic gait and station.  She is currently using a rolling walker to assist with ambulation.  T- Spine: full AROM, no midline tenderness to palpation, no paraspinal tenderness to palpation.  No palpable  bony deformities  L-spine: Patient has limited active range of motion in flexion and extension.  No midline tenderness to palpation, minimal paraspinal muscle tenderness to palpation near the lumbosacral junctions.  Negative straight leg test Neuro: Light touch sensation grossly intact.  Heel and toe walks not assessed secondary to inability to cooperate with exam.  Assessment/ Plan: 77 y.o. female   1. Acute bilateral low back pain without sciatica No red flag symptoms or signs.  Likely paraspinal muscle spasm.  However, patient does have a history of osteoporosis and degenerative disc disease.  If symptoms do not improve with oral prescribed medications or last longer than expected course, would have low threshold to obtain imaging to further evaluate.  Home care instructions were reviewed with the patient.  She was prescribed Voltaren 75 mg p.o. twice daily with a meal as needed low back pain.  She was also prescribed Zanaflex 2 mg p.o. every 6 hours as needed back spasm and breakthrough back pain.  We did discuss the sedating effects of Zanaflex.  She voiced good understanding.  Her husband will be at home to assist her as needed.  She will follow-up as needed.    Janora Norlander, DO [place]

## 2017-08-22 ENCOUNTER — Other Ambulatory Visit: Payer: Self-pay | Admitting: Pediatrics

## 2017-08-22 DIAGNOSIS — Z1231 Encounter for screening mammogram for malignant neoplasm of breast: Secondary | ICD-10-CM

## 2017-08-23 DIAGNOSIS — H35033 Hypertensive retinopathy, bilateral: Secondary | ICD-10-CM | POA: Diagnosis not present

## 2017-08-23 DIAGNOSIS — H40013 Open angle with borderline findings, low risk, bilateral: Secondary | ICD-10-CM | POA: Diagnosis not present

## 2017-08-23 DIAGNOSIS — H26491 Other secondary cataract, right eye: Secondary | ICD-10-CM | POA: Diagnosis not present

## 2017-08-23 DIAGNOSIS — Z961 Presence of intraocular lens: Secondary | ICD-10-CM | POA: Diagnosis not present

## 2017-09-07 ENCOUNTER — Ambulatory Visit: Payer: Medicare Other

## 2017-09-07 ENCOUNTER — Ambulatory Visit
Admission: RE | Admit: 2017-09-07 | Discharge: 2017-09-07 | Disposition: A | Discharge status: Home / Self Care | Payer: Medicare Other | Source: Ambulatory Visit | Attending: Pediatrics | Admitting: Pediatrics

## 2017-09-07 DIAGNOSIS — Z1231 Encounter for screening mammogram for malignant neoplasm of breast: Secondary | ICD-10-CM | POA: Diagnosis not present

## 2017-12-14 ENCOUNTER — Ambulatory Visit: Payer: Medicare Other | Admitting: *Deleted

## 2017-12-28 ENCOUNTER — Ambulatory Visit (INDEPENDENT_AMBULATORY_CARE_PROVIDER_SITE_OTHER): Payer: Medicare Other | Admitting: *Deleted

## 2017-12-28 ENCOUNTER — Encounter: Payer: Self-pay | Admitting: *Deleted

## 2017-12-28 VITALS — BP 143/83 | HR 55 | Ht 62.25 in | Wt 187.0 lb

## 2017-12-28 DIAGNOSIS — Z0000 Encounter for general adult medical examination without abnormal findings: Secondary | ICD-10-CM | POA: Diagnosis not present

## 2017-12-28 NOTE — Progress Notes (Signed)
Subjective:   Caitlyn Boone is a pleasant 77 y.o. female who presents for a Medicare Annual Wellness Visit. Caitlyn Boone lives at home with her husband who accompanies her today. They are both retired. She does not have any biological children but her husband has 1 daughter and 3 sons. They have several grandchildren and great grandchildren. She watches one of the great granddaughters every Thursday. She enjoys spending time with the grandkids. She stays active around her home and garden.     Review of Systems    Patient reports that her overall health is unchanged compared to last year.  Cardiac Risk Factors include: advanced age (>51men, >57 women);hypertension;obesity (BMI >30kg/m2);sedentary lifestyle;dyslipidemia    All other systems negative       Current Medications (verified) Outpatient Encounter Medications as of 12/28/2017  Medication Sig  . alendronate (FOSAMAX) 70 MG tablet TAKE ONE TABLET BY MOUTH ONCE A WEEK TAKE WITH A FULL GLASS OF WATER ON AN EMPTY STOMACH  . allopurinol (ZYLOPRIM) 300 MG tablet Take 1 tablet (300 mg total) by mouth daily.  Marland Kitchen aspirin EC 81 MG tablet Take 81 mg by mouth daily.  . Cholecalciferol (VITAMIN D) 2000 UNITS CAPS Take 1 capsule by mouth daily.  . fluticasone (FLONASE) 50 MCG/ACT nasal spray Place 2 sprays into both nostrils as needed.  Marland Kitchen lisinopril-hydrochlorothiazide (PRINZIDE,ZESTORETIC) 20-25 MG tablet Take 1 tablet by mouth daily.  . methylcellulose (CITRUCEL) oral powder Take by mouth as needed.  . Multiple Vitamin (MULTIVITAMIN WITH MINERALS) TABS tablet Take 1 tablet by mouth daily.  . Omega-3 Fatty Acids (FISH OIL) 1000 MG CAPS Take 1 capsule by mouth.  . pravastatin (PRAVACHOL) 80 MG tablet TAKE ONE TABLET BY MOUTH ONCE DAILY IN THE EVENING  . ranitidine (ZANTAC) 300 MG tablet Take 1 tablet (300 mg total) by mouth 2 (two) times daily.  Marland Kitchen albuterol (PROVENTIL HFA;VENTOLIN HFA) 108 (90 Base) MCG/ACT inhaler Inhale 2 puffs into the lungs  every 6 (six) hours as needed for wheezing or shortness of breath. (Patient not taking: Reported on 07/18/2017)  . calcium citrate (CALCITRATE - DOSED IN MG ELEMENTAL CALCIUM) 950 MG tablet Take 200 mg of elemental calcium by mouth daily.  . diclofenac (VOLTAREN) 75 MG EC tablet Take 1 tablet (75 mg total) by mouth 2 (two) times daily.  Mariane Boone Calcium (STOOL SOFTENER PO) Take by mouth. Take 1-2 tablets prn  . tiZANidine (ZANAFLEX) 2 MG tablet Take 1 tablet (2 mg total) by mouth every 6 (six) hours as needed for muscle spasms ((breakthrough low back pain)). (Patient not taking: Reported on 12/28/2017)   No facility-administered encounter medications on file as of 12/28/2017.     Allergies (verified) Patient has no known allergies.   History: Past Medical History:  Diagnosis Date  . Compression fracture 05/24/2013   L1  . GERD (gastroesophageal reflux disease)   . Gout   . Hyperlipidemia   . Hypertension   . Knee fracture, left feb 2015   no surgery done  . Osteoporosis    Past Surgical History:  Procedure Laterality Date  . ABDOMINAL HYSTERECTOMY  1992   complete  . CHOLECYSTECTOMY N/A 06/28/2014   Procedure: LAPAROSCOPIC CHOLECYSTECTOMY ;  Surgeon: Fanny Skates, MD;  Location: [facility];  Service: General;  Laterality: N/A;  . EYE SURGERY Bilateral 2013   lens for cataracts  . LAMINECTOMY  1987   L5-S1  . TONSILLECTOMY  1961   Family History  Problem Relation Age of Onset  .  Heart disease Mother   . Cancer Father        smoker  . Cancer Brother        lung and colon  . Colon cancer Brother   . Hip fracture Brother   . Dementia Brother 75  . COPD Brother   . Parkinson's disease Brother    Social History   Socioeconomic History  . Marital status: Married    Spouse name: Not on file  . Number of children: 0  . Years of education: 11  . Highest education level: 12th grade  Occupational History  . Occupation: Retired  Scientific laboratory technician  . Financial resource strain:  Not hard at all  . Food insecurity:    Worry: Never true    Inability: Never true  . Transportation needs:    Medical: No    Non-medical: No  Tobacco Use  . Smoking status: Former Smoker    Types: Cigarettes    Last attempt to quit: 02/24/1979    Years since quitting: 38.8  . Smokeless tobacco: Never Used  . Tobacco comment: smoked 2-3 cig a day when smoking, quit long ago  Substance and Sexual Activity  . Alcohol use: No    Alcohol/week: 0.0 oz  . Drug use: No  . Sexual activity: Never  Lifestyle  . Physical activity:    Days per week: 5 days    Minutes per session: 20 min  . Stress: Not at all  Relationships  . Social connections:    Talks on phone: More than three times a week    Gets together: More than three times a week    Attends religious service: More than 4 times per year    Active member of club or organization: Yes    Attends meetings of clubs or organizations: More than 4 times per year    Relationship status: Married  Other Topics Concern  . Not on file  Social History Narrative  . Not on file    Tobacco Use No.  Clinical Intake:     Pain : No/denies pain     Nutritional Status: BMI > 30  Obese Diabetes: No  How often do you need to have someone help you when you read instructions, pamphlets, or other written materials from your doctor or pharmacy?: 1 - Never What is the last grade level you completed in school?: 12     Information entered by :: Chong Sicilian, RN   Activities of Daily Living In your present state of health, do you have any difficulty performing the following activities: 12/28/2017  Hearing? N  Vision? N  Difficulty concentrating or making decisions? N  Walking or climbing stairs? N  Dressing or bathing? N  Doing errands, shopping? N  Preparing Food and eating ? N  Using the Toilet? N  In the past six months, have you accidently leaked urine? N  Do you have problems with loss of bowel control? N  Managing your  Medications? N  Managing your Finances? N  Housekeeping or managing your Housekeeping? N  Some recent data might be hidden     Diet 3 meals a day and some snacks at times. Prepare most meals at home.  Drinks mostly water  Exercise Current Exercise Habits: The patient does not participate in regular exercise at present(works around home and yard), Exercise limited by: orthopedic condition(s)   Depression Screen PHQ 2/9 Scores 07/18/2017 07/11/2017 01/05/2017 12/10/2016 08/27/2016 06/28/2016 04/22/2016  PHQ - 2 Score 0 0  0 0 0 0 0     Fall Risk Fall Risk  12/28/2017 07/18/2017 07/11/2017 01/05/2017 12/10/2016  Falls in the past year? No No No No No  Number falls in past yr: - - - - -  Injury with Fall? - - - - -  Risk for fall due to : - - - History of fall(s) History of fall(s)    Safety Is the patient's home free of loose throw rugs in walkways, pet beds, electrical cords, etc?   yes      Grab bars in the bathroom? yes      Walkin shower? yes      Shower Seat? yes      Handrails on the stairs?   yes      Adequate lighting?   yes  Patient Care Team: Eustaquio Maize, MD as PCP - General (Pediatrics) Konrad Felix, MD as Referring Physician (Ophthalmology) Milus Banister, MD as Attending Physician (Gastroenterology)  Hospitalizations, surgeries, and ER visits in previous 12 months No hospitalizations, ER visits, or surgeries this past year.   Objective:    Today's Vitals   12/28/17 1020 12/28/17 1025  BP: (!) 179/81 (!) 143/83  Pulse: 61 (!) 55  Weight: 187 lb (84.8 kg)   Height: 5' 2.25" (1.581 m)    Body mass index is 33.93 kg/m.  Advanced Directives 12/28/2017 06/26/2014 05/21/2014  Does Patient Have a Medical Advance Directive? Yes Yes No  Type of Paramedic of [place];Living will -  Does patient want to make changes to medical advance directive? No - Patient declined No - Patient declined -  Copy of Bartholomew in Chart? Yes Yes -  Would patient like information on creating a medical advance directive? No - Patient declined - -    Hearing/Vision  normal or No deficits noted during visit.  Cognitive Function: MMSE - Mini Mental State Exam 12/28/2017  Orientation to time 5  Orientation to Place 5  Registration 3  Attention/ Calculation 5  Recall 3  Language- name 2 objects 2  Language- repeat 1  Language- follow 3 step command 3  Language- read & follow direction 1  Write a sentence 1  Copy design 1  Total score 30       Normal Cognitive Function Screening: Yes    Immunizations and Health Maintenance Immunization History  Administered Date(s) Administered  . Influenza, High Dose Seasonal PF 03/30/2016, 03/21/2017  . Influenza,inj,Quad PF,6+ Mos 04/02/2014, 05/07/2015  . Pneumococcal Conjugate-13 02/19/2014  . Pneumococcal Polysaccharide-23 03/30/2006   There are no preventive care reminders to display for this patient. Health Maintenance  Topic Date Due  . TETANUS/TDAP  07/11/2018 (Originally 11/12/1959)  . INFLUENZA VACCINE  01/05/2018  . DEXA SCAN  12/11/2018  . PNA vac Low Risk Adult  Completed        Assessment:   This is a routine wellness examination for Julicia.    Plan:    Goals    . Exercise 150 min/wk Moderate Activity        Health Maintenance Recommendations: Td vaccine   Additional Screening Recommendations: Lung: Low Dose CT Chest recommended if Age 92-80 years, 30 pack-year currently smoking OR have quit w/in 15years. Patient does not qualify. Hepatitis C Screening recommended: no  Today's Orders No orders of the defined types were placed in this encounter.   Keep f/u with Eustaquio Maize, MD and any other specialty appointments you  may have Continue current medications Move carefully to avoid falls. Use assistive devices like a can or walker if needed. Aim for at least 150 minutes of moderate activity a week. This can be done  with chair exercises if necessary. Read or work on puzzles daily Stay connected with friends and family  I have personally reviewed and noted the following in the patient's chart:   . Medical and social history . Use of alcohol, tobacco or illicit drugs  . Current medications and supplements . Functional ability and status . Nutritional status . Physical activity . Advanced directives . List of other physicians . Hospitalizations, surgeries, and ER visits in previous 12 months . Vitals . Screenings to include cognitive, depression, and falls . Referrals and appointments  In addition, I have reviewed and discussed with patient certain preventive protocols, quality metrics, and best practice recommendations. A written personalized care plan for preventive services as well as general preventive health recommendations were provided to patient.     Chong Sicilian, RN   12/28/2017

## 2017-12-28 NOTE — Patient Instructions (Signed)
  Ms. Dembeck , Thank you for taking time to come for your Medicare Wellness Visit. I appreciate your ongoing commitment to your health goals. Please review the following plan we discussed and let me know if I can assist you in the future.   These are the goals we discussed: Goals    . Exercise 150 min/wk Moderate Activity       This is a list of the screening recommended for you and due dates:  Health Maintenance  Topic Date Due  . Tetanus Vaccine  07/11/2018*  . Flu Shot  01/05/2018  . DEXA scan (bone density measurement)  12/11/2018  . Pneumonia vaccines  Completed  *Topic was postponed. The date shown is not the original due date.

## 2018-01-12 ENCOUNTER — Other Ambulatory Visit: Payer: Self-pay | Admitting: Pediatrics

## 2018-01-12 ENCOUNTER — Ambulatory Visit: Payer: Medicare Other | Admitting: Pediatrics

## 2018-01-12 DIAGNOSIS — K219 Gastro-esophageal reflux disease without esophagitis: Secondary | ICD-10-CM

## 2018-01-16 ENCOUNTER — Ambulatory Visit (INDEPENDENT_AMBULATORY_CARE_PROVIDER_SITE_OTHER): Payer: Medicare Other | Admitting: Pediatrics

## 2018-01-16 ENCOUNTER — Encounter: Payer: Self-pay | Admitting: Pediatrics

## 2018-01-16 VITALS — BP 137/81 | HR 64 | Temp 96.8°F | Ht 62.25 in | Wt 181.0 lb

## 2018-01-16 DIAGNOSIS — M1A9XX0 Chronic gout, unspecified, without tophus (tophi): Secondary | ICD-10-CM | POA: Diagnosis not present

## 2018-01-16 DIAGNOSIS — M810 Age-related osteoporosis without current pathological fracture: Secondary | ICD-10-CM | POA: Diagnosis not present

## 2018-01-16 DIAGNOSIS — I10 Essential (primary) hypertension: Secondary | ICD-10-CM

## 2018-01-16 MED ORDER — LISINOPRIL-HYDROCHLOROTHIAZIDE 20-25 MG PO TABS: 1.0000 | ORAL_TABLET | Freq: Every day | ORAL | 1 refills | Status: DC

## 2018-01-16 MED ORDER — ALLOPURINOL 300 MG PO TABS: 300.0000 mg | ORAL_TABLET | Freq: Every day | ORAL | 1 refills | Status: DC

## 2018-01-16 MED ORDER — ALENDRONATE SODIUM 70 MG PO TABS: ORAL_TABLET | ORAL | 1 refills | Status: DC

## 2018-01-16 NOTE — Progress Notes (Signed)
  Subjective:   Patient ID: Caitlyn Boone, female    DOB: May 16, 1941, 77 y.o.   MRN: [id number] CC: Medical Management of Chronic Issues  HPI: Caitlyn Boone is a 77 y.o. female   Hypertension: Taking medicines regularly.  No shortness of breath, chest pain.  Keeping 37-year-old great granddaughter 1 day a week, has been keeping her active.  Osteoporosis: Taking alendronate once a week.  Hypercalcemia: Continues to take vitamin D.  Getting calcium through diet.   Relevant past medical, surgical, family and social history reviewed. Allergies and medications reviewed and updated. Social History   Tobacco Use  Smoking Status Former Smoker  . Types: Cigarettes  . Last attempt to quit: 02/24/1979  . Years since quitting: 38.9  Smokeless Tobacco Never Used  Tobacco Comment   smoked 2-3 cig a day when smoking, quit long ago   ROS: Per HPI   Objective:    BP 137/81   Pulse 64   Temp (!) 96.8 F (36 C) (Oral)   Ht 5' 2.25" (1.581 m)   Wt 181 lb (82.1 kg)   BMI 32.84 kg/m   Wt Readings from Last 3 Encounters:  01/16/18 181 lb (82.1 kg)  12/28/17 187 lb (84.8 kg)  07/18/17 188 lb (85.3 kg)    Gen: NAD, alert, cooperative with exam, NCAT EYES: EOMI, no conjunctival injection, or no icterus ENT:   OP without erythema LYMPH: no cervical LAD Neck: No bruits CV: NRRR, normal S1/S2, no murmur, distal pulses 2+ b/l Resp: CTABL, no wheezes, normal WOB Abd: +BS, soft, NTND. Ext: No edema, warm Neuro: Alert and oriented, strength equal b/l UE and LE, coordination grossly normal MSK: normal muscle bulk  Assessment & Plan:  Caitlyn Boone was seen today for medical management of chronic issues.  Diagnoses and all orders for this visit:  Essential hypertension Stable, continue below. -     lisinopril-hydrochlorothiazide (PRINZIDE,ZESTORETIC) 20-25 MG tablet; Take 1 tablet by mouth daily.  Chronic gout without tophus, unspecified cause, unspecified site Stable, continue below -      allopurinol (ZYLOPRIM) 300 MG tablet; Take 1 tablet (300 mg total) by mouth daily.  Osteoporosis, unspecified osteoporosis type, unspecified pathological fracture presence Stable, continue below. -     alendronate (FOSAMAX) 70 MG tablet; TAKE ONE TABLET BY MOUTH ONCE A WEEK TAKE WITH A FULL GLASS OF WATER ON AN EMPTY STOMACH  Hypercalcemia We will repeat calcium level, also send PTH.  Patient also on hydrochlorothiazide, may need to switch to alternate. -     Basic Metabolic Panel -     Parathyroid hormone, intact (no Ca)   Follow up plan: Return in about 6 months (around 07/19/2018). Assunta Found, MD [place]

## 2018-01-17 LAB — BASIC METABOLIC PANEL
BUN/Creatinine Ratio: 15 (ref 12–28)
BUN: 12 mg/dL (ref 8–27)
CALCIUM: 10.8 mg/dL — AB (ref 8.7–10.3)
CO2: 28 mmol/L (ref 20–29)
CREATININE: 0.79 mg/dL (ref 0.57–1.00)
Chloride: 101 mmol/L (ref 96–106)
GFR calc Af Amer: 83 mL/min/{1.73_m2} (ref >59)
GFR, EST NON AFRICAN AMERICAN: 72 mL/min/{1.73_m2} (ref >59)
Glucose: 97 mg/dL (ref 65–99)
POTASSIUM: 4 mmol/L (ref 3.5–5.2)
Sodium: 142 mmol/L (ref 134–144)

## 2018-01-17 LAB — PARATHYROID HORMONE, INTACT (NO CA): PTH: 32 pg/mL (ref 15–65)

## 2018-02-13 ENCOUNTER — Encounter: Payer: Self-pay | Admitting: Family Medicine

## 2018-02-13 ENCOUNTER — Ambulatory Visit (INDEPENDENT_AMBULATORY_CARE_PROVIDER_SITE_OTHER): Payer: Medicare Other | Admitting: Family Medicine

## 2018-02-13 VITALS — BP 152/82 | HR 73 | Temp 99.9°F | Ht 62.25 in | Wt 182.2 lb

## 2018-02-13 DIAGNOSIS — J0100 Acute maxillary sinusitis, unspecified: Secondary | ICD-10-CM | POA: Diagnosis not present

## 2018-02-13 DIAGNOSIS — R05 Cough: Secondary | ICD-10-CM

## 2018-02-13 DIAGNOSIS — R059 Cough, unspecified: Secondary | ICD-10-CM

## 2018-02-13 MED ORDER — AMOXICILLIN-POT CLAVULANATE 875-125 MG PO TABS: 1.0000 | ORAL_TABLET | Freq: Two times a day (BID) | ORAL | 0 refills | Status: DC

## 2018-02-13 MED ORDER — BETAMETHASONE SOD PHOS & ACET 6 (3-3) MG/ML IJ SUSP
6.0000 mg | Freq: Once | INTRAMUSCULAR | Status: AC
  Administered 2018-02-13: 6 mg via INTRAMUSCULAR

## 2018-02-13 NOTE — Progress Notes (Signed)
Chief Complaint  Patient presents with  . Cough    pt here today c/o cough, congestion, headache and "teeth hurting"    HPI  Patient presents today for Patient presents with upper respiratory congestion. Rhinorrhea that is frequently purulent. There is moderate sore throat. Patient reports coughing frequently as well.  yellow sputum noted. There is no fever, chills or sweats. The patient denies being short of breath. Onset was 7 days ago. Gradually worsening. Tried OTCs without improvement. Right upper teeth hurt  PMH: Smoking status noted ROS: Per HPI  Objective: BP (!) 152/82   Pulse 73   Temp 99.9 F (37.7 C) (Oral)   Ht 5' 2.25" (1.581 m)   Wt 182 lb 4 oz (82.7 kg)   BMI 33.07 kg/m  Gen: NAD, alert, cooperative with exam HEENT: NCAT, Nasal passages swollen, red TMS RED CV: RRR, good S1/S2, no murmur Resp: Bronchitis changes with scattered wheezes, non-labored Ext: No edema, warm Neuro: Alert and oriented, No gross deficits  Assessment and plan:  1. Cough   2. Acute maxillary sinusitis, recurrence not specified     Meds ordered this encounter  Medications  . betamethasone acetate-betamethasone sodium phosphate (CELESTONE) injection 6 mg  . amoxicillin-clavulanate (AUGMENTIN) 875-125 MG tablet    Sig: Take 1 tablet by mouth 2 (two) times daily.    Dispense:  20 tablet    Refill:  0    No orders of the defined types were placed in this encounter.   Follow up as needed.  Claretta Fraise, MD

## 2018-02-13 NOTE — Patient Instructions (Signed)
Use Mucinex DM for symptoms

## 2018-02-14 DIAGNOSIS — H40013 Open angle with borderline findings, low risk, bilateral: Secondary | ICD-10-CM | POA: Diagnosis not present

## 2018-02-24 ENCOUNTER — Ambulatory Visit (INDEPENDENT_AMBULATORY_CARE_PROVIDER_SITE_OTHER): Payer: Medicare Other | Admitting: Pediatrics

## 2018-02-24 ENCOUNTER — Encounter: Payer: Self-pay | Admitting: Pediatrics

## 2018-02-24 VITALS — BP 136/85 | HR 70 | Temp 96.8°F | Ht 62.25 in | Wt 183.8 lb

## 2018-02-24 DIAGNOSIS — H8302 Labyrinthitis, left ear: Secondary | ICD-10-CM

## 2018-02-24 DIAGNOSIS — I10 Essential (primary) hypertension: Secondary | ICD-10-CM

## 2018-02-24 MED ORDER — LISINOPRIL 30 MG PO TABS: 30.0000 mg | ORAL_TABLET | Freq: Every day | ORAL | 1 refills | Status: DC

## 2018-02-24 NOTE — Patient Instructions (Signed)
Use flonase two sprays once a day  If not improving over next couple days let me know  If stomach pain returns let me know  Stop lisinopril/HCTZ  Start new blood pressure medicine when you pick it up

## 2018-02-24 NOTE — Progress Notes (Signed)
  Subjective:   Patient ID: Caitlyn Boone, female    DOB: 1940-09-07, 77 y.o.   MRN: [id number] CC: Dizziness and Emesis  HPI: Caitlyn Boone is a 77 y.o. female   Patient was seen 10 days ago for acute sinusitis symptoms, cough.  Treated with Augmentin for 10 days, last use yesterday.  Her congestion has slowly gotten better.  She continues to have pain in her right ear, some pressure and fullness.  This morning she woke up feeling dizzy, to call her husband to come home.  She threw up a couple times.  She is feeling better now.  For last 2 days she has had some pain in her right/flank side.  Pain is resolved now.  No dysuria, no urinary urgency or abdominal pain.  She has been having regular bowel movements.  Appetite is been fine.  No fevers in the last few days.  Relevant past medical, surgical, family and social history reviewed. Allergies and medications reviewed and updated. Social History   Tobacco Use  Smoking Status Former Smoker  . Types: Cigarettes  . Last attempt to quit: 02/24/1979  . Years since quitting: 39.0  Smokeless Tobacco Never Used  Tobacco Comment   smoked 2-3 cig a day when smoking, quit long ago   ROS: Per HPI   Objective:    BP 136/85   Pulse 70   Temp (!) 96.8 F (36 C) (Oral)   Ht 5' 2.25" (1.581 m)   Wt 183 lb 12.8 oz (83.4 kg)   BMI 33.35 kg/m   Wt Readings from Last 3 Encounters:  02/24/18 183 lb 12.8 oz (83.4 kg)  02/13/18 182 lb 4 oz (82.7 kg)  01/16/18 181 lb (82.1 kg)    Gen: NAD, alert, cooperative with exam, NCAT, congested. EYES: EOMI, no conjunctival injection, or no icterus ENT: Right TM pink with layering yellow effusion, dull.  Left TM pink, dull, smaller yellow effusion. OP without erythema LYMPH: no cervical LAD CV: NRRR, normal S1/S2, no murmur, distal pulses 2+ b/l Resp: CTABL, no wheezes, normal WOB Abd: +BS, soft, NTND. no guarding or rebound Ext: No edema, warm Neuro: Alert and oriented, strength equal b/l UE and LE,  coordination grossly normal  Assessment & Plan:  Krisna was seen today for dizziness and emesis.  Diagnoses and all orders for this visit:  Labyrinthitis of left ear Start Flonase to help improve middle ear drainage.  Has finished a 10-day course of Augmentin yesterday.   Essential hypertension Given persistent hypercalcemia with normal PTH only trial off of hydrochlorothiazide to see if improves.  Next visit will check vitamin D level, repeat BMP. -     lisinopril (PRINIVIL,ZESTRIL) 30 MG tablet; Take 1 tablet (30 mg total) by mouth daily.  Follow up plan: Return in about 2 months (around 04/26/2018). Assunta Found, MD [place]

## 2018-03-01 ENCOUNTER — Telehealth: Payer: Self-pay | Admitting: Pediatrics

## 2018-03-01 DIAGNOSIS — I10 Essential (primary) hypertension: Secondary | ICD-10-CM

## 2018-03-01 MED ORDER — LISINOPRIL 40 MG PO TABS: 40.0000 mg | ORAL_TABLET | Freq: Every day | ORAL | 1 refills | Status: DC

## 2018-03-01 NOTE — Telephone Encounter (Signed)
Patient is still having dizziness and is concerned about blood pressure.  She has been taking Lisinopril and Flonase.   Her readings have been:  Sunday morning 169/76 pulse 55, evening 151/80 pulse 61 Monday morning 165/86 pulse 63, evening 154/79 pulse 65 Tuesday morning 160/84 pulse 56, evening 141/76 68 Today 153/81 pulse 55  Please advise.

## 2018-03-01 NOTE — Telephone Encounter (Signed)
I want to increase blood pressure medicine, I sent lisinopril 40mg  into pharmacy. Can she check again and let me know if persistently elevated a week after starting?

## 2018-03-01 NOTE — Telephone Encounter (Signed)
Could not reach patient, fast busy signal

## 2018-03-02 NOTE — Telephone Encounter (Signed)
N/A- No VM. 

## 2018-03-02 NOTE — Telephone Encounter (Signed)
Pt calling about for nurse please call back at [phone]

## 2018-03-08 ENCOUNTER — Other Ambulatory Visit: Payer: Self-pay | Admitting: Pediatrics

## 2018-03-08 ENCOUNTER — Ambulatory Visit (INDEPENDENT_AMBULATORY_CARE_PROVIDER_SITE_OTHER): Payer: Medicare Other | Admitting: Pediatrics

## 2018-03-08 ENCOUNTER — Ambulatory Visit (INDEPENDENT_AMBULATORY_CARE_PROVIDER_SITE_OTHER): Payer: Medicare Other

## 2018-03-08 ENCOUNTER — Encounter: Payer: Self-pay | Admitting: Pediatrics

## 2018-03-08 VITALS — BP 178/93 | HR 78 | Temp 97.5°F | Ht 62.25 in | Wt 182.6 lb

## 2018-03-08 DIAGNOSIS — R0789 Other chest pain: Secondary | ICD-10-CM

## 2018-03-08 DIAGNOSIS — M25562 Pain in left knee: Secondary | ICD-10-CM

## 2018-03-08 DIAGNOSIS — S82092A Other fracture of left patella, initial encounter for closed fracture: Secondary | ICD-10-CM | POA: Diagnosis not present

## 2018-03-08 DIAGNOSIS — W19XXXA Unspecified fall, initial encounter: Secondary | ICD-10-CM

## 2018-03-08 DIAGNOSIS — H65111 Acute and subacute allergic otitis media (mucoid) (sanguinous) (serous), right ear: Secondary | ICD-10-CM

## 2018-03-08 DIAGNOSIS — S299XXA Unspecified injury of thorax, initial encounter: Secondary | ICD-10-CM | POA: Diagnosis not present

## 2018-03-08 DIAGNOSIS — I10 Essential (primary) hypertension: Secondary | ICD-10-CM

## 2018-03-08 DIAGNOSIS — Z23 Encounter for immunization: Secondary | ICD-10-CM

## 2018-03-08 DIAGNOSIS — S82002A Unspecified fracture of left patella, initial encounter for closed fracture: Secondary | ICD-10-CM

## 2018-03-08 MED ORDER — AMLODIPINE BESYLATE 5 MG PO TABS: 5.0000 mg | ORAL_TABLET | Freq: Every day | ORAL | 3 refills | Status: DC

## 2018-03-08 MED ORDER — AZITHROMYCIN 250 MG PO TABS: ORAL_TABLET | ORAL | 0 refills | Status: DC

## 2018-03-08 NOTE — Telephone Encounter (Signed)
Pt seen in clinic today, addressed.

## 2018-03-08 NOTE — Progress Notes (Signed)
Subjective:   Patient ID: Caitlyn Boone, female    DOB: 1941-02-24, 77 y.o.   MRN: [id number] CC: Hypertension; Fall; Knee Pain (Left); and Chest Pain (Right sided)  HPI: Caitlyn Boone is a 77 y.o. female   Hypertension: HCTZ recently DC'd because of hypercalcemia.  Lisinopril increased from 20 to 40 mg.  Systolic blood pressures remain elevated, 160s 180s at home.  Has had some headaches off and on.  Fall: At home in the bedroom yesterday tripped over a cane that she had put in a bad location, hit her left knee hard on hardwood floor, hit her right upper chest hard on a post.  Knee feels sore now, hurts when she bends it.  Slightly swollen.  Some pain in her right upper chest when she takes a deep breath.  She does not usually feel unsteady on her feet.  She is been using a walker more for last 24 hours because she is worried about falling again.  She has had some ringing in her ears since recent URI and congestion.  Continues to feel like her ears are stopped up.  Was recently treated with Augmentin for 10 days, last day 9/19.  Ear is not improved and possibly is worsened since then.  Right ear bothering her the most.  Relevant past medical, surgical, family and social history reviewed. Allergies and medications reviewed and updated. Social History   Tobacco Use  Smoking Status Former Smoker  . Types: Cigarettes  . Last attempt to quit: 02/24/1979  . Years since quitting: 39.0  Smokeless Tobacco Never Used  Tobacco Comment   smoked 2-3 cig a day when smoking, quit long ago   ROS: Per HPI   Objective:    BP (!) 178/93   Pulse 78   Temp (!) 97.5 F (36.4 C) (Oral)   Ht 5' 2.25" (1.581 m)   Wt 182 lb 9.6 oz (82.8 kg)   BMI 33.13 kg/m   Wt Readings from Last 3 Encounters:  03/08/18 182 lb 9.6 oz (82.8 kg)  02/24/18 183 lb 12.8 oz (83.4 kg)  02/13/18 182 lb 4 oz (82.7 kg)    Gen: NAD, alert, cooperative with exam, NCAT EYES: EOMI, no conjunctival injection, or no icterus ENT:  Left TM pink, layering white effusion.  Right TM red, injected with large white-yellow effusion.  OP without erythema LYMPH: no cervical LAD CV: NRRR, normal S1/S2, no murmur, distal pulses 2+ b/l Resp: CTABL, no wheezes, normal WOB Ext: No edema, warm Neuro: Alert and oriented, strength equal b/l UE and LE, coordination grossly normal MSK: Tender to palpation upper right chest.  Small amount of light red bruising.  Palpation over mid sternum.  No tenderness over spine.  Left knee slightly swollen compared to right, slightly red.  Tender palpation over patella.  Decreased range of motion left knee compared to right.  Assessment & Plan:  Caitlyn Boone was seen today for hypertension, fall, knee pain and chest pain.  Diagnoses and all orders for this visit:  Essential hypertension Persistently elevated at home.  Continue lisinopril 40 mg, start below.  Continue to check at home.  Let me know if regularly elevated. -     amLODipine (NORVASC) 5 MG tablet; Take 1 tablet (5 mg total) by mouth daily.  Acute mucoid otitis media of right ear Will treat with below.  Return precautions discussed. -     azithromycin (ZITHROMAX) 250 MG tablet; Take 2 the first day and then one each day after.  Fall, initial encounter We will get x-ray given tenderness.  Offered referral to physical therapy for gait training and strength training to avoid future falls.  Patient declined for now. -     DG Chest 2 View; Future -     DG Knee 1-2 Views Left; Future   Follow up plan: As scheduled in 6 weeks Assunta Found, MD [place]

## 2018-03-09 ENCOUNTER — Ambulatory Visit (INDEPENDENT_AMBULATORY_CARE_PROVIDER_SITE_OTHER): Payer: Medicare Other | Admitting: Orthopaedic Surgery

## 2018-03-09 ENCOUNTER — Encounter (INDEPENDENT_AMBULATORY_CARE_PROVIDER_SITE_OTHER): Payer: Self-pay | Admitting: Orthopaedic Surgery

## 2018-03-09 DIAGNOSIS — M25562 Pain in left knee: Secondary | ICD-10-CM | POA: Diagnosis not present

## 2018-03-09 DIAGNOSIS — S82035A Nondisplaced transverse fracture of left patella, initial encounter for closed fracture: Secondary | ICD-10-CM

## 2018-03-09 DIAGNOSIS — S82002A Unspecified fracture of left patella, initial encounter for closed fracture: Secondary | ICD-10-CM | POA: Insufficient documentation

## 2018-03-09 NOTE — Progress Notes (Signed)
Office Visit Note   Patient: Caitlyn Boone           Date of Birth: [birth year]           MRN: [id number] Visit Date: 03/09/2018              Requested by: Eustaquio Maize, MD [place], [place] [address] PCP: Eustaquio Maize, MD   Assessment & Plan: Visit Diagnoses:  1. Closed nondisplaced transverse fracture of left patella, initial encounter     Plan: Impression is nondisplaced left patella fracture.  X-rays were reviewed with the patient and this should heal with nonoperative treatment without any issues down the road.  For now we will put her in a knee immobilizer until she can get into a Bledsoe brace from 0 to 45 degrees.  Recheck in 2 weeks with 2 view x-rays of the left knee.  Follow-Up Instructions: Return in about 2 weeks (around 03/23/2018).   Orders:  No orders of the defined types were placed in this encounter.  No orders of the defined types were placed in this encounter.     Procedures: No procedures performed   Clinical Data: No additional findings.   Subjective: Chief Complaint  Patient presents with  . Left Knee - Pain, Fracture    Caitlyn Boone is a very pleasant 77 year old female who comes in with an acute injury to her left patella from a couple days ago when she tripped over her cane and fell directly onto the patella.  She denies any loss of consciousness or neck pain.  She was originally evaluated at her primary care physician's office and was sent here for further evaluation and treatment.  Denies any numbness and tingling.  She does endorse some swelling and pain.   Review of Systems  Constitutional: Negative.   HENT: Negative.   Eyes: Negative.   Respiratory: Negative.   Cardiovascular: Negative.   Endocrine: Negative.   Musculoskeletal: Negative.   Neurological: Negative.   Hematological: Negative.   Psychiatric/Behavioral: Negative.   All other systems reviewed and are negative.    Objective: Vital Signs: There were no  vitals taken for this visit.  Physical Exam  Constitutional: She is oriented to person, place, and time. She appears well-developed and well-nourished.  Pulmonary/Chest: Effort normal.  Neurological: She is alert and oriented to person, place, and time.  Skin: Skin is warm. Capillary refill takes less than 2 seconds.  Psychiatric: She has a normal mood and affect. Her behavior is normal. Judgment and thought content normal.  Nursing note and vitals reviewed.   Ortho Exam Left knee exam shows a small joint effusion.  Her extensor mechanism is intact. Specialty Comments:  No specialty comments available.  Imaging: Dg Chest 2 View  Result Date: 03/08/2018 CLINICAL DATA:  Fall yesterday, injury to RIGHT-sided chest. EXAM: CHEST - 2 VIEW COMPARISON:  Chest x-ray dated 07/11/2015. FINDINGS: Heart size and mediastinal contours are stable. Lungs are clear. No pleural effusion or pneumothorax seen. No evidence of rib fracture or displacement seen. There is a chronic compression fracture deformity at the L1 vertebral body level, stable compared to the previous chest x-ray of 07/11/2015. There is an additional chronic appearing compression fracture deformity of the T11 vertebral body, new compared to the previous study but most likely old based on appearance. IMPRESSION: 1. No active cardiopulmonary disease. Lungs are clear. No pleural effusion or pneumothorax seen. 2. No evidence of rib fracture or displacement seen. 3. Chronic appearing  compression fracture deformity of the T11 vertebral body, new compared to chest x-ray of 07/11/2015 but most likely old based on appearance. If any acute pain in the midline at the thoracolumbar junction, could consider MRI to confirm age. Electronically Signed   By: Franki Cabot M.D.   On: 03/08/2018 14:25   Dg Knee 1-2 Views Left  Result Date: 03/08/2018 CLINICAL DATA:  Fall yesterday, LEFT knee pain. EXAM: LEFT KNEE - 1-2 VIEW COMPARISON:  None. FINDINGS: On the  lateral projection, a slightly displaced fracture is noted within the lower pole of the patella. Associated joint effusion within the suprapatellar joint space. Distal femur appears intact and normally aligned. Proximal tibia and fibula appear intact and normally aligned. No significant degenerative change seen. IMPRESSION: 1. Slightly displaced fracture within the lower pole of the LEFT patella. Associated joint effusion. 2. Distal femur, proximal tibia and proximal fibula appear intact and normally aligned. Electronically Signed   By: Franki Cabot M.D.   On: 03/08/2018 14:26     PMFS History: Patient Active Problem List   Diagnosis Date Noted  . Closed nondisplaced fracture of left patella 03/09/2018  . Obesity (BMI 30.0-34.9) 02/16/2016  . Gallstones 06/28/2014  . Hyperlipidemia 02/19/2014  . Osteoporosis 02/19/2014  . Essential hypertension 02/19/2014  . GERD (gastroesophageal reflux disease) 02/19/2014  . Gout 02/19/2014   Past Medical History:  Diagnosis Date  . Compression fracture 05/24/2013   L1  . GERD (gastroesophageal reflux disease)   . Gout   . Hyperlipidemia   . Hypertension   . Knee fracture, left feb 2015   no surgery done  . Osteoporosis     Family History  Problem Relation Age of Onset  . Heart disease Mother   . Cancer Father        smoker  . Cancer Brother        lung and colon  . Colon cancer Brother   . Hip fracture Brother   . Dementia Brother 24  . COPD Brother   . Parkinson's disease Brother     Past Surgical History:  Procedure Laterality Date  . ABDOMINAL HYSTERECTOMY  1992   complete  . CHOLECYSTECTOMY N/A 06/28/2014   Procedure: LAPAROSCOPIC CHOLECYSTECTOMY ;  Surgeon: Fanny Skates, MD;  Location: [facility];  Service: General;  Laterality: N/A;  . EYE SURGERY Bilateral 2013   lens for cataracts  . LAMINECTOMY  1987   L5-S1  . TONSILLECTOMY  1961   Social History   Occupational History  . Occupation: Retired  Tobacco Use  . Smoking  status: Former Smoker    Types: Cigarettes    Last attempt to quit: 02/24/1979    Years since quitting: 39.0  . Smokeless tobacco: Never Used  . Tobacco comment: smoked 2-3 cig a day when smoking, quit long ago  Substance and Sexual Activity  . Alcohol use: No    Alcohol/week: 0.0 standard drinks  . Drug use: No  . Sexual activity: Never

## 2018-03-23 ENCOUNTER — Encounter (INDEPENDENT_AMBULATORY_CARE_PROVIDER_SITE_OTHER): Payer: Self-pay | Admitting: Orthopaedic Surgery

## 2018-03-23 ENCOUNTER — Ambulatory Visit (INDEPENDENT_AMBULATORY_CARE_PROVIDER_SITE_OTHER): Payer: Medicare Other | Admitting: Orthopaedic Surgery

## 2018-03-23 ENCOUNTER — Ambulatory Visit (INDEPENDENT_AMBULATORY_CARE_PROVIDER_SITE_OTHER): Payer: Self-pay

## 2018-03-23 DIAGNOSIS — S82035A Nondisplaced transverse fracture of left patella, initial encounter for closed fracture: Secondary | ICD-10-CM | POA: Diagnosis not present

## 2018-03-23 NOTE — Progress Notes (Signed)
     Patient: Caitlyn Boone           Date of Birth: [birth year]           MRN: [id number] Visit Date: 03/23/2018 PCP: Eustaquio Maize, MD   Assessment & Plan:  Chief Complaint:  Chief Complaint  Patient presents with  . Left Knee - Pain   Visit Diagnoses:  1. Closed nondisplaced transverse fracture of left patella, initial encounter     Plan: [name] presents today 2 weeks status post nondisplaced patella fracture.  She is better since the last visit.  She just complains about the Bledsoe brace sliding down overall her swelling and bruising are significantly improved.  Extensor mechanism intact.  X-rays demonstrate stable alignment of fracture without any displacement.  At this point we will continue with 0 to 45 degrees in the Bledsoe brace.  Weight-bear as tolerated with a walker.  Recheck in 2 weeks with 2 view x-rays of the left knee.  Anticipate advancing her to 60 degrees at that time.  Follow-Up Instructions: Return in about 6 weeks (around 05/04/2018).   Orders:  Orders Placed This Encounter  Procedures  . XR Knee 1-2 Views Left   No orders of the defined types were placed in this encounter.   Imaging: Xr Knee 1-2 Views Left  Result Date: 03/23/2018 Stable nondisplaced patella fracture without any displacement   PMFS History: Patient Active Problem List   Diagnosis Date Noted  . Closed nondisplaced fracture of left patella 03/09/2018  . Obesity (BMI 30.0-34.9) 02/16/2016  . Gallstones 06/28/2014  . Hyperlipidemia 02/19/2014  . Osteoporosis 02/19/2014  . Essential hypertension 02/19/2014  . GERD (gastroesophageal reflux disease) 02/19/2014  . Gout 02/19/2014   Past Medical History:  Diagnosis Date  . Compression fracture 05/24/2013   L1  . GERD (gastroesophageal reflux disease)   . Gout   . Hyperlipidemia   . Hypertension   . Knee fracture, left feb 2015   no surgery done  . Osteoporosis     Family History  Problem Relation Age of Onset  . Heart  disease Mother   . Cancer Father        smoker  . Cancer Brother        lung and colon  . Colon cancer Brother   . Hip fracture Brother   . Dementia Brother 41  . COPD Brother   . Parkinson's disease Brother     Past Surgical History:  Procedure Laterality Date  . ABDOMINAL HYSTERECTOMY  1992   complete  . CHOLECYSTECTOMY N/A 06/28/2014   Procedure: LAPAROSCOPIC CHOLECYSTECTOMY ;  Surgeon: Fanny Skates, MD;  Location: [facility];  Service: General;  Laterality: N/A;  . EYE SURGERY Bilateral 2013   lens for cataracts  . LAMINECTOMY  1987   L5-S1  . TONSILLECTOMY  1961   Social History   Occupational History  . Occupation: Retired  Tobacco Use  . Smoking status: Former Smoker    Types: Cigarettes    Last attempt to quit: 02/24/1979    Years since quitting: 39.1  . Smokeless tobacco: Never Used  . Tobacco comment: smoked 2-3 cig a day when smoking, quit long ago  Substance and Sexual Activity  . Alcohol use: No    Alcohol/week: 0.0 standard drinks  . Drug use: No  . Sexual activity: Never

## 2018-04-06 ENCOUNTER — Ambulatory Visit (INDEPENDENT_AMBULATORY_CARE_PROVIDER_SITE_OTHER): Payer: Self-pay

## 2018-04-06 ENCOUNTER — Ambulatory Visit (INDEPENDENT_AMBULATORY_CARE_PROVIDER_SITE_OTHER): Payer: Medicare Other | Admitting: Physician Assistant

## 2018-04-06 DIAGNOSIS — S82035A Nondisplaced transverse fracture of left patella, initial encounter for closed fracture: Secondary | ICD-10-CM

## 2018-04-06 NOTE — Progress Notes (Signed)
Post-Op Visit Note   Patient: Caitlyn Boone           Date of Birth: January 07, 1941           MRN: [id number] Visit Date: 04/06/2018 PCP: Eustaquio Maize, MD   Assessment & Plan:  Chief Complaint: No chief complaint on file.  Visit Diagnoses:  1. Closed nondisplaced transverse fracture of left patella, initial encounter     Plan: Patient is a pleasant 77 year old female who presents to our clinic today approximately 4 weeks out nondisplaced transverse fracture left patella.  She has been compliant wearing her hinged knee brace.  I have noted today that the left side was at 75 and the right side was fully open to the 120.  She has minimal pain.  She has been using a rolling walker.  Lamination of the left knee reveals very minimal tenderness over the fracture site.  She is able to fully extend the knee.  At this point, I locked her brace at 60 degrees both sides.  She will continue to use her rolling walker and follow-up with [place] in 4 weeks time for repeat evaluation and x-ray.  Call with concerns or questions in the meantime.  Follow-Up Instructions: Return in about 4 weeks (around 05/04/2018).   Orders:  Orders Placed This Encounter  Procedures  . XR Knee 1-2 Views Left   No orders of the defined types were placed in this encounter.   Imaging: Xr Knee 1-2 Views Left  Result Date: 04/06/2018 X-rays demonstrate stable alignment of the fracture with evidence of bony ingrowth   PMFS History: Patient Active Problem List   Diagnosis Date Noted  . Closed nondisplaced fracture of left patella 03/09/2018  . Obesity (BMI 30.0-34.9) 02/16/2016  . Gallstones 06/28/2014  . Hyperlipidemia 02/19/2014  . Osteoporosis 02/19/2014  . Essential hypertension 02/19/2014  . GERD (gastroesophageal reflux disease) 02/19/2014  . Gout 02/19/2014   Past Medical History:  Diagnosis Date  . Compression fracture 05/24/2013   L1  . GERD (gastroesophageal reflux disease)   . Gout   .  Hyperlipidemia   . Hypertension   . Knee fracture, left feb 2015   no surgery done  . Osteoporosis     Family History  Problem Relation Age of Onset  . Heart disease Mother   . Cancer Father        smoker  . Cancer Brother        lung and colon  . Colon cancer Brother   . Hip fracture Brother   . Dementia Brother 45  . COPD Brother   . Parkinson's disease Brother     Past Surgical History:  Procedure Laterality Date  . ABDOMINAL HYSTERECTOMY  1992   complete  . CHOLECYSTECTOMY N/A 06/28/2014   Procedure: LAPAROSCOPIC CHOLECYSTECTOMY ;  Surgeon: Fanny Skates, MD;  Location: [facility];  Service: General;  Laterality: N/A;  . EYE SURGERY Bilateral 2013   lens for cataracts  . LAMINECTOMY  1987   L5-S1  . TONSILLECTOMY  1961   Social History   Occupational History  . Occupation: Retired  Tobacco Use  . Smoking status: Former Smoker    Types: Cigarettes    Last attempt to quit: 02/24/1979    Years since quitting: 39.1  . Smokeless tobacco: Never Used  . Tobacco comment: smoked 2-3 cig a day when smoking, quit long ago  Substance and Sexual Activity  . Alcohol use: No    Alcohol/week: 0.0 standard drinks  .  Drug use: No  . Sexual activity: Never

## 2018-04-19 ENCOUNTER — Telehealth: Payer: Self-pay | Admitting: Pediatrics

## 2018-04-19 NOTE — Telephone Encounter (Signed)
What symptoms do you have? Swollen foot, and ankle too for about a month and pain she knows she has gout, hurts to walk on it it is red and hot she has a brace on where she fractured her knee cap hard to get in and out of cars  How long have you been sick? About a month  Have you been seen for this problem? No just wants medication   If your provider decides to give you a prescription, which pharmacy would you like for it to be sent to? [facility]   Patient informed that this information will be sent to the clinical staff for review and that they should receive a follow up call.

## 2018-04-19 NOTE — Telephone Encounter (Signed)
Aware.  Need to call orthopedics and get an earlier appointment to be seen due to swelling and pain.

## 2018-04-21 ENCOUNTER — Telehealth (INDEPENDENT_AMBULATORY_CARE_PROVIDER_SITE_OTHER): Payer: Self-pay | Admitting: Orthopaedic Surgery

## 2018-04-21 MED ORDER — PREDNISONE 10 MG (21) PO TBPK: ORAL_TABLET | ORAL | 0 refills | Status: DC

## 2018-04-21 NOTE — Telephone Encounter (Signed)
Prednisone dose pak please

## 2018-04-21 NOTE — Telephone Encounter (Signed)
Sent to pharmacy. Patient advised.  

## 2018-04-21 NOTE — Telephone Encounter (Signed)
Patient called stating that she is having a Gout Flare up and wanted Dr. Erlinda Hong to call her in an RX to her pharmacy.  CB#[phone]

## 2018-04-21 NOTE — Telephone Encounter (Signed)
Please advise 

## 2018-04-27 ENCOUNTER — Encounter: Payer: Self-pay | Admitting: Pediatrics

## 2018-04-27 ENCOUNTER — Ambulatory Visit (INDEPENDENT_AMBULATORY_CARE_PROVIDER_SITE_OTHER): Payer: Medicare Other | Admitting: Pediatrics

## 2018-04-27 ENCOUNTER — Other Ambulatory Visit: Payer: Self-pay | Admitting: Pediatrics

## 2018-04-27 VITALS — BP 137/85 | HR 93 | Temp 97.0°F | Ht 62.25 in | Wt 186.6 lb

## 2018-04-27 DIAGNOSIS — M810 Age-related osteoporosis without current pathological fracture: Secondary | ICD-10-CM | POA: Diagnosis not present

## 2018-04-27 DIAGNOSIS — K219 Gastro-esophageal reflux disease without esophagitis: Secondary | ICD-10-CM

## 2018-04-27 DIAGNOSIS — M1A9XX0 Chronic gout, unspecified, without tophus (tophi): Secondary | ICD-10-CM | POA: Diagnosis not present

## 2018-04-27 DIAGNOSIS — R609 Edema, unspecified: Secondary | ICD-10-CM

## 2018-04-27 MED ORDER — ALLOPURINOL 300 MG PO TABS: 300.0000 mg | ORAL_TABLET | Freq: Every day | ORAL | 1 refills | Status: DC

## 2018-04-27 MED ORDER — FUROSEMIDE 20 MG PO TABS: 20.0000 mg | ORAL_TABLET | Freq: Every day | ORAL | 3 refills | Status: DC | PRN

## 2018-04-27 MED ORDER — RANITIDINE HCL 300 MG PO TABS: 300.0000 mg | ORAL_TABLET | Freq: Two times a day (BID) | ORAL | 0 refills | Status: DC

## 2018-04-27 NOTE — Progress Notes (Signed)
  Subjective:   Patient ID: Caitlyn Boone, female    DOB: 31-Dec-1940, 77 y.o.   MRN: [id number] CC: Medical Management of Chronic Issues  HPI: Caitlyn Boone is a 77 y.o. female   Ankle swelling: Ongoing for the few weeks, since her left knee has been in a knee stabilizer for patellar fracture.  She tries to keep her foot elevated.  Using walker to get around.  Both ankle swell, left ankle more than right ankle.  No redness or tenderness in the lower leg.  No shortness of breath or chest pain.  Gout: Recently treated with prednisone for red, swollen left ankle.  She said it felt like similar gout attacks.  Doing much better now.  Taking allopurinol regularly.  Sometimes eats hamburgers, otherwise avoiding exacerbating foods.  Does not drink alcohol.  Osteoporosis: Taking alendronate once a week, no side effects  Hypertension: Taking blood pressure medicine daily.  GERD: Taking H2 blocker regularly.  No side effects.  Does help with symptoms.  Relevant past medical, surgical, family and social history reviewed. Allergies and medications reviewed and updated. Social History   Tobacco Use  Smoking Status Former Smoker  . Types: Cigarettes  . Last attempt to quit: 02/24/1979  . Years since quitting: 39.1  Smokeless Tobacco Never Used  Tobacco Comment   smoked 2-3 cig a day when smoking, quit long ago   ROS: Per HPI   Objective:    BP 137/85   Pulse 93   Temp (!) 97 F (36.1 C) (Oral)   Ht 5' 2.25" (1.581 m)   Wt 186 lb 9.6 oz (84.6 kg)   BMI 33.86 kg/m   Wt Readings from Last 3 Encounters:  04/27/18 186 lb 9.6 oz (84.6 kg)  03/08/18 182 lb 9.6 oz (82.8 kg)  02/24/18 183 lb 12.8 oz (83.4 kg)     Gen: NAD, alert, cooperative with exam, NCAT EYES: EOMI, no conjunctival injection, or no icterus ENT:  OP without erythema LYMPH: no cervical LAD CV: NRRR, normal S1/S2, no murmur, distal pulses 2+ b/l Resp: CTABL, no wheezes, normal WOB Abd: +BS, soft, NTND. no guarding or  organomegaly Ext: Trace to 1+ pitting edema bilateral ankles.  Left slightly greater than right.  No redness of skin or warmth Neuro: Alert and oriented MSK: Left knee immobilizer in place.  Assessment & Plan:  Caitlyn Boone was seen today for medical management of chronic issues.  Diagnoses and all orders for this visit:  Osteoporosis, unspecified osteoporosis type, unspecified pathological fracture presence Stable, continue alendronate  Gastroesophageal reflux disease, esophagitis presence not specified Stable, continue below -     ranitidine (ZANTAC) 300 MG tablet; Take 1 tablet (300 mg total) by mouth 2 (two) times daily.  Chronic gout without tophus, unspecified cause, unspecified site Recent flare.  Continue allopurinol.  Recheck uric acid level next visit, outside window with active flare. -     allopurinol (ZYLOPRIM) 300 MG tablet; Take 1 tablet (300 mg total) by mouth daily.  Swelling Okay to take below 3 days in a row for swelling present in the morning.  If not improving and for any worsening let me know. -     furosemide (LASIX) 20 MG tablet; Take 1 tablet (20 mg total) by mouth daily as needed (for swelling present in the morning).   Follow up plan: Return in about 3 months (around 07/28/2018). Assunta Found, MD [place]

## 2018-04-28 MED ORDER — FAMOTIDINE 20 MG PO TABS: 20.0000 mg | ORAL_TABLET | Freq: Two times a day (BID) | ORAL | 1 refills | Status: DC

## 2018-04-29 ENCOUNTER — Other Ambulatory Visit: Payer: Self-pay | Admitting: Pediatrics

## 2018-04-29 DIAGNOSIS — K219 Gastro-esophageal reflux disease without esophagitis: Secondary | ICD-10-CM

## 2018-05-01 NOTE — Addendum Note (Signed)
Addended by: Antonietta Barcelona D on: 05/01/2018 02:27 PM   Modules accepted: Orders

## 2018-05-03 ENCOUNTER — Ambulatory Visit (INDEPENDENT_AMBULATORY_CARE_PROVIDER_SITE_OTHER): Payer: Self-pay

## 2018-05-03 ENCOUNTER — Encounter (INDEPENDENT_AMBULATORY_CARE_PROVIDER_SITE_OTHER): Payer: Self-pay | Admitting: Orthopaedic Surgery

## 2018-05-03 ENCOUNTER — Ambulatory Visit (INDEPENDENT_AMBULATORY_CARE_PROVIDER_SITE_OTHER): Payer: Medicare Other | Admitting: Orthopaedic Surgery

## 2018-05-03 DIAGNOSIS — G8929 Other chronic pain: Secondary | ICD-10-CM | POA: Diagnosis not present

## 2018-05-03 DIAGNOSIS — S82035A Nondisplaced transverse fracture of left patella, initial encounter for closed fracture: Secondary | ICD-10-CM | POA: Diagnosis not present

## 2018-05-03 DIAGNOSIS — M25562 Pain in left knee: Secondary | ICD-10-CM | POA: Diagnosis not present

## 2018-05-03 NOTE — Progress Notes (Signed)
Office Visit Note   Patient: Caitlyn Boone           Date of Birth: 11-29-1940           MRN: [id number] Visit Date: 05/03/2018              Requested by: Eustaquio Maize, MD [place], [place] [address] PCP: Eustaquio Maize, MD   Assessment & Plan: Visit Diagnoses:  1. Closed nondisplaced transverse fracture of left patella, initial encounter   2. Chronic pain of left knee     Plan: Impression is nearly healed transverse fracture left patella.  Patient can discontinue the locked knee brace.  She will start range of motion exercises as tolerated.  We did discuss physical therapy, but she would like to do this on her own as she does not think her insurance covers it.  She will follow-up with [place] in 4 weeks time for final x-ray.  Follow-Up Instructions: Return in about 4 weeks (around 05/31/2018).   Orders:  Orders Placed This Encounter  Procedures  . XR KNEE 3 VIEW LEFT   No orders of the defined types were placed in this encounter.     Procedures: No procedures performed   Clinical Data: No additional findings.   Subjective: Chief Complaint  Patient presents with  . Left Knee - Pain, Follow-up    HPI patient is a pleasant 77 year old female presents to our clinic today 8 weeks out transverse fracture left patella, date of injury 03/07/2018.  She has been doing excellent.  She has been wearing her hinged knee brace but does appear to be taking this off occasionally at home.  No pain.  Overall doing very well.  Review of Systems as detailed in HPI.  All others reviewed and are negative.   Objective: Vital Signs: There were no vitals taken for this visit.  Physical Exam well-developed well-nourished female no acute distress.  Alert and oriented x3.  Ortho Exam examination of the left knee shows no effusion.  No tenderness over the patella.  Range of motion 0 to 110 degrees.  She is neurovascularly intact distally.  Specialty Comments:  No specialty  comments available.  Imaging: Xr Knee 3 View Left  Result Date: 05/03/2018 X-rays demonstrate stable alignment of the fracture with significant healing    PMFS History: Patient Active Problem List   Diagnosis Date Noted  . Closed nondisplaced fracture of left patella 03/09/2018  . Obesity (BMI 30.0-34.9) 02/16/2016  . Gallstones 06/28/2014  . Hyperlipidemia 02/19/2014  . Osteoporosis 02/19/2014  . Essential hypertension 02/19/2014  . GERD (gastroesophageal reflux disease) 02/19/2014  . Gout 02/19/2014   Past Medical History:  Diagnosis Date  . Compression fracture 05/24/2013   L1  . GERD (gastroesophageal reflux disease)   . Gout   . Hyperlipidemia   . Hypertension   . Knee fracture, left feb 2015   no surgery done  . Osteoporosis     Family History  Problem Relation Age of Onset  . Heart disease Mother   . Cancer Father        smoker  . Cancer Brother        lung and colon  . Colon cancer Brother   . Hip fracture Brother   . Dementia Brother 2  . COPD Brother   . Parkinson's disease Brother     Past Surgical History:  Procedure Laterality Date  . ABDOMINAL HYSTERECTOMY  1992   complete  . CHOLECYSTECTOMY  N/A 06/28/2014   Procedure: LAPAROSCOPIC CHOLECYSTECTOMY ;  Surgeon: Fanny Skates, MD;  Location: [facility];  Service: General;  Laterality: N/A;  . EYE SURGERY Bilateral 2013   lens for cataracts  . LAMINECTOMY  1987   L5-S1  . TONSILLECTOMY  1961   Social History   Occupational History  . Occupation: Retired  Tobacco Use  . Smoking status: Former Smoker    Types: Cigarettes    Last attempt to quit: 02/24/1979    Years since quitting: 39.2  . Smokeless tobacco: Never Used  . Tobacco comment: smoked 2-3 cig a day when smoking, quit long ago  Substance and Sexual Activity  . Alcohol use: No    Alcohol/week: 0.0 standard drinks  . Drug use: No  . Sexual activity: Never

## 2018-06-01 ENCOUNTER — Encounter (INDEPENDENT_AMBULATORY_CARE_PROVIDER_SITE_OTHER): Payer: Self-pay | Admitting: Orthopaedic Surgery

## 2018-06-01 ENCOUNTER — Ambulatory Visit (INDEPENDENT_AMBULATORY_CARE_PROVIDER_SITE_OTHER): Payer: Medicare Other | Admitting: Orthopaedic Surgery

## 2018-06-01 ENCOUNTER — Ambulatory Visit (INDEPENDENT_AMBULATORY_CARE_PROVIDER_SITE_OTHER): Payer: Self-pay

## 2018-06-01 DIAGNOSIS — M25562 Pain in left knee: Secondary | ICD-10-CM

## 2018-06-01 DIAGNOSIS — S82035A Nondisplaced transverse fracture of left patella, initial encounter for closed fracture: Secondary | ICD-10-CM | POA: Diagnosis not present

## 2018-06-01 NOTE — Progress Notes (Signed)
Office Visit Note   Patient: Caitlyn Boone           Date of Birth: [birth year]           MRN: [id number] Visit Date: 06/01/2018              Requested by: Eustaquio Maize, MD [place], [place] [address] PCP: Eustaquio Maize, MD   Assessment & Plan: Visit Diagnoses:  1. Closed nondisplaced transverse fracture of left patella, initial encounter   2. Left knee pain, unspecified chronicity     Plan: Impression is healed left knee transverse patella fracture.  Patient is doing well in regards to her left knee.  She will follow-up with [place] as needed.  I recommended compression socks for her left lower extremity.  If she should start to exhibit pain to the calf she will call us immediately.  She should develop pain to the left foot she will follow-up with [place].  Follow-Up Instructions: Return if symptoms worsen or fail to improve.   Orders:  Orders Placed This Encounter  Procedures  . XR KNEE 3 VIEW LEFT   No orders of the defined types were placed in this encounter.     Procedures: No procedures performed   Clinical Data: No additional findings.   Subjective: Chief Complaint  Patient presents with  . Left Knee - Follow-up    HPI patient is a pleasant 77 year old female presents to our clinic today nearly 12 weeks out left patella fracture, date of injury 03/07/2018.  She has been doing very well.  No complaints about the left knee.  She politely declined attending physical therapy at her last visit, but has been working on a home exercise program.  She does complain of some swelling to her left foot which is been ongoing since the patella fracture.  This is causing her no pain.  This does subside with elevation.  No calf pain.  Review of Systems as detailed in HPI.  All others reviewed and are negative.   Objective: Vital Signs: There were no vitals taken for this visit.  Physical Exam well-developed well-nourished female in no acute distress.  Alert and oriented  x3.  Ortho Exam examination of her left knee shows no swelling.  No tenderness.  Full range of motion and strength.  She is neurovascularly intact distally.  Examination left foot shows mild to moderate swelling.  No tenderness.  Full range of motion.  Calf is soft and nontender.  She is neurovascularly intact distally.  Specialty Comments:  No specialty comments available.  Imaging: Xr Knee 3 View Left  Result Date: 06/01/2018 Healed transverse fracture left patella    PMFS History: Patient Active Problem List   Diagnosis Date Noted  . Closed nondisplaced fracture of left patella 03/09/2018  . Obesity (BMI 30.0-34.9) 02/16/2016  . Gallstones 06/28/2014  . Hyperlipidemia 02/19/2014  . Osteoporosis 02/19/2014  . Essential hypertension 02/19/2014  . GERD (gastroesophageal reflux disease) 02/19/2014  . Gout 02/19/2014   Past Medical History:  Diagnosis Date  . Compression fracture 05/24/2013   L1  . GERD (gastroesophageal reflux disease)   . Gout   . Hyperlipidemia   . Hypertension   . Knee fracture, left feb 2015   no surgery done  . Osteoporosis     Family History  Problem Relation Age of Onset  . Heart disease Mother   . Cancer Father        smoker  . Cancer Brother  lung and colon  . Colon cancer Brother   . Hip fracture Brother   . Dementia Brother 28  . COPD Brother   . Parkinson's disease Brother     Past Surgical History:  Procedure Laterality Date  . ABDOMINAL HYSTERECTOMY  1992   complete  . CHOLECYSTECTOMY N/A 06/28/2014   Procedure: LAPAROSCOPIC CHOLECYSTECTOMY ;  Surgeon: Fanny Skates, MD;  Location: [facility];  Service: General;  Laterality: N/A;  . EYE SURGERY Bilateral 2013   lens for cataracts  . LAMINECTOMY  1987   L5-S1  . TONSILLECTOMY  1961   Social History   Occupational History  . Occupation: Retired  Tobacco Use  . Smoking status: Former Smoker    Types: Cigarettes    Last attempt to quit: 02/24/1979    Years since  quitting: 39.2  . Smokeless tobacco: Never Used  . Tobacco comment: smoked 2-3 cig a day when smoking, quit long ago  Substance and Sexual Activity  . Alcohol use: No    Alcohol/week: 0.0 standard drinks  . Drug use: No  . Sexual activity: Never

## 2018-07-18 ENCOUNTER — Ambulatory Visit: Payer: Medicare Other | Admitting: Cardiology

## 2018-07-19 ENCOUNTER — Ambulatory Visit: Payer: Medicare Other | Admitting: Pediatrics

## 2018-07-19 ENCOUNTER — Ambulatory Visit (INDEPENDENT_AMBULATORY_CARE_PROVIDER_SITE_OTHER): Payer: Medicare Other | Admitting: Family Medicine

## 2018-07-19 ENCOUNTER — Encounter: Payer: Self-pay | Admitting: Family Medicine

## 2018-07-19 VITALS — BP 182/99 | HR 78 | Temp 97.0°F | Ht 62.25 in | Wt 183.0 lb

## 2018-07-19 DIAGNOSIS — M810 Age-related osteoporosis without current pathological fracture: Secondary | ICD-10-CM

## 2018-07-19 DIAGNOSIS — I10 Essential (primary) hypertension: Secondary | ICD-10-CM

## 2018-07-19 DIAGNOSIS — M1A9XX0 Chronic gout, unspecified, without tophus (tophi): Secondary | ICD-10-CM

## 2018-07-19 DIAGNOSIS — K219 Gastro-esophageal reflux disease without esophagitis: Secondary | ICD-10-CM

## 2018-07-19 DIAGNOSIS — R609 Edema, unspecified: Secondary | ICD-10-CM | POA: Diagnosis not present

## 2018-07-19 DIAGNOSIS — E782 Mixed hyperlipidemia: Secondary | ICD-10-CM

## 2018-07-19 DIAGNOSIS — E669 Obesity, unspecified: Secondary | ICD-10-CM

## 2018-07-19 MED ORDER — ALENDRONATE SODIUM 70 MG PO TABS: ORAL_TABLET | ORAL | 1 refills | Status: DC

## 2018-07-19 MED ORDER — FUROSEMIDE 20 MG PO TABS: 20.0000 mg | ORAL_TABLET | Freq: Every day | ORAL | 3 refills | Status: DC | PRN

## 2018-07-19 MED ORDER — BENZONATATE 100 MG PO CAPS: 100.0000 mg | ORAL_CAPSULE | Freq: Two times a day (BID) | ORAL | 0 refills | Status: DC | PRN

## 2018-07-19 MED ORDER — ALLOPURINOL 300 MG PO TABS: 300.0000 mg | ORAL_TABLET | Freq: Every day | ORAL | 3 refills | Status: DC

## 2018-07-19 NOTE — Progress Notes (Signed)
BP (!) 182/99   Pulse 78   Temp (!) 97 F (36.1 C) (Oral)   Ht 5' 2.25" (1.581 m)   Wt 183 lb (83 kg)   BMI 33.20 kg/m    Subjective:    Patient ID: Caitlyn Boone, female    DOB: [birth year], 78 y.o.   MRN: [id number]  HPI: Caitlyn Boone is a 78 y.o. female presenting on 07/19/2018 for Hypertension; Hyperlipidemia; Establ[facility] pt); Cough (x 2-3 weeks); and Foot Swelling (Bilateral. x 4 months on and off )   HPI Hypertension Patient is currently on amlodipine and lisinopril, and their blood pressure today is 182/99 but patient says is running better at home and she will keep a close eye on it over the next week at home and send [place] with some numbers.. Patient denies any lightheadedness or dizziness. Patient denies headaches, blurred vision, chest pains, shortness of breath, or weakness. Denies any side effects from medication and is content with current medication.   Hyperlipidemia Patient is coming in for recheck of his hyperlipidemia. The patient is currently taking pravastatin. They deny any issues with myalgias or history of liver damage from it. They deny any focal numbness or weakness or chest pain.   GERD Patient is currently on famotidine.  She denies any major symptoms or abdominal pain or belching or burping. She denies any blood in her stool or lightheadedness or dizziness.   Osteoporosis recheck Patient is coming in for osteoporosis recheck.  She had a bone density scan a year and a half ago and is not quite due for it.  She has been taking the Fosamax and feels like she is doing very well on it and denies any major stomach issues would like to continue it.  She has not had any major fractures in the past years as well.  Patient needs a refill for gout medication.  Patient does complain of swelling in bilateral lower extremities but worse on the left than the right.  She has had more issues since she fractured her patella on that left leg that swells up some.  She  was using Lasix but did not necessarily like using it consistently.  She will consider compression stockings  Patient has had a cough over the past few weeks and she did have a cold initially but mostly over that now there is a residual cough that is left and she would like something to help with cough.  Relevant past medical, surgical, family and social history reviewed and updated as indicated. Interim medical history since our last visit reviewed. Allergies and medications reviewed and updated.  Review of Systems  Constitutional: Negative for chills and fever.  HENT: Negative for congestion, ear discharge and ear pain.   Eyes: Negative for redness and visual disturbance.  Respiratory: Positive for cough. Negative for chest tightness and shortness of breath.   Cardiovascular: Negative for chest pain and leg swelling.  Genitourinary: Negative for difficulty urinating and dysuria.  Musculoskeletal: Negative for back pain and gait problem.  Skin: Negative for rash.  Neurological: Negative for dizziness, weakness, light-headedness, numbness and headaches.  Psychiatric/Behavioral: Negative for agitation and behavioral problems.  All other systems reviewed and are negative.   Per HPI unless specifically indicated above   Allergies as of 07/19/2018   No Known Allergies     Medication List       Accurate as of July 19, 2018  1:19 PM. Always use your most recent med list.  albuterol 108 (90 Base) MCG/ACT inhaler Commonly known as:  PROVENTIL HFA;VENTOLIN HFA Inhale 2 puffs into the lungs every 6 (six) hours as needed for wheezing or shortness of breath.   alendronate 70 MG tablet Commonly known as:  FOSAMAX TAKE ONE TABLET BY MOUTH ONCE A WEEK TAKE WITH A FULL GLASS OF WATER ON AN EMPTY STOMACH   allopurinol 300 MG tablet Commonly known as:  ZYLOPRIM Take 1 tablet (300 mg total) by mouth daily.   amLODipine 5 MG tablet Commonly known as:  NORVASC Take 1 tablet (5 mg  total) by mouth daily.   aspirin EC 81 MG tablet Take 81 mg by mouth daily.   benzonatate 100 MG capsule Commonly known as:  TESSALON Take 1 capsule (100 mg total) by mouth 2 (two) times daily as needed for cough.   famotidine 20 MG tablet Commonly known as:  PEPCID Take 1 tablet (20 mg total) by mouth 2 (two) times daily.   Fish Oil 1000 MG Caps Take 1 capsule by mouth.   fluticasone 50 MCG/ACT nasal spray Commonly known as:  FLONASE Place 2 sprays into both nostrils as needed.   furosemide 20 MG tablet Commonly known as:  LASIX Take 1 tablet (20 mg total) by mouth daily as needed (for swelling present in the morning).   lisinopril 40 MG tablet Commonly known as:  PRINIVIL,ZESTRIL Take 1 tablet (40 mg total) by mouth daily.   multivitamin with minerals Tabs tablet Take 1 tablet by mouth daily.   pravastatin 80 MG tablet Commonly known as:  PRAVACHOL TAKE ONE TABLET BY MOUTH ONCE DAILY IN THE EVENING   STOOL SOFTENER PO Take by mouth. Take 1-2 tablets prn   Vitamin D 50 MCG (2000 UT) Caps Take 1 capsule by mouth daily.          Objective:    BP (!) 182/99   Pulse 78   Temp (!) 97 F (36.1 C) (Oral)   Ht 5' 2.25" (1.581 m)   Wt 183 lb (83 kg)   BMI 33.20 kg/m   Wt Readings from Last 3 Encounters:  07/19/18 183 lb (83 kg)  04/27/18 186 lb 9.6 oz (84.6 kg)  03/08/18 182 lb 9.6 oz (82.8 kg)    Physical Exam Vitals signs and nursing note reviewed.  Constitutional:      General: She is not in acute distress.    Appearance: She is well-developed. She is not diaphoretic.  Eyes:     Conjunctiva/sclera: Conjunctivae normal.  Cardiovascular:     Rate and Rhythm: Normal rate and regular rhythm.     Heart sounds: Normal heart sounds. No murmur.  Pulmonary:     Effort: Pulmonary effort is normal. No respiratory distress.     Breath sounds: Normal breath sounds. No wheezing.  Musculoskeletal: Normal range of motion.        General: Swelling (1+ pitting edema  in left lower extremity, mild or minimal swelling in right ankle) present. No tenderness.  Skin:    General: Skin is warm and dry.     Findings: No rash.  Neurological:     Mental Status: She is alert and oriented to person, place, and time.     Coordination: Coordination normal.  Psychiatric:        Behavior: Behavior normal.         Assessment & Plan:   Problem List Items Addressed This Visit      Cardiovascular and Mediastinum   Essential hypertension - Primary  Relevant Medications   furosemide (LASIX) 20 MG tablet   Other Relevant Orders   CMP14+EGFR     Digestive   GERD (gastroesophageal reflux disease)   Relevant Orders   CBC with Differential/Platelet     Musculoskeletal and Integument   Osteoporosis   Relevant Medications   alendronate (FOSAMAX) 70 MG tablet     Other   Hyperlipidemia   Relevant Medications   furosemide (LASIX) 20 MG tablet   Other Relevant Orders   Lipid panel   Gout   Relevant Medications   allopurinol (ZYLOPRIM) 300 MG tablet   Obesity (BMI 30.0-34.9)   Relevant Orders   Lipid panel    Other Visit Diagnoses    Swelling       Relevant Medications   furosemide (LASIX) 20 MG tablet    Will check blood work, continue Fosamax, continue Lasix as needed but sparingly, continue allopurinol.  Continue both of her blood pressure medications including amlodipine and lisinopril and give [place] back some blood pressure readings over the next week and we will see where it is running at home as well.  Follow up plan: Return in about 6 months (around 01/17/2019), or if symptoms worsen or fail to improve.  Counseling provided for all of the vaccine components Orders Placed This Encounter  Procedures  . CBC with Differential/Platelet  . CMP14+EGFR  . Lipid panel    Caryl Pina, MD [facility] 07/19/2018, 1:19 PM

## 2018-07-20 LAB — CBC WITH DIFFERENTIAL/PLATELET
BASOS: 1 %
Basophils Absolute: 0.1 10*3/uL (ref 0.0–0.2)
EOS (ABSOLUTE): 0.1 10*3/uL (ref 0.0–0.4)
Eos: 1 %
Hematocrit: 42.8 % (ref 34.0–46.6)
Hemoglobin: 14.6 g/dL (ref 11.1–15.9)
IMMATURE GRANULOCYTES: 0 %
Immature Grans (Abs): 0 10*3/uL (ref 0.0–0.1)
Lymphocytes Absolute: 2.3 10*3/uL (ref 0.7–3.1)
Lymphs: 36 %
MCH: 30 pg (ref 26.6–33.0)
MCHC: 34.1 g/dL (ref 31.5–35.7)
MCV: 88 fL (ref 79–97)
MONOS ABS: 0.5 10*3/uL (ref 0.1–0.9)
Monocytes: 8 %
Neutrophils Absolute: 3.4 10*3/uL (ref 1.4–7.0)
Neutrophils: 54 %
Platelets: 342 10*3/uL (ref 150–450)
RBC: 4.86 x10E6/uL (ref 3.77–5.28)
RDW: 14.4 % (ref 11.7–15.4)
WBC: 6.3 10*3/uL (ref 3.4–10.8)

## 2018-07-20 LAB — LIPID PANEL
CHOL/HDL RATIO: 3.5 ratio (ref 0.0–4.4)
Cholesterol, Total: 176 mg/dL (ref 100–199)
HDL: 51 mg/dL (ref >39)
LDL Calculated: 82 mg/dL (ref 0–99)
Triglycerides: 215 mg/dL — ABNORMAL HIGH (ref 0–149)
VLDL Cholesterol Cal: 43 mg/dL — ABNORMAL HIGH (ref 5–40)

## 2018-07-20 LAB — CMP14+EGFR
ALT: 22 IU/L (ref 0–32)
AST: 26 IU/L (ref 0–40)
Albumin/Globulin Ratio: 1.5 (ref 1.2–2.2)
Albumin: 4.4 g/dL (ref 3.7–4.7)
Alkaline Phosphatase: 66 IU/L (ref 39–117)
BUN/Creatinine Ratio: 11 — ABNORMAL LOW (ref 12–28)
BUN: 9 mg/dL (ref 8–27)
Bilirubin Total: 0.3 mg/dL (ref 0.0–1.2)
CALCIUM: 10.5 mg/dL — AB (ref 8.7–10.3)
CO2: 24 mmol/L (ref 20–29)
Chloride: 105 mmol/L (ref 96–106)
Creatinine, Ser: 0.82 mg/dL (ref 0.57–1.00)
GFR calc Af Amer: 79 mL/min/{1.73_m2} (ref >59)
GFR, EST NON AFRICAN AMERICAN: 69 mL/min/{1.73_m2} (ref >59)
Globulin, Total: 3 g/dL (ref 1.5–4.5)
Glucose: 90 mg/dL (ref 65–99)
Potassium: 3.9 mmol/L (ref 3.5–5.2)
Sodium: 144 mmol/L (ref 134–144)
Total Protein: 7.4 g/dL (ref 6.0–8.5)

## 2018-08-02 ENCOUNTER — Other Ambulatory Visit: Payer: Self-pay | Admitting: Pediatrics

## 2018-08-02 DIAGNOSIS — E785 Hyperlipidemia, unspecified: Secondary | ICD-10-CM

## 2018-08-07 ENCOUNTER — Other Ambulatory Visit: Payer: Self-pay | Admitting: Family Medicine

## 2018-08-07 DIAGNOSIS — Z1231 Encounter for screening mammogram for malignant neoplasm of breast: Secondary | ICD-10-CM

## 2018-08-25 ENCOUNTER — Other Ambulatory Visit: Payer: Self-pay | Admitting: Pediatrics

## 2018-08-25 DIAGNOSIS — I10 Essential (primary) hypertension: Secondary | ICD-10-CM

## 2018-09-11 ENCOUNTER — Ambulatory Visit: Payer: Medicare Other

## 2018-10-24 ENCOUNTER — Ambulatory Visit: Payer: Medicare Other

## 2018-11-30 ENCOUNTER — Other Ambulatory Visit: Payer: Self-pay

## 2018-11-30 ENCOUNTER — Ambulatory Visit
Admission: RE | Admit: 2018-11-30 | Discharge: 2018-11-30 | Disposition: A | Discharge status: Home / Self Care | Payer: Medicare Other | Source: Ambulatory Visit | Attending: Family Medicine | Admitting: Family Medicine

## 2018-11-30 DIAGNOSIS — Z1231 Encounter for screening mammogram for malignant neoplasm of breast: Secondary | ICD-10-CM | POA: Diagnosis not present

## 2018-12-01 ENCOUNTER — Other Ambulatory Visit: Payer: Self-pay | Admitting: Family Medicine

## 2018-12-01 DIAGNOSIS — R928 Other abnormal and inconclusive findings on diagnostic imaging of breast: Secondary | ICD-10-CM

## 2018-12-06 ENCOUNTER — Ambulatory Visit
Admission: RE | Admit: 2018-12-06 | Discharge: 2018-12-06 | Disposition: A | Discharge status: Home / Self Care | Payer: Medicare Other | Source: Ambulatory Visit | Attending: Family Medicine | Admitting: Family Medicine

## 2018-12-06 ENCOUNTER — Other Ambulatory Visit: Payer: Self-pay

## 2018-12-06 DIAGNOSIS — R928 Other abnormal and inconclusive findings on diagnostic imaging of breast: Secondary | ICD-10-CM

## 2018-12-06 DIAGNOSIS — N6002 Solitary cyst of left breast: Secondary | ICD-10-CM | POA: Diagnosis not present

## 2018-12-06 DIAGNOSIS — R921 Mammographic calcification found on diagnostic imaging of breast: Secondary | ICD-10-CM | POA: Diagnosis not present

## 2018-12-08 DIAGNOSIS — H40013 Open angle with borderline findings, low risk, bilateral: Secondary | ICD-10-CM | POA: Diagnosis not present

## 2018-12-08 DIAGNOSIS — Z961 Presence of intraocular lens: Secondary | ICD-10-CM | POA: Diagnosis not present

## 2018-12-08 DIAGNOSIS — H35033 Hypertensive retinopathy, bilateral: Secondary | ICD-10-CM | POA: Diagnosis not present

## 2018-12-08 DIAGNOSIS — H0102A Squamous blepharitis right eye, upper and lower eyelids: Secondary | ICD-10-CM | POA: Diagnosis not present

## 2018-12-08 LAB — HM DIABETES EYE EXAM

## 2018-12-11 ENCOUNTER — Other Ambulatory Visit: Payer: Self-pay

## 2018-12-11 ENCOUNTER — Ambulatory Visit (INDEPENDENT_AMBULATORY_CARE_PROVIDER_SITE_OTHER): Payer: Medicare Other | Admitting: Family Medicine

## 2018-12-11 ENCOUNTER — Encounter: Payer: Self-pay | Admitting: Family Medicine

## 2018-12-11 VITALS — BP 159/88 | HR 75 | Temp 97.5°F | Ht 62.25 in | Wt 182.2 lb

## 2018-12-11 DIAGNOSIS — S76311A Strain of muscle, fascia and tendon of the posterior muscle group at thigh level, right thigh, initial encounter: Secondary | ICD-10-CM

## 2018-12-11 MED ORDER — PREDNISONE 20 MG PO TABS: ORAL_TABLET | ORAL | 0 refills | Status: DC

## 2018-12-11 NOTE — Progress Notes (Signed)
BP (!) 159/88   Pulse 75   Temp (!) 97.5 F (36.4 C) (Oral)   Ht 5' 2.25" (1.581 m)   Wt 182 lb 3.2 oz (82.6 kg)   BMI 33.06 kg/m    Subjective:   Patient ID: Caitlyn Boone, female    DOB: 01-14-1941, 78 y.o.   MRN: [id number]  HPI: Caitlyn Boone is a 78 y.o. female presenting on 12/11/2018 for Hip Pain (right- patient states she has been having right hip pain since thur that is causing her to have trouble walking)   HPI Right hip/back of her leg pain Patient is coming in with complaints of pain in the back of her right upper leg in the hamstring.  She says that she went into an appointment on Thursday about 4 days ago.  She says it feels tight in the back of that leg and is painful when she is walking.  She says it does not hurt as much at rest but mainly when she is moving and walking.  Relevant past medical, surgical, family and social history reviewed and updated as indicated. Interim medical history since our last visit reviewed. Allergies and medications reviewed and updated.  Review of Systems  Constitutional: Negative for chills and fever.  Eyes: Negative for visual disturbance.  Respiratory: Negative for chest tightness and shortness of breath.   Cardiovascular: Negative for chest pain and leg swelling.  Musculoskeletal: Positive for arthralgias and myalgias. Negative for back pain and gait problem.  Skin: Negative for rash.  Neurological: Negative for light-headedness and headaches.  Psychiatric/Behavioral: Negative for agitation and behavioral problems.  All other systems reviewed and are negative.   Per HPI unless specifically indicated above   Allergies as of 12/11/2018   No Known Allergies     Medication List       Accurate as of December 11, 2018 11:59 PM. If you have any questions, ask your nurse or doctor.        STOP taking these medications   benzonatate 100 MG capsule Commonly known as: TESSALON Stopped by: Worthy Rancher, MD     TAKE these  medications   albuterol 108 (90 Base) MCG/ACT inhaler Commonly known as: VENTOLIN HFA Inhale 2 puffs into the lungs every 6 (six) hours as needed for wheezing or shortness of breath.   alendronate 70 MG tablet Commonly known as: FOSAMAX TAKE ONE TABLET BY MOUTH ONCE A WEEK TAKE WITH A FULL GLASS OF WATER ON AN EMPTY STOMACH   allopurinol 300 MG tablet Commonly known as: ZYLOPRIM Take 1 tablet (300 mg total) by mouth daily.   amLODipine 5 MG tablet Commonly known as: NORVASC Take 1 tablet (5 mg total) by mouth daily.   aspirin EC 81 MG tablet Take 81 mg by mouth daily.   famotidine 20 MG tablet Commonly known as: Pepcid Take 1 tablet (20 mg total) by mouth 2 (two) times daily.   Fish Oil 1000 MG Caps Take 1 capsule by mouth.   fluticasone 50 MCG/ACT nasal spray Commonly known as: FLONASE Place 2 sprays into both nostrils as needed.   furosemide 20 MG tablet Commonly known as: LASIX Take 1 tablet (20 mg total) by mouth daily as needed (for swelling present in the morning).   lisinopril 40 MG tablet Commonly known as: ZESTRIL Take 1 tablet by mouth once daily   multivitamin with minerals Tabs tablet Take 1 tablet by mouth daily.   pravastatin 80 MG tablet Commonly known as: PRAVACHOL  TAKE 1 TABLET BY MOUTH ONCE DAILY IN THE EVENING   predniSONE 20 MG tablet Commonly known as: DELTASONE 2 po at same time daily for 5 days Started by: Fransisca Kaufmann Bryttani Blew, MD   STOOL SOFTENER PO Take by mouth. Take 1-2 tablets prn   Vitamin D 50 MCG (2000 UT) Caps Take 1 capsule by mouth daily.        Objective:   BP (!) 159/88   Pulse 75   Temp (!) 97.5 F (36.4 C) (Oral)   Ht 5' 2.25" (1.581 m)   Wt 182 lb 3.2 oz (82.6 kg)   BMI 33.06 kg/m   Wt Readings from Last 3 Encounters:  12/11/18 182 lb 3.2 oz (82.6 kg)  07/19/18 183 lb (83 kg)  04/27/18 186 lb 9.6 oz (84.6 kg)    Physical Exam Vitals signs and nursing note reviewed.  Constitutional:      General: She is  not in acute distress.    Appearance: She is well-developed. She is not diaphoretic.  Eyes:     Conjunctiva/sclera: Conjunctivae normal.  Cardiovascular:     Rate and Rhythm: Normal rate and regular rhythm.     Heart sounds: Normal heart sounds. No murmur.  Pulmonary:     Effort: Pulmonary effort is normal. No respiratory distress.     Breath sounds: Normal breath sounds. No wheezing.  Musculoskeletal: Normal range of motion.        General: No tenderness.  Skin:    General: Skin is warm and dry.     Findings: No rash.  Neurological:     Mental Status: She is alert and oriented to person, place, and time.     Coordination: Coordination normal.  Psychiatric:        Behavior: Behavior normal.       Assessment & Plan:   Problem List Items Addressed This Visit    None    Visit Diagnoses    Right hamstring strain, initial encounter    -  Primary   Relevant Medications   predniSONE (DELTASONE) 20 MG tablet      Recommended stretching and a short course of prednisone and if not improved then will discuss physical therapy. Follow up plan: Return if symptoms worsen or fail to improve.  Counseling provided for all of the vaccine components No orders of the defined types were placed in this encounter.   Caryl Pina, MD [facility] 12/13/2018, 9:39 PM

## 2019-01-01 ENCOUNTER — Encounter: Payer: Self-pay | Admitting: Family Medicine

## 2019-01-01 MED ORDER — DICLOFENAC SODIUM 1 % TD GEL: 2.0000 g | Freq: Four times a day (QID) | TRANSDERMAL | 2 refills | Status: DC

## 2019-01-01 MED ORDER — PENNSAID 2 % TD SOLN: 1.0000 "application " | Freq: Four times a day (QID) | TRANSDERMAL | 3 refills | Status: DC | PRN

## 2019-01-01 NOTE — Addendum Note (Signed)
Addended by: Caryl Pina on: 01/01/2019 04:56 PM   Modules accepted: Orders

## 2019-01-02 ENCOUNTER — Telehealth: Payer: Self-pay | Admitting: *Deleted

## 2019-01-02 NOTE — Telephone Encounter (Signed)
Pennsaid 2% Solution is not covered by pt's insurance  Send something else or I can try for the PA

## 2019-01-03 NOTE — Telephone Encounter (Signed)
Left track for the prior authorization because I cannot think of any other topical anti-inflammatories that are on the market currently.

## 2019-01-03 NOTE — Telephone Encounter (Signed)
Prior Auth for Pennsaid 2%-In Process   PA Case ID: [id number]   (Key: [id number])  [employer] is reviewing your PA request. Typically an electronic response will be received within 72 hours. To check for an update later, open this request from your dashboard.  You may close this dialog and return to your dashboard to perform other tasks.

## 2019-01-04 MED ORDER — DICLOFENAC EPOLAMINE 1.3 % TD PTCH: 1.0000 | MEDICATED_PATCH | Freq: Two times a day (BID) | TRANSDERMAL | 3 refills | Status: DC

## 2019-01-04 NOTE — Telephone Encounter (Signed)
Please let the patient know that it looks like they will not cover the gels but they will cover the patches apparently, I have tried to send over the patches and see if those do help.  It is the same medicine that and the Voltaren gel but in a patch.

## 2019-01-04 NOTE — Telephone Encounter (Signed)
Prior Auth for Pennsaid 2%-DENIED  Alternatives Diclofenac epolamine which could still require Prior Auth  To Appeal you can call [phone]

## 2019-01-05 NOTE — Telephone Encounter (Signed)
Patient aware.

## 2019-01-08 ENCOUNTER — Telehealth: Payer: Self-pay | Admitting: *Deleted

## 2019-01-08 NOTE — Telephone Encounter (Signed)
Prior Auth for Diclofenac 1.3% patches-APPROVED  PA Case ID: [id number]  DICLOFENAC DIS 1.3% is approved through 06/07/2019. For further questions, call [phone].  [employer] Medicare Part D

## 2019-01-15 ENCOUNTER — Other Ambulatory Visit: Payer: Self-pay | Admitting: Family Medicine

## 2019-01-15 DIAGNOSIS — M810 Age-related osteoporosis without current pathological fracture: Secondary | ICD-10-CM

## 2019-01-18 ENCOUNTER — Ambulatory Visit: Payer: Medicare Other | Admitting: Family Medicine

## 2019-02-07 ENCOUNTER — Other Ambulatory Visit: Payer: Self-pay | Admitting: Family Medicine

## 2019-02-07 DIAGNOSIS — E785 Hyperlipidemia, unspecified: Secondary | ICD-10-CM

## 2019-02-13 ENCOUNTER — Other Ambulatory Visit: Payer: Self-pay

## 2019-02-14 ENCOUNTER — Ambulatory Visit (INDEPENDENT_AMBULATORY_CARE_PROVIDER_SITE_OTHER): Payer: Medicare Other | Admitting: Family Medicine

## 2019-02-14 ENCOUNTER — Encounter: Payer: Self-pay | Admitting: Family Medicine

## 2019-02-14 VITALS — BP 169/90 | HR 64 | Temp 98.9°F | Resp 16 | Ht 62.25 in | Wt 179.8 lb

## 2019-02-14 DIAGNOSIS — I10 Essential (primary) hypertension: Secondary | ICD-10-CM

## 2019-02-14 DIAGNOSIS — M810 Age-related osteoporosis without current pathological fracture: Secondary | ICD-10-CM | POA: Diagnosis not present

## 2019-02-14 DIAGNOSIS — K219 Gastro-esophageal reflux disease without esophagitis: Secondary | ICD-10-CM | POA: Diagnosis not present

## 2019-02-14 DIAGNOSIS — E785 Hyperlipidemia, unspecified: Secondary | ICD-10-CM

## 2019-02-14 DIAGNOSIS — M1A9XX0 Chronic gout, unspecified, without tophus (tophi): Secondary | ICD-10-CM | POA: Diagnosis not present

## 2019-02-14 DIAGNOSIS — R609 Edema, unspecified: Secondary | ICD-10-CM

## 2019-02-14 MED ORDER — ALENDRONATE SODIUM 70 MG PO TABS: ORAL_TABLET | ORAL | 0 refills | Status: DC

## 2019-02-14 MED ORDER — PRAVASTATIN SODIUM 80 MG PO TABS: 80.0000 mg | ORAL_TABLET | Freq: Every evening | ORAL | 3 refills | Status: DC

## 2019-02-14 MED ORDER — ALLOPURINOL 300 MG PO TABS: 300.0000 mg | ORAL_TABLET | Freq: Every day | ORAL | 3 refills | Status: DC

## 2019-02-14 MED ORDER — FAMOTIDINE 20 MG PO TABS: 20.0000 mg | ORAL_TABLET | Freq: Two times a day (BID) | ORAL | 3 refills | Status: DC

## 2019-02-14 MED ORDER — AMLODIPINE BESYLATE 5 MG PO TABS: 5.0000 mg | ORAL_TABLET | Freq: Every day | ORAL | 3 refills | Status: DC

## 2019-02-14 MED ORDER — LISINOPRIL 40 MG PO TABS: 40.0000 mg | ORAL_TABLET | Freq: Every day | ORAL | 3 refills | Status: DC

## 2019-02-14 MED ORDER — FUROSEMIDE 20 MG PO TABS: 20.0000 mg | ORAL_TABLET | Freq: Every day | ORAL | 3 refills | Status: DC | PRN

## 2019-02-14 NOTE — Progress Notes (Signed)
BP (!) 169/90   Pulse 64   Temp 98.9 F (37.2 C) (Temporal)   Resp 16   Ht 5' 2.25" (1.581 m)   Wt 179 lb 12.8 oz (81.6 kg)   SpO2 98%   BMI 32.62 kg/m    Subjective:   Patient ID: Caitlyn Boone, female    DOB: 10-23-1940, 78 y.o.   MRN: [id number]  HPI: Caitlyn Boone is a 78 y.o. female presenting on 02/14/2019 for Hypertension (6 month follow up) and Gastroesophageal Reflux   HPI Hypertension Patient is currently on amlodipine and lisinopril, and their blood pressure today is 169/90 and 158/89. Patient denies any lightheadedness or dizziness. Patient denies headaches, blurred vision, chest pains, shortness of breath, or weakness. Denies any side effects from medication and is content with current medication.   Hyperlipidemia Patient is coming in for recheck of his hyperlipidemia. The patient is currently taking pravastatin and fish oil. They deny any issues with myalgias or history of liver damage from it. They deny any focal numbness or weakness or chest pain.   GERD Patient is currently on famotidine.  She denies any major symptoms or abdominal pain or belching or burping. She denies any blood in her stool or lightheadedness or dizziness.   Osteoporosis management Patient is currently on Fosamax and she is coming in for recheck of osteoporosis management, she is due for a bone density scan but are the laboratory technician is not trying on a just yet and she will be soon and as soon as she is trained on that we can get her scheduled for bone density.  Relevant past medical, surgical, family and social history reviewed and updated as indicated. Interim medical history since our last visit reviewed. Allergies and medications reviewed and updated.  Review of Systems  Constitutional: Negative for chills and fever.  Eyes: Negative for visual disturbance.  Respiratory: Negative for chest tightness and shortness of breath.   Cardiovascular: Negative for chest pain and leg swelling.   Musculoskeletal: Positive for arthralgias (Patient has been having chronic hip joint problems but that has been improved.). Negative for back pain and gait problem.  Skin: Negative for rash.  Neurological: Negative for dizziness, light-headedness and headaches.  Psychiatric/Behavioral: Negative for agitation and behavioral problems.  All other systems reviewed and are negative.   Per HPI unless specifically indicated above   Allergies as of 02/14/2019   No Known Allergies     Medication List       Accurate as of February 14, 2019 10:22 AM. If you have any questions, ask your nurse or doctor.        STOP taking these medications   diclofenac 1.3 % Ptch Commonly known as: FLECTOR Stopped by: Fransisca Kaufmann Manvir Thorson, MD   Pennsaid 2 % Soln Generic drug: Diclofenac Sodium Stopped by: Fransisca Kaufmann Dejohn Ibarra, MD   predniSONE 20 MG tablet Commonly known as: DELTASONE Stopped by: Fransisca Kaufmann Ammie Warrick, MD     TAKE these medications   albuterol 108 (90 Base) MCG/ACT inhaler Commonly known as: VENTOLIN HFA Inhale 2 puffs into the lungs every 6 (six) hours as needed for wheezing or shortness of breath.   alendronate 70 MG tablet Commonly known as: FOSAMAX TAKE 1 TABLET BY MOUTH ONCE A WEEK TAKE WITH A FULL GLASS OF WATER ON AN EMPTY STOMACH   allopurinol 300 MG tablet Commonly known as: ZYLOPRIM Take 1 tablet (300 mg total) by mouth daily.   amLODipine 5 MG tablet Commonly known as:  NORVASC Take 1 tablet (5 mg total) by mouth daily.   aspirin EC 81 MG tablet Take 81 mg by mouth daily.   famotidine 20 MG tablet Commonly known as: Pepcid Take 1 tablet (20 mg total) by mouth 2 (two) times daily.   Fish Oil 1000 MG Caps Take 1 capsule by mouth.   fluticasone 50 MCG/ACT nasal spray Commonly known as: FLONASE Place 2 sprays into both nostrils as needed.   furosemide 20 MG tablet Commonly known as: LASIX Take 1 tablet (20 mg total) by mouth daily as needed (for swelling present  in the morning).   lisinopril 40 MG tablet Commonly known as: ZESTRIL Take 1 tablet (40 mg total) by mouth daily.   multivitamin with minerals Tabs tablet Take 1 tablet by mouth daily.   pravastatin 80 MG tablet Commonly known as: PRAVACHOL Take 1 tablet (80 mg total) by mouth every evening.   STOOL SOFTENER PO Take by mouth. Take 1-2 tablets prn   Vitamin D 50 MCG (2000 UT) Caps Take 1 capsule by mouth daily.        Objective:   BP (!) 169/90   Pulse 64   Temp 98.9 F (37.2 C) (Temporal)   Resp 16   Ht 5' 2.25" (1.581 m)   Wt 179 lb 12.8 oz (81.6 kg)   SpO2 98%   BMI 32.62 kg/m   Wt Readings from Last 3 Encounters:  02/14/19 179 lb 12.8 oz (81.6 kg)  12/11/18 182 lb 3.2 oz (82.6 kg)  07/19/18 183 lb (83 kg)    Physical Exam Vitals signs and nursing note reviewed.  Constitutional:      General: She is not in acute distress.    Appearance: She is well-developed. She is not diaphoretic.  Eyes:     Conjunctiva/sclera: Conjunctivae normal.  Cardiovascular:     Rate and Rhythm: Normal rate and regular rhythm.     Heart sounds: Normal heart sounds. No murmur.  Pulmonary:     Effort: Pulmonary effort is normal. No respiratory distress.     Breath sounds: Normal breath sounds. No wheezing.  Musculoskeletal: Normal range of motion.        General: Swelling (Trace edema) present. No tenderness.  Skin:    General: Skin is warm and dry.     Findings: No rash.  Neurological:     Mental Status: She is alert and oriented to person, place, and time.     Coordination: Coordination normal.  Psychiatric:        Behavior: Behavior normal.     Results for orders placed or performed in visit on 12/13/18  HM DIABETES EYE EXAM  Result Value Ref Range   HM Diabetic Eye Exam Retinopathy (A) No Retinopathy    Assessment & Plan:   Problem List Items Addressed This Visit      Cardiovascular and Mediastinum   Essential hypertension   Relevant Medications   amLODipine  (NORVASC) 5 MG tablet   lisinopril (ZESTRIL) 40 MG tablet   pravastatin (PRAVACHOL) 80 MG tablet   furosemide (LASIX) 20 MG tablet   Other Relevant Orders   CMP14+EGFR     Digestive   GERD (gastroesophageal reflux disease) - Primary   Relevant Medications   famotidine (PEPCID) 20 MG tablet   Other Relevant Orders   CBC with Differential/Platelet     Musculoskeletal and Integument   Osteoporosis   Relevant Medications   alendronate (FOSAMAX) 70 MG tablet     Other  Hyperlipidemia   Relevant Medications   amLODipine (NORVASC) 5 MG tablet   lisinopril (ZESTRIL) 40 MG tablet   pravastatin (PRAVACHOL) 80 MG tablet   furosemide (LASIX) 20 MG tablet   Other Relevant Orders   Lipid panel   Gout   Relevant Medications   allopurinol (ZYLOPRIM) 300 MG tablet    Other Visit Diagnoses    Swelling       Relevant Medications   furosemide (LASIX) 20 MG tablet    Follow-up in 6 months, continue current medication will check blood work today.  Call in a few weeks with some blood pressure numbers  Follow up plan: Return in about 6 months (around 08/14/2019), or if symptoms worsen or fail to improve, for Hypertension cholesterol.  Counseling provided for all of the vaccine components Orders Placed This Encounter  Procedures  . CBC with Differential/Platelet  . CMP14+EGFR  . Lipid panel    Caryl Pina, MD [facility] 02/14/2019, 10:22 AM

## 2019-02-15 LAB — CMP14+EGFR
ALT: 24 IU/L (ref 0–32)
AST: 29 IU/L (ref 0–40)
Albumin/Globulin Ratio: 1.7 (ref 1.2–2.2)
Albumin: 4.6 g/dL (ref 3.7–4.7)
Alkaline Phosphatase: 70 IU/L (ref 39–117)
BUN/Creatinine Ratio: 12 (ref 12–28)
BUN: 11 mg/dL (ref 8–27)
Bilirubin Total: 0.3 mg/dL (ref 0.0–1.2)
CO2: 25 mmol/L (ref 20–29)
Calcium: 10.8 mg/dL — ABNORMAL HIGH (ref 8.7–10.3)
Chloride: 104 mmol/L (ref 96–106)
Creatinine, Ser: 0.9 mg/dL (ref 0.57–1.00)
GFR calc Af Amer: 70 mL/min/{1.73_m2} (ref >59)
GFR calc non Af Amer: 61 mL/min/{1.73_m2} (ref >59)
Globulin, Total: 2.7 g/dL (ref 1.5–4.5)
Glucose: 94 mg/dL (ref 65–99)
Potassium: 4.2 mmol/L (ref 3.5–5.2)
Sodium: 143 mmol/L (ref 134–144)
Total Protein: 7.3 g/dL (ref 6.0–8.5)

## 2019-02-15 LAB — CBC WITH DIFFERENTIAL/PLATELET
Basophils Absolute: 0.1 10*3/uL (ref 0.0–0.2)
Basos: 1 %
EOS (ABSOLUTE): 0 10*3/uL (ref 0.0–0.4)
Eos: 1 %
Hematocrit: 44 % (ref 34.0–46.6)
Hemoglobin: 14.4 g/dL (ref 11.1–15.9)
Immature Grans (Abs): 0 10*3/uL (ref 0.0–0.1)
Immature Granulocytes: 0 %
Lymphocytes Absolute: 2 10*3/uL (ref 0.7–3.1)
Lymphs: 37 %
MCH: 29.2 pg (ref 26.6–33.0)
MCHC: 32.7 g/dL (ref 31.5–35.7)
MCV: 89 fL (ref 79–97)
Monocytes Absolute: 0.5 10*3/uL (ref 0.1–0.9)
Monocytes: 9 %
Neutrophils Absolute: 2.9 10*3/uL (ref 1.4–7.0)
Neutrophils: 52 %
Platelets: 322 10*3/uL (ref 150–450)
RBC: 4.93 x10E6/uL (ref 3.77–5.28)
RDW: 13.8 % (ref 11.7–15.4)
WBC: 5.4 10*3/uL (ref 3.4–10.8)

## 2019-02-15 LAB — LIPID PANEL
Chol/HDL Ratio: 3.7 ratio (ref 0.0–4.4)
Cholesterol, Total: 190 mg/dL (ref 100–199)
HDL: 52 mg/dL (ref >39)
LDL Chol Calc (NIH): 111 mg/dL — ABNORMAL HIGH (ref 0–99)
Triglycerides: 153 mg/dL — ABNORMAL HIGH (ref 0–149)
VLDL Cholesterol Cal: 27 mg/dL (ref 5–40)

## 2019-03-30 ENCOUNTER — Other Ambulatory Visit: Payer: Self-pay

## 2019-04-02 ENCOUNTER — Ambulatory Visit (INDEPENDENT_AMBULATORY_CARE_PROVIDER_SITE_OTHER): Payer: Medicare Other

## 2019-04-02 ENCOUNTER — Other Ambulatory Visit: Payer: Self-pay

## 2019-04-02 DIAGNOSIS — Z23 Encounter for immunization: Secondary | ICD-10-CM

## 2019-06-12 ENCOUNTER — Other Ambulatory Visit: Payer: Self-pay | Admitting: *Deleted

## 2019-06-13 DIAGNOSIS — H40013 Open angle with borderline findings, low risk, bilateral: Secondary | ICD-10-CM | POA: Diagnosis not present

## 2019-07-03 ENCOUNTER — Other Ambulatory Visit: Payer: Self-pay | Admitting: Family Medicine

## 2019-07-03 DIAGNOSIS — M810 Age-related osteoporosis without current pathological fracture: Secondary | ICD-10-CM

## 2019-08-14 ENCOUNTER — Other Ambulatory Visit: Payer: Self-pay

## 2019-08-15 ENCOUNTER — Ambulatory Visit (INDEPENDENT_AMBULATORY_CARE_PROVIDER_SITE_OTHER): Payer: Medicare Other | Admitting: Family Medicine

## 2019-08-15 ENCOUNTER — Encounter: Payer: Self-pay | Admitting: Family Medicine

## 2019-08-15 ENCOUNTER — Other Ambulatory Visit: Payer: Self-pay

## 2019-08-15 VITALS — BP 146/81 | HR 63 | Temp 99.1°F | Ht 64.0 in | Wt 185.0 lb

## 2019-08-15 DIAGNOSIS — K219 Gastro-esophageal reflux disease without esophagitis: Secondary | ICD-10-CM

## 2019-08-15 DIAGNOSIS — R42 Dizziness and giddiness: Secondary | ICD-10-CM | POA: Diagnosis not present

## 2019-08-15 DIAGNOSIS — I10 Essential (primary) hypertension: Secondary | ICD-10-CM

## 2019-08-15 DIAGNOSIS — E782 Mixed hyperlipidemia: Secondary | ICD-10-CM | POA: Diagnosis not present

## 2019-08-15 DIAGNOSIS — M1A9XX0 Chronic gout, unspecified, without tophus (tophi): Secondary | ICD-10-CM

## 2019-08-15 DIAGNOSIS — M810 Age-related osteoporosis without current pathological fracture: Secondary | ICD-10-CM | POA: Diagnosis not present

## 2019-08-15 MED ORDER — OMEPRAZOLE 20 MG PO CPDR: 20.0000 mg | DELAYED_RELEASE_CAPSULE | Freq: Every day | ORAL | 3 refills | Status: DC

## 2019-08-15 MED ORDER — MECLIZINE HCL 25 MG PO TABS: 25.0000 mg | ORAL_TABLET | Freq: Three times a day (TID) | ORAL | 3 refills | Status: DC | PRN

## 2019-08-15 MED ORDER — ALENDRONATE SODIUM 70 MG PO TABS: ORAL_TABLET | ORAL | 3 refills | Status: DC

## 2019-08-15 MED ORDER — ALLOPURINOL 300 MG PO TABS: 300.0000 mg | ORAL_TABLET | Freq: Every day | ORAL | 3 refills | Status: DC

## 2019-08-15 NOTE — Progress Notes (Signed)
BP (!) 167/80   Pulse (!) 130   Temp 99.1 F (37.3 C)   Ht 5' 4"  (1.626 m)   Wt 185 lb (83.9 kg)   SpO2 97%   BMI 31.76 kg/m    Subjective:   Patient ID: Caitlyn Boone, female    DOB: 03-07-1941, 79 y.o.   MRN: [id number]  HPI: Caitlyn Boone is a 79 y.o. female presenting on 08/15/2019 for Medical Management of Chronic Issues, Hyperlipidemia, Hypertension, and Eat fullness   HPI Hypertension Patient is currently on amlodipine and lisinopril, and their blood pressure today is 167/80, repeat 143/74. Patient denies any lightheadedness or dizziness. Patient denies headaches, blurred vision, chest pains, shortness of breath, or weakness. Denies any side effects from medication and is content with current medication.   GERD Patient is currently on Pepcid, she feels like she is having a lot of acid problems still, will try omeprazole.  She denies any major symptoms or abdominal pain or belching or burping. She denies any blood in her stool or lightheadedness or dizziness.   Hyperlipidemia Patient is coming in for recheck of his hyperlipidemia. The patient is currently taking fish oil and pravastatin. They deny any issues with myalgias or history of liver damage from it. They deny any focal numbness or weakness or chest pain.   Gout Last attack: Unknown Attacks this year: None Medication: Allopurinol Location of attacks: Feet, sometimes left sometimes right  Relevant past medical, surgical, family and social history reviewed and updated as indicated. Interim medical history since our last visit reviewed. Allergies and medications reviewed and updated.  Review of Systems  Constitutional: Negative for chills and fever.  HENT: Negative for congestion, ear discharge and ear pain.   Eyes: Negative for redness and visual disturbance.  Respiratory: Negative for chest tightness and shortness of breath.   Cardiovascular: Negative for chest pain and leg swelling.  Genitourinary: Negative for  difficulty urinating and dysuria.  Musculoskeletal: Negative for back pain and gait problem.  Skin: Negative for rash.  Neurological: Positive for dizziness. Negative for light-headedness and headaches.  Psychiatric/Behavioral: Negative for agitation and behavioral problems.  All other systems reviewed and are negative.  Per HPI unless specifically indicated above   Allergies as of 08/15/2019   No Known Allergies     Medication List       Accurate as of August 15, 2019  9:39 AM. If you have any questions, ask your nurse or doctor.        STOP taking these medications   albuterol 108 (90 Base) MCG/ACT inhaler Commonly known as: VENTOLIN HFA Stopped by: Fransisca Kaufmann Skyelyn Scruggs, MD     TAKE these medications   alendronate 70 MG tablet Commonly known as: FOSAMAX TAKE 1 TABLET BY MOUTH ONCE A WEEK WITH A FULL GLASS OF WATER ON AN EMPTY STOMACH   allopurinol 300 MG tablet Commonly known as: ZYLOPRIM Take 1 tablet (300 mg total) by mouth daily.   amLODipine 5 MG tablet Commonly known as: NORVASC Take 1 tablet (5 mg total) by mouth daily.   aspirin EC 81 MG tablet Take 81 mg by mouth daily.   famotidine 20 MG tablet Commonly known as: Pepcid Take 1 tablet (20 mg total) by mouth 2 (two) times daily.   Fish Oil 1000 MG Caps Take 1 capsule by mouth.   fluticasone 50 MCG/ACT nasal spray Commonly known as: FLONASE Place 2 sprays into both nostrils as needed.   furosemide 20 MG tablet Commonly known  as: LASIX Take 1 tablet (20 mg total) by mouth daily as needed (for swelling present in the morning).   lisinopril 40 MG tablet Commonly known as: ZESTRIL Take 1 tablet (40 mg total) by mouth daily.   multivitamin with minerals Tabs tablet Take 1 tablet by mouth daily.   pravastatin 80 MG tablet Commonly known as: PRAVACHOL Take 1 tablet (80 mg total) by mouth every evening.   STOOL SOFTENER PO Take by mouth. Take 1-2 tablets prn   Vitamin D 50 MCG (2000 UT) Caps Take 1  capsule by mouth daily.        Objective:   BP (!) 167/80   Pulse (!) 130   Temp 99.1 F (37.3 C)   Ht 5' 4"  (1.626 m)   Wt 185 lb (83.9 kg)   SpO2 97%   BMI 31.76 kg/m   Wt Readings from Last 3 Encounters:  08/15/19 185 lb (83.9 kg)  02/14/19 179 lb 12.8 oz (81.6 kg)  12/11/18 182 lb 3.2 oz (82.6 kg)    Physical Exam Vitals and nursing note reviewed.  Constitutional:      General: She is not in acute distress.    Appearance: She is well-developed. She is not diaphoretic.  Eyes:     Conjunctiva/sclera: Conjunctivae normal.  Cardiovascular:     Rate and Rhythm: Normal rate and regular rhythm.     Heart sounds: Normal heart sounds. No murmur.  Pulmonary:     Effort: Pulmonary effort is normal. No respiratory distress.     Breath sounds: Normal breath sounds. No wheezing.  Musculoskeletal:        General: No tenderness. Normal range of motion.  Skin:    General: Skin is warm and dry.     Findings: No rash.  Neurological:     Mental Status: She is alert and oriented to person, place, and time.     Coordination: Coordination normal.  Psychiatric:        Behavior: Behavior normal.       Assessment & Plan:   Problem List Items Addressed This Visit      Cardiovascular and Mediastinum   Essential hypertension   Relevant Orders   CMP14+EGFR     Digestive   GERD (gastroesophageal reflux disease)   Relevant Medications   omeprazole (PRILOSEC) 20 MG capsule   meclizine (ANTIVERT) 25 MG tablet   Other Relevant Orders   CBC with Differential/Platelet     Musculoskeletal and Integument   Osteoporosis   Relevant Medications   alendronate (FOSAMAX) 70 MG tablet     Other   Hyperlipidemia - Primary   Relevant Orders   Lipid panel   Gout   Relevant Medications   allopurinol (ZYLOPRIM) 300 MG tablet   Other Relevant Orders   Uric acid    Other Visit Diagnoses    Vertigo       Relevant Medications   meclizine (ANTIVERT) 25 MG tablet    We will start  omeprazole, she can hold onto the Pepcid and may use it as needed in the future but for now start the omeprazole instead of it and see if that helps more with her acid reflux.  Continue other current medication.  We will check blood work today.  Her blood pressure and heart rate on recheck were within normal limits, think she just got worked up from walking in because of deconditioning.  Follow up plan: Return in about 6 months (around 02/15/2020), or if symptoms worsen or fail  to improve, for Hyperlipidemia and hypertension.  Counseling provided for all of the vaccine components No orders of the defined types were placed in this encounter.   Caryl Pina, MD [facility] 08/15/2019, 9:39 AM

## 2019-08-16 LAB — CMP14+EGFR
ALT: 23 IU/L (ref 0–32)
AST: 29 IU/L (ref 0–40)
Albumin/Globulin Ratio: 1.4 (ref 1.2–2.2)
Albumin: 4.3 g/dL (ref 3.7–4.7)
Alkaline Phosphatase: 75 IU/L (ref 39–117)
BUN/Creatinine Ratio: 15 (ref 12–28)
BUN: 13 mg/dL (ref 8–27)
Bilirubin Total: 0.4 mg/dL (ref 0.0–1.2)
CO2: 25 mmol/L (ref 20–29)
Calcium: 10.9 mg/dL — ABNORMAL HIGH (ref 8.7–10.3)
Chloride: 105 mmol/L (ref 96–106)
Creatinine, Ser: 0.86 mg/dL (ref 0.57–1.00)
GFR calc Af Amer: 74 mL/min/{1.73_m2} (ref >59)
GFR calc non Af Amer: 64 mL/min/{1.73_m2} (ref >59)
Globulin, Total: 3.1 g/dL (ref 1.5–4.5)
Glucose: 89 mg/dL (ref 65–99)
Potassium: 4.3 mmol/L (ref 3.5–5.2)
Sodium: 145 mmol/L — ABNORMAL HIGH (ref 134–144)
Total Protein: 7.4 g/dL (ref 6.0–8.5)

## 2019-08-16 LAB — CBC WITH DIFFERENTIAL/PLATELET
Basophils Absolute: 0.1 10*3/uL (ref 0.0–0.2)
Basos: 1 %
EOS (ABSOLUTE): 0.1 10*3/uL (ref 0.0–0.4)
Eos: 1 %
Hematocrit: 47.4 % — ABNORMAL HIGH (ref 34.0–46.6)
Hemoglobin: 15.6 g/dL (ref 11.1–15.9)
Immature Grans (Abs): 0 10*3/uL (ref 0.0–0.1)
Immature Granulocytes: 0 %
Lymphocytes Absolute: 1.7 10*3/uL (ref 0.7–3.1)
Lymphs: 28 %
MCH: 30.1 pg (ref 26.6–33.0)
MCHC: 32.9 g/dL (ref 31.5–35.7)
MCV: 91 fL (ref 79–97)
Monocytes Absolute: 0.6 10*3/uL (ref 0.1–0.9)
Monocytes: 10 %
Neutrophils Absolute: 3.4 10*3/uL (ref 1.4–7.0)
Neutrophils: 60 %
Platelets: 305 10*3/uL (ref 150–450)
RBC: 5.19 x10E6/uL (ref 3.77–5.28)
RDW: 14 % (ref 11.7–15.4)
WBC: 5.8 10*3/uL (ref 3.4–10.8)

## 2019-08-16 LAB — LIPID PANEL
Chol/HDL Ratio: 3.1 ratio (ref 0.0–4.4)
Cholesterol, Total: 166 mg/dL (ref 100–199)
HDL: 53 mg/dL (ref >39)
LDL Chol Calc (NIH): 84 mg/dL (ref 0–99)
Triglycerides: 171 mg/dL — ABNORMAL HIGH (ref 0–149)
VLDL Cholesterol Cal: 29 mg/dL (ref 5–40)

## 2019-08-16 LAB — URIC ACID: Uric Acid: 3.8 mg/dL (ref 3.1–7.9)

## 2019-09-03 ENCOUNTER — Ambulatory Visit (INDEPENDENT_AMBULATORY_CARE_PROVIDER_SITE_OTHER): Payer: Medicare Other

## 2019-09-03 DIAGNOSIS — Z0000 Encounter for general adult medical examination without abnormal findings: Secondary | ICD-10-CM

## 2019-09-03 NOTE — Progress Notes (Signed)
MEDICARE ANNUAL WELLNESS VISIT  09/03/2019  Telephone Visit Disclaimer This Medicare AWV was conducted by telephone due to national recommendations for restrictions regarding the COVID-19 Pandemic (e.g. social distancing).  I verified, using two identifiers, that I am speaking with Caitlyn Boone or their authorized healthcare agent. I discussed the limitations, risks, security, and privacy concerns of performing an evaluation and management service by telephone and the potential availability of an in-person appointment in the future. The patient expressed understanding and agreed to proceed.   Subjective:  Caitlyn Boone is a 79 y.o. female patient of Dettinger, Caitlyn Kaufmann, MD who had a Medicare Annual Wellness Visit today via telephone. Omni is Retired from [employer] and lives with her spouse. she has 4 step children. she reports that she is socially active and does interact with friends/family regularly. she is moderately physically active and enjoys word search puzzles and jig saw puzzles.  Patient Care Team: Dettinger, Caitlyn Kaufmann, MD as PCP - General (Family Medicine) Konrad Felix, MD as Referring Physician (Ophthalmology) Milus Banister, MD as Attending Physician (Gastroenterology)  Advanced Directives 09/03/2019 12/28/2017 06/26/2014 05/21/2014  Does Patient Have a Medical Advance Directive? Yes Yes Yes No  Type of Industrial/product designer of [place];Living will -  Does patient want to make changes to medical advance directive? - No - Patient declined No - Patient declined -  Copy of [place] in Chart? No - copy requested Yes Yes -  Would patient like information on creating a medical advance directive? - No - Patient declined - [facility] Utilization Over the Past 12 Months: # of hospitalizations or ER visits: 0 # of surgeries: 0  Review of Systems    Patient reports that her overall health  is unchanged compared to last year.  History obtained from chart review  Patient Reported Readings (BP, Pulse, CBG, Weight, etc) none  Pain Assessment Pain : No/denies pain     Current Medications & Allergies (verified) Allergies as of 09/03/2019   No Known Allergies     Medication List       Accurate as of September 03, 2019  8:59 AM. If you have any questions, ask your nurse or doctor.        alendronate 70 MG tablet Commonly known as: FOSAMAX TAKE 1 TABLET BY MOUTH ONCE A WEEK WITH A FULL GLASS OF WATER ON AN EMPTY STOMACH   allopurinol 300 MG tablet Commonly known as: ZYLOPRIM Take 1 tablet (300 mg total) by mouth daily.   amLODipine 5 MG tablet Commonly known as: NORVASC Take 1 tablet (5 mg total) by mouth daily.   aspirin EC 81 MG tablet Take 81 mg by mouth daily.   famotidine 20 MG tablet Commonly known as: Pepcid Take 1 tablet (20 mg total) by mouth 2 (two) times daily.   Fish Oil 1000 MG Caps Take 1 capsule by mouth.   fluticasone 50 MCG/ACT nasal spray Commonly known as: FLONASE Place 2 sprays into both nostrils as needed.   furosemide 20 MG tablet Commonly known as: LASIX Take 1 tablet (20 mg total) by mouth daily as needed (for swelling present in the morning).   lisinopril 40 MG tablet Commonly known as: ZESTRIL Take 1 tablet (40 mg total) by mouth daily.   meclizine 25 MG tablet Commonly known as: ANTIVERT Take 1 tablet (25 mg total) by mouth 3 (three) times daily  as needed for dizziness.   multivitamin with minerals Tabs tablet Take 1 tablet by mouth daily.   omeprazole 20 MG capsule Commonly known as: PRILOSEC Take 1 capsule (20 mg total) by mouth daily.   pravastatin 80 MG tablet Commonly known as: PRAVACHOL Take 1 tablet (80 mg total) by mouth every evening.   STOOL SOFTENER PO Take by mouth. Take 1-2 tablets prn   Vitamin D 50 MCG (2000 UT) Caps Take 1 capsule by mouth daily.       History (reviewed): Past Medical  History:  Diagnosis Date  . Compression fracture 05/24/2013   L1  . GERD (gastroesophageal reflux disease)   . Gout   . Hyperlipidemia   . Hypertension   . Knee fracture, left feb 2015   no surgery done  . Osteoporosis    Past Surgical History:  Procedure Laterality Date  . ABDOMINAL HYSTERECTOMY  1992   complete  . CHOLECYSTECTOMY N/A 06/28/2014   Procedure: LAPAROSCOPIC CHOLECYSTECTOMY ;  Surgeon: Fanny Skates, MD;  Location: [facility];  Service: General;  Laterality: N/A;  . EYE SURGERY Bilateral 2013   lens for cataracts  . LAMINECTOMY  1987   L5-S1  . TONSILLECTOMY  1961   Family History  Problem Relation Age of Onset  . Heart disease Mother   . Cancer Father        smoker  . Cancer Brother        lung and colon  . Colon cancer Brother   . Hip fracture Brother   . Dementia Brother 34  . COPD Brother   . Parkinson's disease Brother    Social History   Socioeconomic History  . Marital status: Married    Spouse name: Not on file  . Number of children: 0  . Years of education: 28  . Highest education level: 12th grade  Occupational History  . Occupation: Retired  Tobacco Use  . Smoking status: Former Smoker    Types: Cigarettes    Quit date: 02/24/1979    Years since quitting: 40.5  . Smokeless tobacco: Never Used  . Tobacco comment: smoked 2-3 cig a day when smoking, quit long ago  Substance and Sexual Activity  . Alcohol use: No    Alcohol/week: 0.0 standard drinks  . Drug use: No  . Sexual activity: Not Currently  Other Topics Concern  . Not on file  Social History Narrative  . Not on file   Social Determinants of Health   Financial Resource Strain:   . Difficulty of Paying Living Expenses:   Food Insecurity:   . Worried About Charity fundraiser in the Last Year:   . Arboriculturist in the Last Year:   Transportation Needs:   . Film/video editor (Medical):   Marland Kitchen Lack of Transportation (Non-Medical):   Physical Activity:   . Days of  Exercise per Week:   . Minutes of Exercise per Session:   Stress:   . Feeling of Stress :   Social Connections:   . Frequency of Communication with Friends and Family:   . Frequency of Social Gatherings with Friends and Family:   . Attends Religious Services:   . Active Member of Clubs or Organizations:   . Attends Archivist Meetings:   Marland Kitchen Marital Status:     Activities of Daily Living In your present state of health, do you have any difficulty performing the following activities: 09/03/2019  Hearing? N  Vision? Y  Comment  Wears reading glasses  Difficulty concentrating or making decisions? N  Walking or climbing stairs? N  Dressing or bathing? N  Doing errands, shopping? N  Preparing Food and eating ? N  Using the Toilet? N  In the past six months, have you accidently leaked urine? N  Do you have problems with loss of bowel control? N  Managing your Medications? N  Managing your Finances? N  Housekeeping or managing your Housekeeping? N  Some recent data might be hidden   Patient does not have any stairs in their home but patient does not have any difficulty  Patient Education/ Literacy How often do you need to have someone help you when you read instructions, pamphlets, or other written materials from your doctor or pharmacy?: 1 - Never What is the last grade level you completed in school?: High School Graduate  Exercise Current Exercise Habits: The patient does not participate in regular exercise at present, Exercise limited by: orthopedic condition(s)  Diet Patient reports consuming 3 meals a day and 2 snack(s) a day Patient reports that her primary diet is: Regular Patient reports that she does have regular access to food.   Depression Screen PHQ 2/9 Scores 08/15/2019 02/14/2019 07/19/2018 04/27/2018 03/08/2018 02/24/2018 01/16/2018  PHQ - 2 Score 0 0 0 0 0 0 0     Fall Risk Fall Risk  09/03/2019 08/15/2019 02/14/2019 07/19/2018 04/27/2018  Falls in the past  year? 0 0 0 0 1  Number falls in past yr: - - - - 1  Injury with Fall? - - - - 1  Risk for fall due to : - - - - -     Objective:  Madelynn Done Calleros seemed alert and oriented and she participated appropriately during our telephone visit.  Blood Pressure Weight BMI  BP Readings from Last 3 Encounters:  08/15/19 (!) 146/81  02/14/19 (!) 169/90  12/11/18 (!) 159/88   Wt Readings from Last 3 Encounters:  08/15/19 185 lb (83.9 kg)  02/14/19 179 lb 12.8 oz (81.6 kg)  12/11/18 182 lb 3.2 oz (82.6 kg)   BMI Readings from Last 1 Encounters:  08/15/19 31.76 kg/m    *Unable to obtain current vital signs, weight, and BMI due to telephone visit type  Hearing/Vision  . Melena did not seem to have difficulty with hearing/understanding during the telephone conversation . Reports that she has not had a formal eye exam by an eye care professional within the past year. Her and her husband have an appt July 2021 . Reports that she has not had a formal hearing evaluation within the past year *Unable to fully assess hearing and vision during telephone visit type  Cognitive Function: 6CIT Screen 09/03/2019  What Year? 0 points  What month? 0 points  What time? 0 points  Count back from 20 0 points  Months in reverse 0 points  Repeat phrase 0 points  Total Score 0   (Normal:0-7, Significant for Dysfunction: >8)  Normal Cognitive Function Screening: Yes Patient had no problem completing cognitive function test  Immunization & Health Maintenance Record Immunization History  Administered Date(s) Administered  . Fluad Quad(high Dose 65+) 04/02/2019  . Influenza, High Dose Seasonal PF 03/30/2016, 03/21/2017, 03/08/2018  . Influenza,inj,Quad PF,6+ Mos 04/02/2014, 05/07/2015  . Pneumococcal Conjugate-13 02/19/2014  . Pneumococcal Polysaccharide-23 03/30/2006  . Tdap 04/02/2019    Health Maintenance  Topic Date Due  . DEXA SCAN  02/14/2020 (Originally 12/11/2018)  . TETANUS/TDAP  04/01/2029  .  INFLUENZA VACCINE  Completed  . PNA vac Low Risk Adult  Completed   Pt has an appt in September for Dexascan along with her regular follow up    Assessment  This is a routine wellness examination for CORTLYN HEGEL.  Health Maintenance: Due or Overdue There are no preventive care reminders to display for this patient.  Caitlyn Boone does not need a referral for Community Assistance: Care Management:   no Social Work:    no Prescription Assistance:  no Nutrition/Diabetes Education:  no   Plan:  Personalized Goals Goals Addressed            This Visit's Progress   . DIET - EAT MORE FRUITS AND VEGETABLES        Personalized Health Maintenance & Screening Recommendations  Bone densitometry screening  Lung Cancer Screening Recommended: no (Low Dose CT Chest recommended if Age 65-80 years, 30 pack-year currently smoking OR have quit w/in past 15 years) Hepatitis C Screening recommended: not applicable HIV Screening recommended: no  Advanced Directives: Written information was not prepared per patient's request.  Referrals & Orders No orders of the defined types were placed in this encounter.   Follow-up Plan . Follow-up with Dettinger, Caitlyn Kaufmann, MD as planned . Pt already scheduled for a bone density test are her follow up appt in September    I have personally reviewed and noted the following in the patient's chart:   . Medical and social history . Use of alcohol, tobacco or illicit drugs  . Current medications and supplements . Functional ability and status . Nutritional status . Physical activity . Advanced directives . List of other physicians . Hospitalizations, surgeries, and ER visits in previous 12 months . Vitals . Screenings to include cognitive, depression, and falls . Referrals and appointments  In addition, I have reviewed and discussed with Caitlyn Boone certain preventive protocols, quality metrics, and best practice recommendations. A written  personalized care plan for preventive services as well as general preventive health recommendations is available and can be mailed to the patient at her request.      Rolena Infante LPN [id number]

## 2019-09-06 ENCOUNTER — Telehealth: Payer: Self-pay | Admitting: Family Medicine

## 2019-09-06 NOTE — Chronic Care Management (AMB) (Signed)
  Chronic Care Management   Note  11/07/2295 Name: Caitlyn Boone MRN: [id number] DOB: [birth year]  Caitlyn Boone is a 79 y.o. year old female who is a primary care patient of Dettinger, Fransisca Kaufmann, MD. I reached out to [name] by phone today in response to a referral sent by [name]'s health plan.     [name] was given information about Chronic Care Management services today including:  1. CCM service includes personalized support from designated clinical staff supervised by her physician, including individualized plan of care and coordination with other care providers 2. 24/7 contact phone numbers for assistance for urgent and routine care needs. 3. Service will only be billed when office clinical staff spend 20 minutes or more in a month to coordinate care. 4. Only one practitioner may furnish and bill the service in a calendar month. 5. The patient may stop CCM services at any time (effective at the end of the month) by phone call to the office staff. 6. The patient will be responsible for cost sharing (co-pay) of up to 20% of the service fee (after annual deductible is met).  Patient agreed to services and verbal consent obtained.   Follow up plan: Telephone appointment with care management team member scheduled for:05/08/2020.  [place], [place] [address] Direct Dial: [phone] [email or web address]: [email or web address]

## 2019-11-06 ENCOUNTER — Other Ambulatory Visit: Payer: Self-pay | Admitting: Family Medicine

## 2019-11-06 DIAGNOSIS — Z1231 Encounter for screening mammogram for malignant neoplasm of breast: Secondary | ICD-10-CM

## 2019-12-11 ENCOUNTER — Other Ambulatory Visit: Payer: Self-pay

## 2019-12-11 ENCOUNTER — Ambulatory Visit
Admission: RE | Admit: 2019-12-11 | Discharge: 2019-12-11 | Disposition: A | Discharge status: Home / Self Care | Payer: Medicare Other | Source: Ambulatory Visit | Attending: Family Medicine | Admitting: Family Medicine

## 2019-12-11 DIAGNOSIS — H26491 Other secondary cataract, right eye: Secondary | ICD-10-CM | POA: Diagnosis not present

## 2019-12-11 DIAGNOSIS — H40013 Open angle with borderline findings, low risk, bilateral: Secondary | ICD-10-CM | POA: Diagnosis not present

## 2019-12-11 DIAGNOSIS — Z1231 Encounter for screening mammogram for malignant neoplasm of breast: Secondary | ICD-10-CM | POA: Diagnosis not present

## 2019-12-11 DIAGNOSIS — Z961 Presence of intraocular lens: Secondary | ICD-10-CM | POA: Diagnosis not present

## 2019-12-11 DIAGNOSIS — H35033 Hypertensive retinopathy, bilateral: Secondary | ICD-10-CM | POA: Diagnosis not present

## 2019-12-11 LAB — HM DIABETES EYE EXAM

## 2020-02-15 ENCOUNTER — Encounter: Payer: Self-pay | Admitting: Family Medicine

## 2020-02-15 ENCOUNTER — Ambulatory Visit (INDEPENDENT_AMBULATORY_CARE_PROVIDER_SITE_OTHER): Payer: Medicare Other | Admitting: Family Medicine

## 2020-02-15 ENCOUNTER — Other Ambulatory Visit: Payer: Self-pay

## 2020-02-15 VITALS — BP 146/75 | HR 71 | Temp 97.0°F | Ht 64.0 in | Wt 188.0 lb

## 2020-02-15 DIAGNOSIS — E782 Mixed hyperlipidemia: Secondary | ICD-10-CM | POA: Diagnosis not present

## 2020-02-15 DIAGNOSIS — M1A9XX0 Chronic gout, unspecified, without tophus (tophi): Secondary | ICD-10-CM

## 2020-02-15 DIAGNOSIS — R609 Edema, unspecified: Secondary | ICD-10-CM | POA: Diagnosis not present

## 2020-02-15 DIAGNOSIS — I10 Essential (primary) hypertension: Secondary | ICD-10-CM | POA: Diagnosis not present

## 2020-02-15 DIAGNOSIS — K219 Gastro-esophageal reflux disease without esophagitis: Secondary | ICD-10-CM | POA: Diagnosis not present

## 2020-02-15 DIAGNOSIS — Z780 Asymptomatic menopausal state: Secondary | ICD-10-CM

## 2020-02-15 DIAGNOSIS — E785 Hyperlipidemia, unspecified: Secondary | ICD-10-CM

## 2020-02-15 MED ORDER — LISINOPRIL 40 MG PO TABS: 40.0000 mg | ORAL_TABLET | Freq: Every day | ORAL | 3 refills | Status: DC

## 2020-02-15 MED ORDER — FAMOTIDINE 20 MG PO TABS
20.0000 mg | ORAL_TABLET | Freq: Two times a day (BID) | ORAL | 3 refills | Status: DC
Start: 2020-02-15 — End: 2020-05-08

## 2020-02-15 MED ORDER — PRAVASTATIN SODIUM 80 MG PO TABS: 80.0000 mg | ORAL_TABLET | Freq: Every evening | ORAL | 3 refills | Status: DC

## 2020-02-15 MED ORDER — FUROSEMIDE 20 MG PO TABS: 20.0000 mg | ORAL_TABLET | Freq: Every day | ORAL | 3 refills | Status: DC | PRN

## 2020-02-15 MED ORDER — AMLODIPINE BESYLATE 5 MG PO TABS: 5.0000 mg | ORAL_TABLET | Freq: Every day | ORAL | 3 refills | Status: DC

## 2020-02-15 NOTE — Progress Notes (Signed)
BP (!) 146/75   Pulse 71   Temp (!) 97 F (36.1 C)   Ht 5' 4"  (1.626 m)   Wt 188 lb (85.3 kg)   SpO2 98%   BMI 32.27 kg/m    Subjective:   Patient ID: Caitlyn Boone, female    DOB: 08/30/1940, 79 y.o.   MRN: [id number]  HPI: Caitlyn Boone is a 79 y.o. female presenting on 02/15/2020 for Medical Management of Chronic Issues, Hyperlipidemia, Gastroesophageal Reflux, and Hypertension   HPI Hyperlipidemia Patient is coming in for recheck of his hyperlipidemia. The patient is currently taking pravastatin and fish oil. They deny any issues with myalgias or history of liver damage from it. They deny any focal numbness or weakness or chest pain.   Gout Last attack: Few years ago Attacks this year: None Medication: Allopurinol Location of attacks: Feet  GERD Patient is currently on omeprazole.  She denies any major symptoms or abdominal pain or belching or burping. She denies any blood in her stool or lightheadedness or dizziness.   Hypertension Patient is currently on lisinopril and amlodipine, and their blood pressure today is 146/75. Patient denies any lightheadedness or dizziness. Patient denies headaches, blurred vision, chest pains, shortness of breath, or weakness. Denies any side effects from medication and is content with current medication.   Relevant past medical, surgical, family and social history reviewed and updated as indicated. Interim medical history since our last visit reviewed. Allergies and medications reviewed and updated.  Review of Systems  Constitutional: Negative for chills and fever.  Eyes: Negative for visual disturbance.  Respiratory: Negative for chest tightness and shortness of breath.   Cardiovascular: Negative for chest pain and leg swelling.  Musculoskeletal: Negative for back pain and gait problem.  Skin: Negative for rash.  Neurological: Negative for light-headedness and headaches.  Psychiatric/Behavioral: Negative for agitation and behavioral  problems.  All other systems reviewed and are negative.   Per HPI unless specifically indicated above   Allergies as of 02/15/2020   No Known Allergies     Medication List       Accurate as of February 15, 2020  8:49 AM. If you have any questions, ask your nurse or doctor.        STOP taking these medications   STOOL SOFTENER PO Stopped by: Fransisca Kaufmann Deborahann Poteat, MD     TAKE these medications   alendronate 70 MG tablet Commonly known as: FOSAMAX TAKE 1 TABLET BY MOUTH ONCE A WEEK WITH A FULL GLASS OF WATER ON AN EMPTY STOMACH   allopurinol 300 MG tablet Commonly known as: ZYLOPRIM Take 1 tablet (300 mg total) by mouth daily.   amLODipine 5 MG tablet Commonly known as: NORVASC Take 1 tablet (5 mg total) by mouth daily.   aspirin EC 81 MG tablet Take 81 mg by mouth daily.   famotidine 20 MG tablet Commonly known as: Pepcid Take 1 tablet (20 mg total) by mouth 2 (two) times daily.   Fish Oil 1000 MG Caps Take 1 capsule by mouth.   fluticasone 50 MCG/ACT nasal spray Commonly known as: FLONASE Place 2 sprays into both nostrils as needed.   furosemide 20 MG tablet Commonly known as: LASIX Take 1 tablet (20 mg total) by mouth daily as needed (for swelling present in the morning).   lisinopril 40 MG tablet Commonly known as: ZESTRIL Take 1 tablet (40 mg total) by mouth daily.   meclizine 25 MG tablet Commonly known as: ANTIVERT Take 1  tablet (25 mg total) by mouth 3 (three) times daily as needed for dizziness.   multivitamin with minerals Tabs tablet Take 1 tablet by mouth daily.   omeprazole 20 MG capsule Commonly known as: PRILOSEC Take 1 capsule (20 mg total) by mouth daily.   pravastatin 80 MG tablet Commonly known as: PRAVACHOL Take 1 tablet (80 mg total) by mouth every evening.   Vitamin D 50 MCG (2000 UT) Caps Take 1 capsule by mouth daily.        Objective:   BP (!) 146/75   Pulse 71   Temp (!) 97 F (36.1 C)   Ht 5' 4"  (1.626 m)    Wt 188 lb (85.3 kg)   SpO2 98%   BMI 32.27 kg/m   Wt Readings from Last 3 Encounters:  02/15/20 188 lb (85.3 kg)  08/15/19 185 lb (83.9 kg)  02/14/19 179 lb 12.8 oz (81.6 kg)    Physical Exam Vitals and nursing note reviewed.  Constitutional:      General: She is not in acute distress.    Appearance: She is well-developed. She is not diaphoretic.  Eyes:     Conjunctiva/sclera: Conjunctivae normal.  Cardiovascular:     Rate and Rhythm: Normal rate and regular rhythm.     Heart sounds: Normal heart sounds. No murmur heard.   Pulmonary:     Effort: Pulmonary effort is normal. No respiratory distress.     Breath sounds: Normal breath sounds. No wheezing.  Musculoskeletal:        General: No tenderness. Normal range of motion.  Skin:    General: Skin is warm and dry.     Findings: No rash.  Neurological:     Mental Status: She is alert and oriented to person, place, and time.     Coordination: Coordination normal.  Psychiatric:        Behavior: Behavior normal.     Results for orders placed or performed in visit on 12/24/19  HM DIABETES EYE EXAM  Result Value Ref Range   HM Diabetic Eye Exam No Retinopathy No Retinopathy    Assessment & Plan:   Problem List Items Addressed This Visit      Cardiovascular and Mediastinum   Essential hypertension   Relevant Medications   amLODipine (NORVASC) 5 MG tablet   lisinopril (ZESTRIL) 40 MG tablet   pravastatin (PRAVACHOL) 80 MG tablet   furosemide (LASIX) 20 MG tablet   Other Relevant Orders   CMP14+EGFR     Digestive   GERD (gastroesophageal reflux disease)   Relevant Medications   famotidine (PEPCID) 20 MG tablet   Other Relevant Orders   CBC with Differential/Platelet     Other   Hyperlipidemia - Primary   Relevant Medications   amLODipine (NORVASC) 5 MG tablet   lisinopril (ZESTRIL) 40 MG tablet   pravastatin (PRAVACHOL) 80 MG tablet   furosemide (LASIX) 20 MG tablet   Other Relevant Orders   Lipid panel    Gout   Relevant Orders   Uric acid    Other Visit Diagnoses    Swelling       Relevant Medications   furosemide (LASIX) 20 MG tablet   Postmenopausal       Relevant Orders   DG [facility] DEXA      Continue current medication, no changes. Patient seems to be doing well, allowing permissive hypertension but she says her blood pressures running better at home. Follow up plan: Return in about 6 months (around 08/14/2020),  or if symptoms worsen or fail to improve, for htn and gerd.  Counseling provided for all of the vaccine components No orders of the defined types were placed in this encounter.   Caryl Pina, MD [facility] 02/15/2020, 8:49 AM

## 2020-02-16 LAB — CBC WITH DIFFERENTIAL/PLATELET
Basophils Absolute: 0.1 10*3/uL (ref 0.0–0.2)
Basos: 1 %
EOS (ABSOLUTE): 0 10*3/uL (ref 0.0–0.4)
Eos: 1 %
Hematocrit: 42.5 % (ref 34.0–46.6)
Hemoglobin: 13.9 g/dL (ref 11.1–15.9)
Immature Grans (Abs): 0 10*3/uL (ref 0.0–0.1)
Immature Granulocytes: 0 %
Lymphocytes Absolute: 1.3 10*3/uL (ref 0.7–3.1)
Lymphs: 29 %
MCH: 29.6 pg (ref 26.6–33.0)
MCHC: 32.7 g/dL (ref 31.5–35.7)
MCV: 91 fL (ref 79–97)
Monocytes Absolute: 0.5 10*3/uL (ref 0.1–0.9)
Monocytes: 10 %
Neutrophils Absolute: 2.7 10*3/uL (ref 1.4–7.0)
Neutrophils: 59 %
Platelets: 324 10*3/uL (ref 150–450)
RBC: 4.69 x10E6/uL (ref 3.77–5.28)
RDW: 14.5 % (ref 11.7–15.4)
WBC: 4.6 10*3/uL (ref 3.4–10.8)

## 2020-02-16 LAB — CMP14+EGFR
ALT: 30 IU/L (ref 0–32)
AST: 36 IU/L (ref 0–40)
Albumin/Globulin Ratio: 1.4 (ref 1.2–2.2)
Albumin: 4.4 g/dL (ref 3.7–4.7)
Alkaline Phosphatase: 71 IU/L (ref 48–121)
BUN/Creatinine Ratio: 15 (ref 12–28)
BUN: 12 mg/dL (ref 8–27)
Bilirubin Total: 0.3 mg/dL (ref 0.0–1.2)
CO2: 26 mmol/L (ref 20–29)
Calcium: 10.4 mg/dL — ABNORMAL HIGH (ref 8.7–10.3)
Chloride: 103 mmol/L (ref 96–106)
Creatinine, Ser: 0.78 mg/dL (ref 0.57–1.00)
GFR calc Af Amer: 83 mL/min/{1.73_m2} (ref >59)
GFR calc non Af Amer: 72 mL/min/{1.73_m2} (ref >59)
Globulin, Total: 3.2 g/dL (ref 1.5–4.5)
Glucose: 131 mg/dL — ABNORMAL HIGH (ref 65–99)
Potassium: 4 mmol/L (ref 3.5–5.2)
Sodium: 141 mmol/L (ref 134–144)
Total Protein: 7.6 g/dL (ref 6.0–8.5)

## 2020-02-16 LAB — URIC ACID: Uric Acid: 3.9 mg/dL (ref 3.1–7.9)

## 2020-02-16 LAB — LIPID PANEL
Chol/HDL Ratio: 4.2 ratio (ref 0.0–4.4)
Cholesterol, Total: 193 mg/dL (ref 100–199)
HDL: 46 mg/dL (ref >39)
LDL Chol Calc (NIH): 108 mg/dL — ABNORMAL HIGH (ref 0–99)
Triglycerides: 228 mg/dL — ABNORMAL HIGH (ref 0–149)
VLDL Cholesterol Cal: 39 mg/dL (ref 5–40)

## 2020-02-20 ENCOUNTER — Telehealth: Payer: Self-pay | Admitting: Family Medicine

## 2020-02-20 NOTE — Telephone Encounter (Signed)
Patient notified of lab results. Verbalized understanding.

## 2020-03-21 ENCOUNTER — Ambulatory Visit (INDEPENDENT_AMBULATORY_CARE_PROVIDER_SITE_OTHER): Payer: Medicare Other

## 2020-03-21 ENCOUNTER — Other Ambulatory Visit: Payer: Self-pay

## 2020-03-21 DIAGNOSIS — Z23 Encounter for immunization: Secondary | ICD-10-CM

## 2020-04-02 ENCOUNTER — Ambulatory Visit (INDEPENDENT_AMBULATORY_CARE_PROVIDER_SITE_OTHER): Payer: Medicare Other

## 2020-04-02 ENCOUNTER — Other Ambulatory Visit: Payer: Self-pay

## 2020-04-02 DIAGNOSIS — M810 Age-related osteoporosis without current pathological fracture: Secondary | ICD-10-CM | POA: Diagnosis not present

## 2020-04-02 DIAGNOSIS — Z780 Asymptomatic menopausal state: Secondary | ICD-10-CM | POA: Diagnosis not present

## 2020-05-08 ENCOUNTER — Ambulatory Visit: Payer: Medicare Other | Admitting: *Deleted

## 2020-05-08 DIAGNOSIS — M810 Age-related osteoporosis without current pathological fracture: Secondary | ICD-10-CM

## 2020-05-08 DIAGNOSIS — E782 Mixed hyperlipidemia: Secondary | ICD-10-CM

## 2020-05-08 DIAGNOSIS — I10 Essential (primary) hypertension: Secondary | ICD-10-CM

## 2020-05-08 NOTE — Patient Instructions (Signed)
Visit Information  Goals Addressed            This Visit's Progress   . Lifestyle Change       Patient Activities . agree to work together to make changes . learn about high blood pressure  . DASH diet . Increase activity level with a goal of 150 minutes of moderate physical activity weekly . Monitor/Manage Fluid Retention:  o Weigh and record weight each morning after urinating and before eating o Notify PCP of any weight gain of more than 3 lbs over night or more than 5 lbs in one week o Prop feet up when resting o Take Lasix daily as needed for fluid in feet/legs    . Prevent Falls and Manage Osteoporosis       Patient Activities: . always use handrails on the stairs . always wear shoes or slippers with non-slip sole . get at least 10 minutes of activity every day . keep cell phone with me always . pick up clutter from the floors . remove, or use a non-slip pad, with my throw rugs . use a nightlight in the bathroom  . Move carefully to avoid falls . Take medications as prescribed . Have bone density scan at least every 2 years   Why is this important?    When you fall, there are 3 things that control if a bone breaks or not.   These are the fall itself, how hard and the direction that you fall and how fragile your bones are.   Preventing falls is very important for you because of fragile bones.     Notes:     . Track and Manage My Blood Pressure       Patient Activities: . check blood pressure 3 times per week . write blood pressure results in a log or diary  . Bring log to appointments with PCP . Call PCP at [phone] with any blood pressure readings that are above 145/90   Why is this important?    You won't feel high blood pressure, but it can still hurt your blood vessels.   High blood pressure can cause heart or kidney problems. It can also cause a stroke.   Making lifestyle changes like losing a little weight or eating less salt will help.    Checking your blood pressure at home and at different times of the day can help to control blood pressure.   If the doctor prescribes medicine remember to take it the way the doctor ordered.   Call the office if you cannot afford the medicine or if there are questions about it.     Notes:        Caitlyn Boone was given information about Chronic Care Management services including:  1. CCM service includes personalized support from designated clinical staff supervised by her physician, including individualized plan of care and coordination with other care providers 2. 24/7 contact phone numbers for assistance for urgent and routine care needs. 3. Service will only be billed when office clinical staff spend 20 minutes or more in a month to coordinate care. 4. Only one practitioner may furnish and bill the service in a calendar month. 5. The patient may stop CCM services at any time (effective at the end of the month) by phone call to the office staff. 6. The patient will be responsible for cost sharing (co-pay) of up to 20% of the service fee (after annual deductible is met).  Patient agreed to services  and verbal consent obtained.   The patient verbalized understanding of instructions, educational materials, and care plan provided today and declined offer to receive copy of patient instructions, educational materials, and care plan.   Telephone follow up appointment with care management team member scheduled for: 06/25/2020 with RN Care Manager Next PCP appointment scheduled for: 08/14/2020 with RN Care Manager  Chong Sicilian, BSN, RN-BC [facility] Direct Dial: [phone]

## 2020-05-08 NOTE — Chronic Care Management (AMB) (Signed)
Chronic Care Management   Initial Visit Note  16/06/958 Name: Caitlyn Boone MRN: [id number] DOB: 11/01/1940  Referred by: Dettinger, Fransisca Kaufmann, MD Reason for referral : Chronic Care Management (Initial Visit)   Caitlyn Boone is a 79 y.o. year old female who is a primary care patient of Dettinger, Fransisca Kaufmann, MD. The CCM team was consulted for assistance with chronic disease management and care coordination needs related to HTN, HLD and osteoporosis  Review of patient status, including review of consultants reports, relevant laboratory and other test results, and collaboration with appropriate care team members and the patient's provider was performed as part of comprehensive patient evaluation and provision of chronic care management services.    SDOH (Social Determinants of Health) assessments performed: Yes See Care Plan activities for detailed interventions related to SDOH  SDOH Interventions     Most Recent Value  SDOH Interventions  Physical Activity Interventions Other (Comments)  [Encouraged increase in daily activity level. Patient babysits great grandchildren some during the week and is very active on those days.]       Medications: Outpatient Encounter Medications as of 05/08/2020  Medication Sig  . alendronate (FOSAMAX) 70 MG tablet TAKE 1 TABLET BY MOUTH ONCE A WEEK WITH A FULL GLASS OF WATER ON AN EMPTY STOMACH  . allopurinol (ZYLOPRIM) 300 MG tablet Take 1 tablet (300 mg total) by mouth daily.  Marland Kitchen amLODipine (NORVASC) 5 MG tablet Take 1 tablet (5 mg total) by mouth daily.  Marland Kitchen aspirin EC 81 MG tablet Take 81 mg by mouth daily.  . Cholecalciferol (VITAMIN D) 2000 UNITS CAPS Take 1 capsule by mouth daily.  . fluticasone (FLONASE) 50 MCG/ACT nasal spray Place 2 sprays into both nostrils as needed.  . furosemide (LASIX) 20 MG tablet Take 1 tablet (20 mg total) by mouth daily as needed (for swelling present in the morning).  Marland Kitchen lisinopril (ZESTRIL) 40 MG tablet Take 1 tablet (40 mg  total) by mouth daily.  . meclizine (ANTIVERT) 25 MG tablet Take 1 tablet (25 mg total) by mouth 3 (three) times daily as needed for dizziness.  . Multiple Vitamin (MULTIVITAMIN WITH MINERALS) TABS tablet Take 1 tablet by mouth daily.  . Omega-3 Fatty Acids (FISH OIL) 1000 MG CAPS Take 1 capsule by mouth.  Marland Kitchen omeprazole (PRILOSEC) 20 MG capsule Take 1 capsule (20 mg total) by mouth daily.  . pravastatin (PRAVACHOL) 80 MG tablet Take 1 tablet (80 mg total) by mouth every evening.  . [DISCONTINUED] famotidine (PEPCID) 20 MG tablet Take 1 tablet (20 mg total) by mouth 2 (two) times daily.   No facility-administered encounter medications on file as of 05/08/2020.     Patient Care Plan: Hypertension (Adult)  Problem Identified: Hypertension (Hypertension)   Priority: Medium    Long-Range Goal: Hypertension Monitored   Start Date: 05/08/2020  This Visit's Progress: Not on track  Priority: Medium  Note:   Objective:  Last practice recorded BP readings:  BP Readings from Last 3 Encounters:  02/15/20 (!) 146/75  08/15/19 (!) 146/81  02/14/19 (!) 169/90   Current Barriers:  Patient is not following a low sodium diet and is not taking lasix regularly for fluid retention in lower legs/feet. She does not monitor her blood pressure or weight regularly at home. Receives lunch from [employer] 5 days a week and they do not provide Heart Healthy meals. Comorbidities of hyperlipidemia and obesity.   Case Manager Clinical Goal(s):  Marland Kitchen Over the next 45 days, patient  will verbalize understanding of plan for hypertension management  Interventions:  . Chart reviewed including recent office notes and lab results . Evaluation of current treatment plan related to hypertension self management and patient's adherence to plan as established by provider. . Provided education to patient re: stroke prevention, s/s of heart attack and stroke, DASH diet, complications of uncontrolled blood pressure . Reviewed  medications with patient and discussed importance of compliance . Discussed lower extremity edema management  . Discussed plans with patient for ongoing care management follow up and provided patient with direct contact information for care management team . Advised patient, providing education and rationale, to monitor blood pressure daily and record, calling PCP for findings outside established parameters.   Patient Self Care Activities:  . Self administers medications as prescribed . Attends all scheduled provider appointments . Calls provider office for new concerns, questions, or BP outside discussed parameters . check blood pressure 3 times per week . write blood pressure results in a log or diary  . Bring log to appointments with PCP . Follow Dash diet . Increase activity level with a goal of 150 minutes of moderate physical activity weekly . Monitor/Manage Fluid Retention:  o Weigh and record weight each morning after urinating and before eating o Notify PCP of any weight gain of more than 3 lbs over night or more than 5 lbs in one week o Prop feet up when resting o Take Lasix daily as needed for fluid in feet/legs  Initial goal documentation     Patient Care Plan: Osteoporosis (Adult)  Problem Identified: Harm or Injury (Osteoporosis)   Priority: Medium    Long-Range Goal: Harm or Injury Prevented   Start Date: 05/08/2020  This Visit's Progress: Not on track  Priority: Medium  Note:   Current Barriers:  History of falls and subsequent spinal and patella fractures in patient with osteoporosis   Nurse Case Manager Clinical Goal(s):  Marland Kitchen Over the next 90 days, patient will verbalize using fall risk reduction strategies discussed . Over the next 90 days, patient will not experience additional falls . Over the next 90 days, patient will take fosamax as prescribed for osteoporosis treatment  Interventions:  . Chart reviewed including recent off notes, lab results, and imaging  studies . Reviewed and discussed recent bone density scan  . Reviewed and discussed medications: Fosamax . Provided written and verbal education re: Potential causes of falls and Fall prevention strategies . Assessed for s/s of orthostatic hypotension . Assessed for falls since last encounter . Discussed history of falls and subsequent patella and spinal fractures . Assessed patients knowledge of fall risk prevention secondary to previously provided education. . Encouraged patient to have bone density scan at least every 2 years  Patient Self Care Activities:  . always use handrails on the stairs . always wear shoes or slippers with non-slip sole . get at least 10 minutes of activity every day . keep cell phone with me always . pick up clutter from the floors . remove, or use a non-slip pad, with my throw rugs . use a nightlight in the bathroom  . Move carefully to avoid falls . Take medications as prescribed . Have bone density scan at least every 2 years   Initial goal documentation     Plan:  Telephone follow up appointment with care management team member scheduled for: 06/25/2020 with RN Care Manager The patient has been provided with contact information for the care management team and has been advised  to call with any health related questions or concerns.   Chong Sicilian, BSN, RN-BC Embedded Chronic Care Manager [facility] Direct Dial: [phone]

## 2020-06-12 DIAGNOSIS — H40013 Open angle with borderline findings, low risk, bilateral: Secondary | ICD-10-CM | POA: Diagnosis not present

## 2020-06-25 ENCOUNTER — Ambulatory Visit: Payer: Medicare Other | Admitting: *Deleted

## 2020-07-03 NOTE — Chronic Care Management (AMB) (Signed)
   6/31/4970  Daniele Dillow Pruitt [birth year] [id number]  Unable to complete telephone follow-up visit. Visit rescheduled for 07/11/2020.  Chong Sicilian, BSN, RN-BC Embedded Chronic Care Manager [facility] / [employer] [facility] Direct Dial: [phone]

## 2020-07-11 ENCOUNTER — Ambulatory Visit (INDEPENDENT_AMBULATORY_CARE_PROVIDER_SITE_OTHER): Payer: Medicare Other | Admitting: *Deleted

## 2020-07-11 DIAGNOSIS — E782 Mixed hyperlipidemia: Secondary | ICD-10-CM

## 2020-07-11 DIAGNOSIS — I10 Essential (primary) hypertension: Secondary | ICD-10-CM

## 2020-07-11 NOTE — Patient Instructions (Addendum)
  Visit Information  PATIENT GOALS: Goals Addressed            This Visit's Progress   . Manage Cholesterol   Not on track    Timeframe:  Long-Range Goal Priority:  Medium Start Date: 07/11/20                            Expected End Date: 01/08/21                      Follow-up: 08/12/20   . Take pravastatin as prescribed . Decrease fatty and fried foods in diet . Follow DASH diet as much as possible . Encouraged increased activity level as tolerated  . Keep follow-up appointment with PCP on 08/14/20     . Track and Manage My Blood Pressure   Not on track    Timeframe:  Long-Range Goal Priority:  High Start Date:  07/11/20                           Expected End Date: 01/08/21                     Follow-up: 08/12/20  Patient Activities: . check blood pressure daily . write blood pressure results in a log or diary  . Bring log to appointments with PCP . Do not add extra salt to foods and read food labels for sodium content . Call PCP at [phone] with any blood pressure readings that are above 145/90 . Check and record your weight each morning after urinating . Call PCP if you gain more than 3 lbs overnight or more than 5 lbs in one week . Take Lasix on days that your feet/legs are swollen or on days that you have gained weight overnight   Why is this important?    You won't feel high blood pressure, but it can still hurt your blood vessels.   High blood pressure can cause heart or kidney problems. It can also cause a stroke.   Making lifestyle changes like losing a little weight or eating less salt will help.   Checking your blood pressure at home and at different times of the day can help to control blood pressure.   If the doctor prescribes medicine remember to take it the way the doctor ordered.   Call the office if you cannot afford the medicine or if there are questions about it.     Notes:        Patient verbalizes understanding of instructions provided today  and agrees to view in [place].    Follow Up Plan:  . Telephone follow-up scheduled with RN Care Manager for 08/12/20 . Patient should reach out to [facility] team as needed . Next PCP appointment scheduled for 08/14/20 with Dr Dettinger  Chong Sicilian, BSN, RN-BC [place] [facility] Direct Dial: [phone]

## 2020-07-11 NOTE — Chronic Care Management (AMB) (Signed)
Chronic Care Management   CCM RN Visit Note  07/11/2020 Name: Caitlyn Boone MRN: [id number] DOB: 1941-05-29  Subjective: Caitlyn Boone is a 80 y.o. year old female who is a primary care patient of Dettinger, Elige Radon, MD. The care management team was consulted for assistance with disease management and care coordination needs.    Engaged with patient by telephone for follow up visit in response to provider referral for case management and/or care coordination services.   Consent to Services:  The patient was given information about Chronic Care Management services, agreed to services, and gave verbal consent prior to initiation of services.  Please see initial visit note for detailed documentation.   Patient agreed to services and verbal consent obtained.   Assessment: Review of patient past medical history, allergies, medications, health status, including review of consultants reports, laboratory and other test data, was performed as part of comprehensive evaluation and provision of chronic care management services.   SDOH (Social Determinants of Health) assessments and interventions performed:  No  CCM Care Plan  No Known Allergies  Outpatient Encounter Medications as of 07/11/2020  Medication Sig  . alendronate (FOSAMAX) 70 MG tablet TAKE 1 TABLET BY MOUTH ONCE A WEEK WITH A FULL GLASS OF WATER ON AN EMPTY STOMACH  . allopurinol (ZYLOPRIM) 300 MG tablet Take 1 tablet (300 mg total) by mouth daily.  Marland Kitchen amLODipine (NORVASC) 5 MG tablet Take 1 tablet (5 mg total) by mouth daily.  Marland Kitchen aspirin EC 81 MG tablet Take 81 mg by mouth daily.  . Cholecalciferol (VITAMIN D) 2000 UNITS CAPS Take 1 capsule by mouth daily.  . fluticasone (FLONASE) 50 MCG/ACT nasal spray Place 2 sprays into both nostrils as needed.  . furosemide (LASIX) 20 MG tablet Take 1 tablet (20 mg total) by mouth daily as needed (for swelling present in the morning).  Marland Kitchen lisinopril (ZESTRIL) 40 MG tablet Take 1 tablet (40 mg total)  by mouth daily.  . meclizine (ANTIVERT) 25 MG tablet Take 1 tablet (25 mg total) by mouth 3 (three) times daily as needed for dizziness.  . Multiple Vitamin (MULTIVITAMIN WITH MINERALS) TABS tablet Take 1 tablet by mouth daily.  . Omega-3 Fatty Acids (FISH OIL) 1000 MG CAPS Take 1 capsule by mouth.  Marland Kitchen omeprazole (PRILOSEC) 20 MG capsule Take 1 capsule (20 mg total) by mouth daily.  . pravastatin (PRAVACHOL) 80 MG tablet Take 1 tablet (80 mg total) by mouth every evening.   No facility-administered encounter medications on file as of 07/11/2020.    Patient Active Problem List   Diagnosis Date Noted  . Closed nondisplaced fracture of left patella 03/09/2018  . Obesity (BMI 30.0-34.9) 02/16/2016  . Gallstones 06/28/2014  . Hyperlipidemia 02/19/2014  . Osteoporosis 02/19/2014  . Essential hypertension 02/19/2014  . GERD (gastroesophageal reflux disease) 02/19/2014  . Gout 02/19/2014    Conditions to be addressed/monitored:HTN, HLD and osteoporosis  Care Plan : RNCM: Hypertension (Adult)  Updates made by Gwenith Daily, RN since 07/11/2020 12:00 AM    Problem: Hypertension (Hypertension)   Priority: High    Long-Range Goal: Hypertension Monitored   Start Date: 05/08/2020  This Visit's Progress: Not on track  Recent Progress: Not on track  Priority: High  Note:   Objective:  Last practice recorded BP readings:  BP Readings from Last 3 Encounters:  02/15/20 (!) 146/75  08/15/19 (!) 146/81  02/14/19 (!) 169/90   Current Barriers:  . Chronic disease management needs related to hypertension .  Patient is not following a low sodium diet and is not taking lasix regularly for fluid retention in lower legs/feet . She does not monitor her blood pressure or weight regularly at home . Receives lunch from [employer] 5 days a week and they do not provide Heart Healthy meals . Comorbidities of hyperlipidemia and obesity  Case Manager Clinical Goal(s):  Marland Kitchen Over the next 60 days, patient  will attend all scheduled medical appointments: Dr Warrick Parisian 08/14/20 . Over the next 60 days, patient will demonstrate improved adherence to prescribed treatment plan for hypertension as evidenced by taking all medications as prescribed, monitoring and recording blood pressure as directed, adhering to low sodium/DASH diet  Interventions:  . Chart reviewed including recent office notes and lab results . Evaluation of current treatment plan related to hypertension self management and patient's adherence to plan as established by provider. . Reviewed medications with patient and discussed importance of compliance . Discussed lower extremity edema management  o Recommended that she prop her legs up when sitting and explained dependent edema o Reinforced that she should be weighing and recording her weight each morning after urinating o Take lasix 20mg  for lower extremity edema or weight gain overnight . Reinforced the importance of checking and recording her blood pressure daily . Advised to call PCP with any blood pressure readings outside of recommended range or if she gains more than 3 lbs overnight or 5 lbs within a week . Recommended low sodium/DASH diet o Recognized that [employer] does not follow a low sodium diet o Do not add extra salt to food . Provided with RN care manager contact number and encouraged to reach out as needed  Over the next 60 days, patient will: . check blood pressure daily . write blood pressure results in a log or diary  . Bring log to appointments with PCP . Do not add extra salt to foods and read food labels for sodium content . Call PCP at [phone] with any blood pressure readings that are above 145/90 . Check and record your weight each morning after urinating . Call PCP if you gain more than 3 lbs overnight or more than 5 lbs in one week . Take Lasix on days that your feet/legs are swollen or on days that you have gained weight overnight      Care  Plan : RNCM: Hyperlipidemia  Updates made by Ilean China, RN since 07/11/2020 12:00 AM    Problem: Hyperlipidemia   Priority: Medium    Long-Range Goal: Manage Hyperlipidemia   Start Date: 07/11/2020  This Visit's Progress: Not on track  Priority: Medium  Note:   Current Barriers:  . Chronic Disease Management support and education needs related to hyperlipidemia . [employer] on Whe[employer] and they don't provide specific meals that are low in fat, cholesterol, or sodium  Nurse Case Manager Clinical Goal(s):  Marland Kitchen Over the next 60 days, patient will work with Consulting civil engineer to address needs related to hyperlipidemia . Over the next 60 days, patient will attend all scheduled medical appointments: Dr Warrick Parisian 08/14/20  Interventions:  . 1:1 collaboration with Dettinger, Fransisca Kaufmann, MD regarding development and update of comprehensive plan of care as evidenced by provider attestation and co-signature . Inter-disciplinary care team collaboration (see longitudinal plan of care) . Evaluation of current treatment plan related to hyperlipidemia and patient's adherence to plan as established by provider. . Chart reviewed including relevant office notes and lab results . Reviewed and  discussed medications: Pravastatin . Discussed diet o [employer] [facility] [employer] o PCP recommended to watch fried and fatty foods . Reviewed upcoming appointments: PCP 08/14/20 . Encouraged patient to reach out to [facility] as needed  Patient Goals/Self-Care Activities Over the next 60 days, patient will: . Take pravastatin as prescribed . Decrease fatty and fried foods in diet . Follow DASH diet as much as possible . Encouraged increased activity level as tolerated  . Keep follow-up appointment with PCP on 08/14/20      Follow Up Plan:  . Telephone follow-up scheduled with RN Care Manager for 08/12/20 . Patient should reach out to [facility] team as needed . Next PCP appointment scheduled for 08/14/20 with Dr  Dettinger  Chong Sicilian, BSN, RN-BC [place] [facility] [place] [facility] Direct Dial: [phone]

## 2020-08-12 ENCOUNTER — Ambulatory Visit (INDEPENDENT_AMBULATORY_CARE_PROVIDER_SITE_OTHER): Payer: Medicare Other | Admitting: *Deleted

## 2020-08-12 DIAGNOSIS — E782 Mixed hyperlipidemia: Secondary | ICD-10-CM | POA: Diagnosis not present

## 2020-08-12 DIAGNOSIS — I10 Essential (primary) hypertension: Secondary | ICD-10-CM | POA: Diagnosis not present

## 2020-08-12 NOTE — Chronic Care Management (AMB) (Signed)
Chronic Care Management   CCM RN Visit Note  08/13/1015 Name: SHIRLIE ENCK MRN: [id number] DOB: [birth year]  Subjective: Caitlyn Boone is a 80 y.o. year old female who is a primary care patient of Dettinger, Fransisca Kaufmann, MD. The care management team was consulted for assistance with disease management and care coordination needs.    Engaged with patient by telephone for follow up visit in response to provider referral for case management and/or care coordination services.   Consent to Services:  The patient was given information about Chronic Care Management services, agreed to services, and gave verbal consent prior to initiation of services.  Please see initial visit note for detailed documentation.   Patient agreed to services and verbal consent obtained.   Assessment: Review of patient past medical history, allergies, medications, health status, including review of consultants reports, laboratory and other test data, was performed as part of comprehensive evaluation and provision of chronic care management services.   SDOH (Social Determinants of Health) assessments and interventions performed:    CCM Care Plan  No Known Allergies  Outpatient Encounter Medications as of 08/12/2020  Medication Sig  . alendronate (FOSAMAX) 70 MG tablet TAKE 1 TABLET BY MOUTH ONCE A WEEK WITH A FULL GLASS OF WATER ON AN EMPTY STOMACH  . allopurinol (ZYLOPRIM) 300 MG tablet Take 1 tablet (300 mg total) by mouth daily.  Marland Kitchen amLODipine (NORVASC) 5 MG tablet Take 1 tablet (5 mg total) by mouth daily.  Marland Kitchen aspirin EC 81 MG tablet Take 81 mg by mouth daily.  . Cholecalciferol (VITAMIN D) 2000 UNITS CAPS Take 1 capsule by mouth daily.  . fluticasone (FLONASE) 50 MCG/ACT nasal spray Place 2 sprays into both nostrils as needed.  . furosemide (LASIX) 20 MG tablet Take 1 tablet (20 mg total) by mouth daily as needed (for swelling present in the morning).  Marland Kitchen lisinopril (ZESTRIL) 40 MG tablet Take 1 tablet (40 mg total) by  mouth daily.  . meclizine (ANTIVERT) 25 MG tablet Take 1 tablet (25 mg total) by mouth 3 (three) times daily as needed for dizziness.  . Multiple Vitamin (MULTIVITAMIN WITH MINERALS) TABS tablet Take 1 tablet by mouth daily.  . Omega-3 Fatty Acids (FISH OIL) 1000 MG CAPS Take 1 capsule by mouth.  Marland Kitchen omeprazole (PRILOSEC) 20 MG capsule Take 1 capsule (20 mg total) by mouth daily.  . pravastatin (PRAVACHOL) 80 MG tablet Take 1 tablet (80 mg total) by mouth every evening.   No facility-administered encounter medications on file as of 08/12/2020.    Patient Active Problem List   Diagnosis Date Noted  . Closed nondisplaced fracture of left patella 03/09/2018  . Obesity (BMI 30.0-34.9) 02/16/2016  . Gallstones 06/28/2014  . Hyperlipidemia 02/19/2014  . Osteoporosis 02/19/2014  . Essential hypertension 02/19/2014  . GERD (gastroesophageal reflux disease) 02/19/2014  . Gout 02/19/2014    Conditions to be addressed/monitored:HTN and HLD  Care Plan : RNCM: Hypertension (Adult)  Updates made by Ilean China, RN since 08/12/2020 12:00 AM    Problem: Hypertension (Hypertension)   Priority: High    Long-Range Goal: Hypertension Monitored   Start Date: 05/08/2020  This Visit's Progress: Not on track  Recent Progress: Not on track  Priority: High  Note:   Objective:  Last practice recorded BP readings:  BP Readings from Last 3 Encounters:  02/15/20 (!) 146/75  08/15/19 (!) 146/81  02/14/19 (!) 169/90   Current Barriers:  . Chronic disease management needs related to hypertension . Patient  is not following a low sodium diet and is not taking lasix regularly for fluid retention in lower legs/feet . She does not monitor her blood pressure or weight regularly at home . Receives lunch from [employer] 5 days a week and they do not provide Heart Healthy meals . Comorbidities of hyperlipidemia and obesity  Case Manager Clinical Goal(s):  Marland Kitchen Over the next 60 days, patient will attend all  scheduled medical appointments: Dr Warrick Parisian 08/14/20 . Over the next 60 days, patient will demonstrate improved adherence to prescribed treatment plan for hypertension as evidenced by taking all medications as prescribed, monitoring and recording blood pressure as directed, adhering to low sodium/DASH diet  Interventions:  . Chart reviewed including recent office notes and lab results . Evaluation of current treatment plan related to hypertension self management and patient's adherence to plan as established by provider. . Reviewed medications with patient and discussed importance of compliance . Discussed lower extremity edema management  o Previously recommended that she prop her legs up when sitting and explained dependent edema - Patient has been doing this and it is helping to decrease lower extremity edema o Reinforced that she should be weighing and recording her weight each morning after urinating - She is not weighing regularly o Take lasix 20mg  for lower extremity edema or weight gain overnight - Has not taken in a few days but edema has not worsened. Has actually improved since she started propping her feet up . Reinforced the importance of checking and recording her blood pressure daily o Not checking her blood pressure regularly o Had a "House Call" visit from [place] on 08/05/19 and she checked her blood pressure and weight . Reminded to call PCP with any blood pressure readings outside of recommended range or if she gains more than 3 lbs overnight or 5 lbs within a week . Reinforced low sodium/DASH diet o Recognized that [employer] does not follow a low sodium diet o Do not add extra salt to food . Provided with RN care manager contact number and encouraged to reach out as needed  Over the next 60 days, patient will: . check blood pressure daily . write blood pressure results in a log or diary  . Bring log to appointments with PCP . Do not add extra salt to foods and  read food labels for sodium content . Call PCP at [phone] with any blood pressure readings that are above 145/90 . Check and record your weight each morning after urinating . Call PCP if you gain more than 3 lbs overnight or more than 5 lbs in one week . Take Lasix on days that your feet/legs are swollen or on days that you have gained weight overnight . Prop feet up when sitting    Care Plan : RNCM: Hyperlipidemia  Updates made by Ilean China, RN since 08/12/2020 12:00 AM    Problem: Hyperlipidemia   Priority: Medium    Long-Range Goal: Manage Hyperlipidemia   Start Date: 07/11/2020  This Visit's Progress: Not on track  Recent Progress: Not on track  Priority: Medium  Note:   Current Barriers:  . Chronic Disease Management support and education needs related to hyperlipidemia . [employer] and they don't provide specific meals that are low in fat, cholesterol, or sodium  Nurse Case Manager Clinical Goal(s):  Marland Kitchen Over the next 60 days, patient will work with Consulting civil engineer to address needs related to hyperlipidemia . Over the next  60 days, patient will attend all scheduled medical appointments: Dr Warrick Parisian 08/14/20  Interventions:  . 1:1 collaboration with Dettinger, Fransisca Kaufmann, MD regarding development and update of comprehensive plan of care as evidenced by provider attestation and co-signature . Inter-disciplinary care team collaboration (see longitudinal plan of care) . Evaluation of current treatment plan related to hyperlipidemia and patient's adherence to plan as established by provider. . Chart reviewed including relevant office notes and lab results . Reviewed and discussed medications: Pravastatin . Discussed diet o [employer] o Reinforced PCP recommendedation to watch fried and fatty foods . Reviewed upcoming appointments: PCP 08/14/20 . Encouraged patient to reach out to [place] as needed  Patient Goals/Self-Care Activities Over  the next 60 days, patient will: . Take pravastatin as prescribed . Decrease fatty and fried foods in diet . Follow DASH diet as much as possible . Encouraged increased activity level as tolerated  . Keep follow-up appointment with PCP on 08/14/20     Follow Up Plan:  . Telephone follow-up scheduled with RN Care Manager for 10/01/20 . Patient should reach out to [facility] team as needed . Next PCP appointment scheduled for 08/14/20 with Dr Dettinger  Chong Sicilian, BSN, RN-BC [place] [facility] [place] Management Direct Dial: [phone]

## 2020-08-12 NOTE — Patient Instructions (Signed)
Visit Information  PATIENT GOALS: Goals Addressed            This Visit's Progress   . Manage Cholesterol   Not on track    Timeframe:  Long-Range Goal Priority:  Medium Start Date: 07/11/20                            Expected End Date: 01/08/21                      Follow-up: 10/01/20   . Take pravastatin as prescribed . Decrease fatty and fried foods in diet . Follow DASH diet as much as possible . Encouraged increased activity level as tolerated  . Keep follow-up appointment with PCP on 08/14/20     . Track and Manage My Blood Pressure   Not on track    Timeframe:  Long-Range Goal Priority:  High Start Date:  07/11/20                           Expected End Date: 01/08/21                     Follow-up: 10/01/20  Patient Activities: . check blood pressure daily . write blood pressure results in a log or diary  . Bring log to appointments with PCP . Do not add extra salt to foods and read food labels for sodium content . Call PCP at [phone] with any blood pressure readings that are above 145/90 . Check and record your weight each morning after urinating . Call PCP if you gain more than 3 lbs overnight or more than 5 lbs in one week . Take Lasix on days that your feet/legs are swollen or on days that you have gained weight overnight . Prop feet up when sitting   Why is this important?    You won't feel high blood pressure, but it can still hurt your blood vessels.   High blood pressure can cause heart or kidney problems. It can also cause a stroke.   Making lifestyle changes like losing a little weight or eating less salt will help.   Checking your blood pressure at home and at different times of the day can help to control blood pressure.   If the doctor prescribes medicine remember to take it the way the doctor ordered.   Call the office if you cannot afford the medicine or if there are questions about it.     Notes:        Patient verbalizes understanding of  instructions provided today and agrees to view in [place].   Follow Up Plan:  . Telephone follow-up scheduled with RN Care Manager for 10/01/20 . Patient should reach out to [facility] team as needed . Next PCP appointment scheduled for 08/14/20 with Dr Dettinger  Chong Sicilian, BSN, RN-BC [place] [facility] [place] Management Direct Dial: [phone]

## 2020-08-14 ENCOUNTER — Ambulatory Visit (INDEPENDENT_AMBULATORY_CARE_PROVIDER_SITE_OTHER): Payer: Medicare Other | Admitting: Family Medicine

## 2020-08-14 ENCOUNTER — Other Ambulatory Visit: Payer: Self-pay

## 2020-08-14 ENCOUNTER — Encounter: Payer: Self-pay | Admitting: Family Medicine

## 2020-08-14 DIAGNOSIS — M1A9XX0 Chronic gout, unspecified, without tophus (tophi): Secondary | ICD-10-CM

## 2020-08-14 DIAGNOSIS — R609 Edema, unspecified: Secondary | ICD-10-CM

## 2020-08-14 DIAGNOSIS — R42 Dizziness and giddiness: Secondary | ICD-10-CM

## 2020-08-14 DIAGNOSIS — M810 Age-related osteoporosis without current pathological fracture: Secondary | ICD-10-CM

## 2020-08-14 DIAGNOSIS — K219 Gastro-esophageal reflux disease without esophagitis: Secondary | ICD-10-CM

## 2020-08-14 DIAGNOSIS — E785 Hyperlipidemia, unspecified: Secondary | ICD-10-CM | POA: Diagnosis not present

## 2020-08-14 DIAGNOSIS — I10 Essential (primary) hypertension: Secondary | ICD-10-CM | POA: Diagnosis not present

## 2020-08-14 LAB — CBC WITH DIFFERENTIAL/PLATELET
Basophils Absolute: 0.1 10*3/uL (ref 0.0–0.2)
Basos: 1 %
EOS (ABSOLUTE): 0.1 10*3/uL (ref 0.0–0.4)
Eos: 1 %
Hematocrit: 43.1 % (ref 34.0–46.6)
Hemoglobin: 14.1 g/dL (ref 11.1–15.9)
Immature Grans (Abs): 0 10*3/uL (ref 0.0–0.1)
Immature Granulocytes: 0 %
Lymphocytes Absolute: 2.1 10*3/uL (ref 0.7–3.1)
Lymphs: 35 %
MCH: 29.5 pg (ref 26.6–33.0)
MCHC: 32.7 g/dL (ref 31.5–35.7)
MCV: 90 fL (ref 79–97)
Monocytes Absolute: 0.6 10*3/uL (ref 0.1–0.9)
Monocytes: 10 %
Neutrophils Absolute: 3.2 10*3/uL (ref 1.4–7.0)
Neutrophils: 53 %
Platelets: 372 10*3/uL (ref 150–450)
RBC: 4.78 x10E6/uL (ref 3.77–5.28)
RDW: 15.1 % (ref 11.7–15.4)
WBC: 6 10*3/uL (ref 3.4–10.8)

## 2020-08-14 LAB — CMP14+EGFR
ALT: 32 IU/L (ref 0–32)
AST: 39 IU/L (ref 0–40)
Albumin/Globulin Ratio: 1.4 (ref 1.2–2.2)
Albumin: 4.4 g/dL (ref 3.7–4.7)
Alkaline Phosphatase: 85 IU/L (ref 44–121)
BUN/Creatinine Ratio: 13 (ref 12–28)
BUN: 10 mg/dL (ref 8–27)
Bilirubin Total: <0.2 mg/dL (ref 0.0–1.2)
CO2: 23 mmol/L (ref 20–29)
Calcium: 10.6 mg/dL — ABNORMAL HIGH (ref 8.7–10.3)
Chloride: 103 mmol/L (ref 96–106)
Creatinine, Ser: 0.8 mg/dL (ref 0.57–1.00)
Globulin, Total: 3.2 g/dL (ref 1.5–4.5)
Glucose: 86 mg/dL (ref 65–99)
Potassium: 4.6 mmol/L (ref 3.5–5.2)
Sodium: 140 mmol/L (ref 134–144)
Total Protein: 7.6 g/dL (ref 6.0–8.5)
eGFR: 74 mL/min/{1.73_m2} (ref >59)

## 2020-08-14 LAB — LIPID PANEL
Chol/HDL Ratio: 4.1 ratio (ref 0.0–4.4)
Cholesterol, Total: 196 mg/dL (ref 100–199)
HDL: 48 mg/dL (ref >39)
LDL Chol Calc (NIH): 111 mg/dL — ABNORMAL HIGH (ref 0–99)
Triglycerides: 212 mg/dL — ABNORMAL HIGH (ref 0–149)
VLDL Cholesterol Cal: 37 mg/dL (ref 5–40)

## 2020-08-14 MED ORDER — MECLIZINE HCL 25 MG PO TABS
25.0000 mg | ORAL_TABLET | Freq: Three times a day (TID) | ORAL | 3 refills | Status: DC | PRN
Start: 2020-08-14 — End: 2021-08-06

## 2020-08-14 MED ORDER — FUROSEMIDE 20 MG PO TABS
20.0000 mg | ORAL_TABLET | Freq: Every day | ORAL | 3 refills | Status: DC | PRN
Start: 2020-08-14 — End: 2021-08-14

## 2020-08-14 MED ORDER — ALLOPURINOL 300 MG PO TABS: 300.0000 mg | ORAL_TABLET | Freq: Every day | ORAL | 3 refills | Status: DC

## 2020-08-14 MED ORDER — AMLODIPINE BESYLATE 5 MG PO TABS: 5.0000 mg | ORAL_TABLET | Freq: Every day | ORAL | 3 refills | Status: DC

## 2020-08-14 MED ORDER — LISINOPRIL 40 MG PO TABS: 40.0000 mg | ORAL_TABLET | Freq: Every day | ORAL | 3 refills | Status: DC

## 2020-08-14 MED ORDER — FLUTICASONE PROPIONATE 50 MCG/ACT NA SUSP: 2.0000 | NASAL | 5 refills | Status: DC | PRN

## 2020-08-14 MED ORDER — OMEPRAZOLE 20 MG PO CPDR: 20.0000 mg | DELAYED_RELEASE_CAPSULE | Freq: Every day | ORAL | 3 refills | Status: DC

## 2020-08-14 NOTE — Progress Notes (Signed)
BP 137/69   Pulse 67   Temp 98.2 F (36.8 C)   Ht _0  (1.626 m)   Wt 191 lb (86.6 kg)   SpO2 98%   BMI 32.79 kg/m    Subjective:   Patient ID: Caitlyn Boone, female    DOB: 1940/06/25, 80 y.o.   MRN: [id number]  HPI: FABIANNA KEATS is a 80 y.o. female presenting on 08/14/2020 for Medical Management of Chronic Issues, Hyperlipidemia, Hypertension, and Gastroesophageal Reflux   HPI Osteoporosis/osteopenia Fractures or history of fracture: None Medication: Fosamax Duration of treatment: 6 years Last bone density scan: 1 year Last T score: -2.6  Hypertension Patient is currently on amlodipine and lisinopril and furosemide, and their blood pressure today is 162/79, at home it is running 144/76. Patient denies any lightheadedness or dizziness. Patient denies headaches, blurred vision, chest pains, shortness of breath, or weakness. Denies any side effects from medication and is content with current medication.   Hyperlipidemia Patient is coming in for recheck of his hyperlipidemia. The patient is currently taking pravastatin. They deny any issues with myalgias or history of liver damage from it. They deny any focal numbness or weakness or chest pain.   Gout Last attack: Couple years Attacks this year: None Medication: Allopurinol Location of attacks: Feet  GERD Patient is currently on Prilosec.  She denies any major symptoms or abdominal pain or belching or burping. She denies any blood in her stool or lightheadedness or dizziness.   Patient gets the occasional vertigo and uses meclizine occasionally for that.  Patient gets the occasional swelling of her legs and uses furosemide occasionally for that.  Relevant past medical, surgical, family and social history reviewed and updated as indicated. Interim medical history since our last visit reviewed. Allergies and medications reviewed and updated.  Review of Systems  Constitutional: Negative for chills and fever.  HENT:  Negative for congestion, ear discharge and ear pain.   Eyes: Negative for visual disturbance.  Respiratory: Negative for chest tightness and shortness of breath.   Cardiovascular: Negative for chest pain and leg swelling.  Genitourinary: Negative for difficulty urinating and dysuria.  Musculoskeletal: Negative for back pain and gait problem.  Skin: Negative for rash.  Neurological: Negative for dizziness, light-headedness and headaches.  Psychiatric/Behavioral: Negative for agitation and behavioral problems.  All other systems reviewed and are negative.   Per HPI unless specifically indicated above   Allergies as of 08/14/2020   No Known Allergies     Medication List       Accurate as of August 14, 2020 10:47 AM. If you have any questions, ask your nurse or doctor.        STOP taking these medications   alendronate 70 MG tablet Commonly known as: FOSAMAX Stopped by: Fransisca Kaufmann Dettinger, MD     TAKE these medications   allopurinol 300 MG tablet Commonly known as: ZYLOPRIM Take 1 tablet (300 mg total) by mouth daily.   amLODipine 5 MG tablet Commonly known as: NORVASC Take 1 tablet (5 mg total) by mouth daily.   aspirin EC 81 MG tablet Take 81 mg by mouth daily.   Fish Oil 1000 MG Caps Take 1 capsule by mouth.   fluticasone 50 MCG/ACT nasal spray Commonly known as: FLONASE Place 2 sprays into both nostrils as needed.   furosemide 20 MG tablet Commonly known as: LASIX Take 1 tablet (20 mg total) by mouth daily as needed (for swelling present in the morning).   lisinopril  40 MG tablet Commonly known as: ZESTRIL Take 1 tablet (40 mg total) by mouth daily.   meclizine 25 MG tablet Commonly known as: ANTIVERT Take 1 tablet (25 mg total) by mouth 3 (three) times daily as needed for dizziness.   multivitamin with minerals Tabs tablet Take 1 tablet by mouth daily.   omeprazole 20 MG capsule Commonly known as: PRILOSEC Take 1 capsule (20 mg total) by mouth  daily.   pravastatin 80 MG tablet Commonly known as: PRAVACHOL Take 1 tablet (80 mg total) by mouth every evening.   Vitamin D 50 MCG (2000 UT) Caps Take 1 capsule by mouth daily.        Objective:   BP 137/69   Pulse 67   Temp 98.2 F (36.8 C)   Ht _0  (1.626 m)   Wt 191 lb (86.6 kg)   SpO2 98%   BMI 32.79 kg/m   Wt Readings from Last 3 Encounters:  08/14/20 191 lb (86.6 kg)  02/15/20 188 lb (85.3 kg)  08/15/19 185 lb (83.9 kg)    Physical Exam Vitals and nursing note reviewed.  Constitutional:      General: She is not in acute distress.    Appearance: She is well-developed. She is not diaphoretic.  Eyes:     Conjunctiva/sclera: Conjunctivae normal.  Cardiovascular:     Rate and Rhythm: Normal rate and regular rhythm.     Heart sounds: Normal heart sounds. No murmur heard.   Pulmonary:     Effort: Pulmonary effort is normal. No respiratory distress.     Breath sounds: Normal breath sounds. No wheezing.  Musculoskeletal:        General: No tenderness. Normal range of motion.  Skin:    General: Skin is warm and dry.     Findings: No rash.  Neurological:     Mental Status: She is alert and oriented to person, place, and time.     Coordination: Coordination normal.  Psychiatric:        Behavior: Behavior normal.       Assessment & Plan:   Problem List Items Addressed This Visit      Cardiovascular and Mediastinum   Essential hypertension   Relevant Medications   amLODipine (NORVASC) 5 MG tablet   furosemide (LASIX) 20 MG tablet   lisinopril (ZESTRIL) 40 MG tablet   Other Relevant Orders   CMP14+EGFR     Digestive   GERD (gastroesophageal reflux disease)   Relevant Medications   meclizine (ANTIVERT) 25 MG tablet   omeprazole (PRILOSEC) 20 MG capsule   Other Relevant Orders   CBC with Differential/Platelet   CMP14+EGFR     Musculoskeletal and Integument   Osteoporosis   Relevant Orders   CMP14+EGFR     Other   Hyperlipidemia    Relevant Medications   amLODipine (NORVASC) 5 MG tablet   furosemide (LASIX) 20 MG tablet   lisinopril (ZESTRIL) 40 MG tablet   Other Relevant Orders   CMP14+EGFR   Lipid panel   Gout   Relevant Medications   allopurinol (ZYLOPRIM) 300 MG tablet   Other Relevant Orders   CMP14+EGFR    Other Visit Diagnoses    Swelling       Relevant Medications   furosemide (LASIX) 20 MG tablet   Other Relevant Orders   CMP14+EGFR   Vertigo       Relevant Medications   meclizine (ANTIVERT) 25 MG tablet   Other Relevant Orders   CMP14+EGFR  Patient has been on Fosamax for 6 years now, we will not refill it and will try calcium and vitamin D, her T score was -2.6.  If does not work out may try Prolia in the future  No change in medication, blood pressure recheck is 136/67. Follow up plan: Return in about 6 months (around 02/14/2021), or if symptoms worsen or fail to improve, for Hypertension hyperlipidemia and osteoporosis.  Counseling provided for all of the vaccine components Orders Placed This Encounter  Procedures  . CBC with Differential/Platelet  . CMP14+EGFR  . Lipid panel    Caryl Pina, MD [facility] 08/14/2020, 10:47 AM

## 2020-08-14 NOTE — Patient Instructions (Signed)
Suggest 800 international units of vitamin D daily and 1200 mg elemental calcium for osteopenia or osteoporosis.   

## 2020-09-03 ENCOUNTER — Ambulatory Visit (INDEPENDENT_AMBULATORY_CARE_PROVIDER_SITE_OTHER): Payer: Medicare Other

## 2020-09-03 DIAGNOSIS — Z0000 Encounter for general adult medical examination without abnormal findings: Secondary | ICD-10-CM

## 2020-09-03 NOTE — Progress Notes (Signed)
MEDICARE ANNUAL WELLNESS VISIT  09/03/2020  Telephone Visit Disclaimer This Medicare AWV was conducted by telephone due to national recommendations for restrictions regarding the COVID-19 Pandemic (e.g. social distancing).  I verified, using two identifiers, that I am speaking with Caitlyn Boone or their authorized healthcare agent. I discussed the limitations, risks, security, and privacy concerns of performing an evaluation and management service by telephone and the potential availability of an in-person appointment in the future. The patient expressed understanding and agreed to proceed.  Location of Patient: Home Location of Provider (nurse):  [facility]  Subjective:    Caitlyn Boone is a 80 y.o. female patient of Dettinger, Fransisca Kaufmann, MD who had a Medicare Annual Wellness Visit today via telephone. Caitlyn Boone is Retired and lives with her spouse. She has four step children, one who has passed away, seven grandchildren, and eight great grandchildren. She reports that she is socially active and does interact with friends/family regularly. She is minimally physically active and enjoys attending church and spending time with family.  Patient Care Team: Dettinger, Fransisca Kaufmann, MD as PCP - General (Family Medicine) Konrad Felix, MD as Referring Physician (Ophthalmology) Milus Banister, MD as Attending Physician (Gastroenterology) Ilean China, RN as Case Manager  Advanced Directives 09/03/2020 09/03/2019 12/28/2017 06/26/2014 05/21/2014  Does Patient Have a Medical Advance Directive? No Yes Yes Yes No  Type of Advance Directive - Clinical cytogeneticist of [employer] Power of [place];Living will -  Does patient want to make changes to medical advance directive? - - No - Patient declined No - Patient declined -  Copy of [place] in Chart? - No - copy requested Yes Yes -  Would patient like information on creating a medical advance directive? No -  Patient declined - No - Patient declined - [facility] Utilization Over the Past 12 Months: # of hospitalizations or ER visits: 0 # of surgeries: 0  Review of Systems    Patient reports that her overall health is unchanged compared to last year.  History obtained from chart review and the patient  Patient Reported Readings (BP, Pulse, CBG, Weight, etc) none  Pain Assessment Pain : No/denies pain     Current Medications & Allergies (verified) Allergies as of 09/03/2020   No Known Allergies     Medication List       Accurate as of September 03, 2020  9:06 AM. If you have any questions, ask your nurse or doctor.        allopurinol 300 MG tablet Commonly known as: ZYLOPRIM Take 1 tablet (300 mg total) by mouth daily.   amLODipine 5 MG tablet Commonly known as: NORVASC Take 1 tablet (5 mg total) by mouth daily.   aspirin EC 81 MG tablet Take 81 mg by mouth daily.   Fish Oil 1000 MG Caps Take 1 capsule by mouth.   fluticasone 50 MCG/ACT nasal spray Commonly known as: FLONASE Place 2 sprays into both nostrils as needed.   furosemide 20 MG tablet Commonly known as: LASIX Take 1 tablet (20 mg total) by mouth daily as needed (for swelling present in the morning).   lisinopril 40 MG tablet Commonly known as: ZESTRIL Take 1 tablet (40 mg total) by mouth daily.   meclizine 25 MG tablet Commonly known as: ANTIVERT Take 1 tablet (25 mg total) by mouth 3 (three) times daily as needed for dizziness.   multivitamin with minerals Tabs  tablet Take 1 tablet by mouth daily.   omeprazole 20 MG capsule Commonly known as: PRILOSEC Take 1 capsule (20 mg total) by mouth daily.   pravastatin 80 MG tablet Commonly known as: PRAVACHOL Take 1 tablet (80 mg total) by mouth every evening.   Vitamin D 50 MCG (2000 UT) Caps Take 1 capsule by mouth daily.       History (reviewed): Past Medical History:  Diagnosis Date  . Compression fracture 05/24/2013   L1  . GERD  (gastroesophageal reflux disease)   . Gout   . Hyperlipidemia   . Hypertension   . Knee fracture, left feb 2015   no surgery done  . Osteoporosis    Past Surgical History:  Procedure Laterality Date  . ABDOMINAL HYSTERECTOMY  1992   complete  . CHOLECYSTECTOMY N/A 06/28/2014   Procedure: LAPAROSCOPIC CHOLECYSTECTOMY ;  Surgeon: Fanny Skates, MD;  Location: [facility];  Service: General;  Laterality: N/A;  . EYE SURGERY Bilateral 2013   lens for cataracts  . LAMINECTOMY  1987   L5-S1  . TONSILLECTOMY  1961   Family History  Problem Relation Age of Onset  . Heart disease Mother   . Cancer Father        smoker  . Cancer Brother        lung and colon  . Colon cancer Brother   . Hip fracture Brother   . Dementia Brother 19  . COPD Brother   . Parkinson's disease Brother    Social History   Socioeconomic History  . Marital status: Married    Spouse name: Not on file  . Number of children: 0  . Years of education: 52  . Highest education level: 12th grade  Occupational History  . Occupation: Retired  Tobacco Use  . Smoking status: Former Smoker    Types: Cigarettes    Quit date: 02/24/1979    Years since quitting: 41.5  . Smokeless tobacco: Never Used  . Tobacco comment: smoked 2-3 cig a day when smoking, quit long ago  Vaping Use  . Vaping Use: Never used  Substance and Sexual Activity  . Alcohol use: No    Alcohol/week: 0.0 standard drinks  . Drug use: No  . Sexual activity: Not Currently  Other Topics Concern  . Not on file  Social History Narrative  . Not on file   Social Determinants of Health   Financial Resource Strain: Low Risk   . Difficulty of Paying Living Expenses: Not hard at all  Food Insecurity: No Food Insecurity  . Worried About Charity fundraiser in the Last Year: Never true  . Ran Out of Food in the Last Year: Never true  Transportation Needs: No Transportation Needs  . Lack of Transportation (Medical): No  . Lack of Transportation  (Non-Medical): No  Physical Activity: Inactive  . Days of Exercise per Week: 0 days  . Minutes of Exercise per Session: 0 min  Stress: Not on file  Social Connections: Socially Integrated  . Frequency of Communication with Friends and Family: More than three times a week  . Frequency of Social Gatherings with Friends and Family: More than three times a week  . Attends Religious Services: More than 4 times per year  . Active Member of Clubs or Organizations: Yes  . Attends Archivist Meetings: More than 4 times per year  . Marital Status: Married    Activities of Daily Living In your present state of health, do you  have any difficulty performing the following activities: 09/03/2020 05/08/2020  Hearing? N N  Vision? N N  Difficulty concentrating or making decisions? N N  Walking or climbing stairs? N N  Dressing or bathing? N N  Doing errands, shopping? Y N  Preparing Food and eating ? N N  Using the Toilet? N N  In the past six months, have you accidently leaked urine? N N  Do you have problems with loss of bowel control? N N  Managing your Medications? N N  Managing your Finances? N N  Housekeeping or managing your Housekeeping? N N  Some recent data might be hidden    Patient Education/ Literacy What is the last grade level you completed in school?: 12th grade  Exercise Current Exercise Habits: The patient does not participate in regular exercise at present, Exercise limited by: None identified  Diet Patient reports consuming 3 meals a day and 1 snack(s) a day Patient reports that her primary diet is: Regular Patient reports that she does have regular access to food.   Depression Screen PHQ 2/9 Scores 09/03/2020 08/14/2020 02/15/2020 08/15/2019 02/14/2019 07/19/2018 04/27/2018  PHQ - 2 Score 0 0 0 0 0 0 0     Fall Risk Fall Risk  09/03/2020 08/14/2020 05/08/2020 02/15/2020 09/03/2019  Falls in the past year? 0 0 0 0 0  Number falls in past yr: - - - - -  Injury with  Fall? - - - - -  Risk for fall due to : - - History of fall(s) - -  Follow up Falls evaluation completed - Falls evaluation completed;Falls prevention discussed - -     Objective:  Caitlyn Boone seemed alert and oriented and she participated appropriately during our telephone visit.  Blood Pressure Weight BMI  BP Readings from Last 3 Encounters:  08/14/20 137/69  02/15/20 (!) 146/75  08/15/19 (!) 146/81   Wt Readings from Last 3 Encounters:  08/14/20 191 lb (86.6 kg)  02/15/20 188 lb (85.3 kg)  08/15/19 185 lb (83.9 kg)   BMI Readings from Last 1 Encounters:  08/14/20 32.79 kg/m    *Unable to obtain current vital signs, weight, and BMI due to telephone visit type  Hearing/Vision  . Caitlyn Boone did not seem to have difficulty with hearing/understanding during the telephone conversation . Reports that she has had a formal eye exam by an eye care professional within the past year . Reports that she has not had a formal hearing evaluation within the past year *Unable to fully assess hearing and vision during telephone visit type  Cognitive Function: 6CIT Screen 09/03/2020 09/03/2019  What Year? 0 points 0 points  What month? 0 points 0 points  What time? 0 points 0 points  Count back from 20 0 points 0 points  Months in reverse 0 points 0 points  Repeat phrase 0 points 0 points  Total Score 0 0   (Normal:0-7, Significant for Dysfunction: >8)  Normal Cognitive Function Screening: Yes   Immunization & Health Maintenance Record Immunization History  Administered Date(s) Administered  . Fluad Quad(high Dose 65+) 04/02/2019, 03/21/2020  . Influenza, High Dose Seasonal PF 03/30/2016, 03/21/2017, 03/08/2018  . Influenza,inj,Quad PF,6+ Mos 04/02/2014, 05/07/2015  . Moderna Sars-Covid-2 Vaccination 06/19/2019, 07/20/2019  . Pneumococcal Conjugate-13 02/19/2014  . Pneumococcal Polysaccharide-23 03/30/2006  . Tdap 04/02/2019    Health Maintenance  Topic Date Due  . COVID-19 Vaccine  (3 - Booster for Moderna series) 08/14/2021 (Originally 01/17/2020)  . DEXA SCAN  04/02/2022  .  TETANUS/TDAP  04/01/2029  . INFLUENZA VACCINE  Completed  . PNA vac Low Risk Adult  Completed  . HPV VACCINES  Aged Out       Assessment  This is a routine wellness examination for Caitlyn Boone.  Health Maintenance: Due or Overdue There are no preventive care reminders to display for this patient.  Caitlyn Boone does not need a referral for Community Assistance: Care Management:   no Social Work:    no Prescription Assistance:  no Nutrition/Diabetes Education:  no   Plan:  Personalized Goals Goals Addressed            This Visit's Progress   . Patient Stated       09/03/2020 AWV Goal: Fall Prevention  . Over the next year, patient will decrease their risk for falls by: o Using assistive devices, such as a cane or walker, as needed o Identifying fall risks within their home and correcting them by: - Removing throw rugs - Adding handrails to stairs or ramps - Removing clutter and keeping a clear pathway throughout the home - Increasing light, especially at night - Adding shower handles/bars - Raising toilet seat o Identifying potential personal risk factors for falls: - Medication side effects - Incontinence/urgency - Vestibular dysfunction - Hearing loss - Musculoskeletal disorders - Neurological disorders - Orthostatic hypotension        Personalized Health Maintenance & Screening Recommendations  Up to date  Lung Cancer Screening Recommended: no (Low Dose CT Chest recommended if Age 46-80 years, 30 pack-year currently smoking OR have quit w/in past 15 years) Hepatitis C Screening recommended: no HIV Screening recommended: no  Advanced Directives: Written information was not prepared per patient's request.  Referrals & Orders No orders of the defined types were placed in this encounter.   Follow-up Plan . Follow-up with Dettinger, Fransisca Kaufmann, MD as  planned    I have personally reviewed and noted the following in the patient's chart:   . Medical and social history . Use of alcohol, tobacco or illicit drugs  . Current medications and supplements . Functional ability and status . Nutritional status . Physical activity . Advanced directives . List of other physicians . Hospitalizations, surgeries, and ER visits in previous 12 months . Vitals . Screenings to include cognitive, depression, and falls . Referrals and appointments  In addition, I have reviewed and discussed with Caitlyn Boone certain preventive protocols, quality metrics, and best practice recommendations. A written personalized care plan for preventive services as well as general preventive health recommendations is available and can be mailed to the patient at her request.      Felicity Coyer, LPn    09/03/2020   Patient declined after visit summary

## 2020-10-01 ENCOUNTER — Ambulatory Visit (INDEPENDENT_AMBULATORY_CARE_PROVIDER_SITE_OTHER): Payer: Medicare Other | Admitting: *Deleted

## 2020-10-01 ENCOUNTER — Encounter: Payer: Self-pay | Admitting: *Deleted

## 2020-10-01 DIAGNOSIS — M810 Age-related osteoporosis without current pathological fracture: Secondary | ICD-10-CM

## 2020-10-01 DIAGNOSIS — I10 Essential (primary) hypertension: Secondary | ICD-10-CM | POA: Diagnosis not present

## 2020-10-01 NOTE — Patient Instructions (Signed)
Visit Information  PATIENT GOALS: Goals Addressed            This Visit's Progress   . Prevent Falls and Manage Osteoporosis   On track    Timeframe:  Long-Range Goal Priority:  Medium Start Date:   05/27/20                          Expected End Date:   05/27/21                    Follow-up: 11/26/20  Patient Activities: . always use handrails on the stairs . always wear shoes or slippers with non-slip sole . get at least 10 minutes of activity every day . keep cell phone with me always . pick up clutter from the floors . remove, or use a non-slip pad, with my throw rugs . use a nightlight in the bathroom  . Move carefully to avoid falls . Take medications as prescribed . Have bone density scan at least every 2 years   Why is this important?    When you fall, there are 3 things that control if a bone breaks or not.   These are the fall itself, how hard and the direction that you fall and how fragile your bones are.   Preventing falls is very important for you because of fragile bones.     Notes:     . Track and Manage My Blood Pressure   On track    Timeframe:  Long-Range Goal Priority:  High Start Date:  07/11/20                           Expected End Date: 01/08/21                     Follow-up: 11/26/20  Patient Activities: . check blood pressure at least 3 times per week . write blood pressure results in a log or diary  . Bring log to appointments with PCP . Do not add extra salt to foods and read food labels for sodium content . Call PCP at [phone] with any blood pressure readings that are above 145/90 . Check and record your weight each morning after urinating . Call PCP if you gain more than 3 lbs overnight or more than 5 lbs in one week . Take Lasix on days that your feet/legs are swollen or on days that you have gained weight overnight . Prop feet up when sitting   Why is this important?    You won't feel high blood pressure, but it can still hurt  your blood vessels.   High blood pressure can cause heart or kidney problems. It can also cause a stroke.   Making lifestyle changes like losing a little weight or eating less salt will help.   Checking your blood pressure at home and at different times of the day can help to control blood pressure.   If the doctor prescribes medicine remember to take it the way the doctor ordered.   Call the office if you cannot afford the medicine or if there are questions about it.     Notes:        Patient verbalizes understanding of instructions provided today and agrees to view in [place].   Follow Up Plan:  . Telephone follow up appointment with care management team member scheduled for: 11/26/20 with RNCM .  The patient has been provided with contact information for the care management team and has been advised to call with any health related questions or concerns.  . Next PCP appointment scheduled for: 02/17/21 with Dr Dettinger  Chong Sicilian, BSN, RN-BC [place] [facility] [place] Management Direct Dial: [phone]

## 2020-10-01 NOTE — Chronic Care Management (AMB) (Signed)
Chronic Care Management   CCM RN Visit Note  4/00/8676 Name: Caitlyn Boone MRN: [id number] DOB: [birth year]  Subjective: Caitlyn Boone is a 80 y.o. year old female who is a primary care patient of Dettinger, Fransisca Kaufmann, MD. The care management team was consulted for assistance with disease management and care coordination needs.    Engaged with patient by telephone for follow up visit in response to provider referral for case management and/or care coordination services.   Consent to Services:  The patient was given information about Chronic Care Management services, agreed to services, and gave verbal consent prior to initiation of services.  Please see initial visit note for detailed documentation.   Patient agreed to services and verbal consent obtained.   Assessment: Review of patient past medical history, allergies, medications, health status, including review of consultants reports, laboratory and other test data, was performed as part of comprehensive evaluation and provision of chronic care management services.   SDOH (Social Determinants of Health) assessments and interventions performed:    CCM Care Plan  No Known Allergies  Outpatient Encounter Medications as of 10/01/2020  Medication Sig  . Calcium Citrate 1040 MG TABS Take by mouth.  Marland Kitchen allopurinol (ZYLOPRIM) 300 MG tablet Take 1 tablet (300 mg total) by mouth daily.  Marland Kitchen amLODipine (NORVASC) 5 MG tablet Take 1 tablet (5 mg total) by mouth daily.  Marland Kitchen aspirin EC 81 MG tablet Take 81 mg by mouth daily.  . Cholecalciferol (VITAMIN D) 2000 UNITS CAPS Take 1 capsule by mouth daily.  . fluticasone (FLONASE) 50 MCG/ACT nasal spray Place 2 sprays into both nostrils as needed.  . furosemide (LASIX) 20 MG tablet Take 1 tablet (20 mg total) by mouth daily as needed (for swelling present in the morning).  Marland Kitchen lisinopril (ZESTRIL) 40 MG tablet Take 1 tablet (40 mg total) by mouth daily.  . meclizine (ANTIVERT) 25 MG tablet Take 1 tablet (25  mg total) by mouth 3 (three) times daily as needed for dizziness.  . Multiple Vitamin (MULTIVITAMIN WITH MINERALS) TABS tablet Take 1 tablet by mouth daily.  . Omega-3 Fatty Acids (FISH OIL) 1000 MG CAPS Take 1 capsule by mouth.  Marland Kitchen omeprazole (PRILOSEC) 20 MG capsule Take 1 capsule (20 mg total) by mouth daily.  . pravastatin (PRAVACHOL) 80 MG tablet Take 1 tablet (80 mg total) by mouth every evening.   No facility-administered encounter medications on file as of 10/01/2020.    Patient Active Problem List   Diagnosis Date Noted  . Closed nondisplaced fracture of left patella 03/09/2018  . Obesity (BMI 30.0-34.9) 02/16/2016  . Gallstones 06/28/2014  . Hyperlipidemia 02/19/2014  . Osteoporosis 02/19/2014  . Essential hypertension 02/19/2014  . GERD (gastroesophageal reflux disease) 02/19/2014  . Gout 02/19/2014    Conditions to be addressed/monitored:HTN and osteoporosis  Care Plan : RNCM: Hypertension (Adult)  Updates made by Ilean China, RN since 10/01/2020 12:00 AM    Problem: Hypertension (Hypertension)   Priority: High    Long-Range Goal: Hypertension Monitored   Start Date: 05/08/2020  This Visit's Progress: On track  Recent Progress: Not on track  Priority: Medium  Note:   Objective:  BP Readings from Last 3 Encounters:  08/14/20 137/69  02/15/20 (!) 146/75  08/15/19 (!) 146/81   Current Barriers:  . Chronic disease management needs related to hypertension . Receives lunch from [employer] 5 days a week and they do not provide Heart Healthy meals . Comorbidities of hyperlipidemia and obesity  Case Manager Clinical Goal(s):  . patient will work with PCP to address needs related to medical management of hypertension . patient will meet with RN Care Manager to address self-management of hypertension . patient will demonstrate improved adherence to prescribed treatment plan for hypertension as evidenced by checking and recording blood pressure at least 3 times  per week and by calling PCP with any readings outside of recommended range  Interventions:  . 1:1 collaboration with Dettinger, Fransisca Kaufmann, MD regarding development and update of comprehensive plan of care as evidenced by provider attestation and co-signature . Inter-disciplinary care team collaboration (see longitudinal plan of care) . Evaluation of current treatment plan related to hypertension and patient's adherence to plan as established by provider. . Chart reviewed including recent office notes and lab results . Reviewed medications with patient and discussed importance of compliance o Taking lasix as needed for lower extremity edema . Discussed lower extremity edema management  o Previously recommended that she prop her legs up when sitting and explained dependent edema - Patient has been doing this and it is helping to decrease lower extremity edema o Reinforced that she should be weighing and recording her weight each morning after urinating . Reinforced the importance of checking and recording her blood pressure at least 3 times per week and to call PCP [phone] with any readings outside of recommended range . Reinforced need to weigh each morning after urinating and to call PCP with any weight gains more than 3 lbs overnight or 5 lbs within a week . Reinforced low sodium/DASH diet o Recognized that [employer] does not follow a low sodium diet o Do not add extra salt to food . Provided with RN care manager contact number and encouraged to reach out as needed  Self Care Activities:  . Calls pharmacy for medication refills . Attends church or other social activities . Performs ADL's independently . Performs IADL's independently  Patient Goals: Over the next 60 days, patient will: . check blood pressure at least 3 times per week . write blood pressure results in a log or diary  . Bring log to appointments with PCP . Do not add extra salt to foods and read food labels for  sodium content . Call PCP at [phone] with any blood pressure readings that are above 145/90 . Check and record your weight each morning after urinating . Call PCP if you gain more than 3 lbs overnight or more than 5 lbs in one week . Take Lasix on days that your feet/legs are swollen or on days that you have gained weight overnight . Prop feet up when sitting    Care Plan : RNCM: Osteoporosis (Adult)  Updates made by Ilean China, RN since 10/01/2020 12:00 AM    Problem: Harm or Injury (Osteoporosis)   Priority: Medium    Long-Range Goal: Harm or Injury Prevented   Start Date: 05/08/2020  This Visit's Progress: On track  Recent Progress: Not on track  Priority: Medium  Note:   Current Barriers:  . Chronic Disease Management support and education needs related to osteoporosis . History of falls and subsequent spinal and patella fractures in patient with osteoporosis   Nurse Case Manager Clinical Goal(s):  . patient will work with PCP to address needs related to medical management of osteoporosis . patient will meet with RN Care Manager to address self-management of osteoporosis  . the patient will demonstrate ongoing self health care management ability as evidenced by not experiencing  any additional falls*   Interventions:  . 1:1 collaboration with Dettinger, Fransisca Kaufmann, MD regarding development and update of comprehensive plan of care as evidenced by provider attestation and co-signature . Inter-disciplinary care team collaboration (see longitudinal plan of care) . Chart reviewed including recent off notes, lab results, and imaging studies o Reviewed bone density scan from 03/2020 . Evaluation of current treatment plan related to osteoporosis and fracture prevention and patient's adherence to plan as established by provider. . Reviewed and discussed medications o Fosamax d/c at last PCP office visit because she has been on it for 6 years o Taking Calcium and Vit  D . Assessed for falls since last encounter . Assessed patients knowledge of fall risk prevention secondary to previously provided education. . Encouraged patient to have bone density scan at least every 2 years . Discussed plans with patient for ongoing care management follow up and provided patient with direct contact information for care management team . Encouraged to reach out to [place] as needed  Self Care Activities:  . Self administers medications as prescribed . Attends church or other social activities . Performs ADL's independently . Performs IADL's independently  Patient Goals: Over the next 60 days, patient will: . always use handrails on the stairs . always wear shoes or slippers with non-slip sole . get at least 10 minutes of activity every day . keep cell phone with me always . pick up clutter from the floors . remove, or use a non-slip pad, with my throw rugs . use a nightlight in the bathroom  . Move carefully to avoid falls . Take medications as prescribed . Have bone density scan at least every 2 years  . Call RN Care Manager as needed [phone]    Follow Up Plan:  . Telephone follow up appointment with care management team member scheduled for: 11/26/20 with RNCM . The patient has been provided with contact information for the care management team and has been advised to call with any health related questions or concerns.  . Next PCP appointment scheduled for: 02/17/21 with Dr Dettinger  Chong Sicilian, BSN, RN-BC [place] [facility] [facility] Direct Dial: [phone]

## 2020-10-10 ENCOUNTER — Ambulatory Visit (INDEPENDENT_AMBULATORY_CARE_PROVIDER_SITE_OTHER): Payer: Medicare Other | Admitting: Nurse Practitioner

## 2020-10-10 ENCOUNTER — Encounter: Payer: Self-pay | Admitting: Nurse Practitioner

## 2020-10-10 DIAGNOSIS — R059 Cough, unspecified: Secondary | ICD-10-CM

## 2020-10-10 MED ORDER — PREDNISONE 20 MG PO TABS: 40.0000 mg | ORAL_TABLET | Freq: Every day | ORAL | 0 refills | Status: AC

## 2020-10-10 NOTE — Progress Notes (Signed)
Virtual Visit  Note Due to COVID-19 pandemic this visit was conducted virtually. This visit type was conducted due to national recommendations for restrictions regarding the COVID-19 Pandemic (e.g. social distancing, sheltering in place) in an effort to limit this patient's exposure and mitigate transmission in our community. All issues noted in this document were discussed and addressed.  A physical exam was not performed with this format.  I connected with Caitlyn Boone on 42/59/56 at 9:18  by telephone and verified that I am speaking with the correct person using two identifiers. Caitlyn Boone is currently located at home and her husband is currently with her during visit. The provider, Mary-Margaret Hassell Done, FNP is located in their office at time of visit.  I discussed the limitations, risks, security and privacy concerns of performing an evaluation and management service by telephone and the availability of in person appointments. I also discussed with the patient that there may be a patient responsible charge related to this service. The patient expressed understanding and agreed to proceed.   History and Present Illness:   Chief Complaint: Sinusitis   HPI Patient calls in today c/o cough that is productive that started 5 days ago. Slight congestion. Dizziness in the mornings but resolves once she gets up. She has not taken any OTC meds. Home covid test yesterday was negatove   Review of Systems  Constitutional: Positive for malaise/fatigue. Negative for chills and fever.  HENT: Positive for congestion. Negative for ear discharge, ear pain, sinus pain and sore throat.   Respiratory: Positive for cough and sputum production (greenish).   Neurological: Positive for dizziness. Negative for headaches.  Psychiatric/Behavioral: Negative.      Observations/Objective: Alert and oriented- answers all questions appropriately No distress Voice hoarse Deep wet cough noted during  visit   Assessment and Plan: Caitlyn Boone in today with chief complaint of Sinusitis   1. Cough 1. Take meds as prescribed 2. :Use a cool mist humidifier especially during the winter months and when heat has been humid. 3. Use saline nose sprays frequently 4. Saline irrigations of the nose can be very helpful if done frequently.  * 4X daily for 1 week*  * Use of a nettie pot can be helpful with this. Follow directions with this* 5. Drink plenty of fluids 6. Keep thermostat turn down low 7.For any cough or congestion  Use plain Mucinex or robitussin- regular strength or max strength is fine   * Children- consult with Pharmacist for dosing 8. For fever or aces or pains- take tylenol or ibuprofen appropriate for age and weight.  * for fevers greater than 101 orally you may alternate ibuprofen and tylenol every  3 hours.   Meds ordered this encounter  Medications  . predniSONE (DELTASONE) 20 MG tablet    Sig: Take 2 tablets (40 mg total) by mouth daily with breakfast for 5 days. 2 po daily for 5 days    Dispense:  10 tablet    Refill:  0    Order Specific Question:   Supervising Provider    Answer:   Caryl Pina A [[id number]]         Follow Up Instructions: prn    I discussed the assessment and treatment plan with the patient. The patient was provided an opportunity to ask questions and all were answered. The patient agreed with the plan and demonstrated an understanding of the instructions.   The patient was advised to call back or seek an in-person  evaluation if the symptoms worsen or if the condition fails to improve as anticipated.  The above assessment and management plan was discussed with the patient. The patient verbalized understanding of and has agreed to the management plan. Patient is aware to call the clinic if symptoms persist or worsen. Patient is aware when to return to the clinic for a follow-up visit. Patient educated on when it is appropriate to go to  the emergency department.   Time call ended:  9:30  I provided 12 minutes of  non face-to-face time during this encounter.    Mary-Margaret Hassell Done, FNP

## 2020-10-15 ENCOUNTER — Ambulatory Visit (INDEPENDENT_AMBULATORY_CARE_PROVIDER_SITE_OTHER): Payer: Medicare Other | Admitting: Family Medicine

## 2020-10-15 ENCOUNTER — Encounter: Payer: Self-pay | Admitting: Family Medicine

## 2020-10-15 VITALS — BP 146/80 | HR 94 | Temp 98.0°F

## 2020-10-15 DIAGNOSIS — J069 Acute upper respiratory infection, unspecified: Secondary | ICD-10-CM | POA: Diagnosis not present

## 2020-10-15 MED ORDER — AMOXICILLIN 500 MG PO CAPS: 500.0000 mg | ORAL_CAPSULE | Freq: Two times a day (BID) | ORAL | 0 refills | Status: DC

## 2020-10-15 MED ORDER — BENZONATATE 100 MG PO CAPS: 100.0000 mg | ORAL_CAPSULE | Freq: Two times a day (BID) | ORAL | 0 refills | Status: DC | PRN

## 2020-10-15 NOTE — Progress Notes (Signed)
BP (!) 146/80   Pulse 94   Temp 98 F (36.7 C)   SpO2 91%    Subjective:   Patient ID: Caitlyn Boone, female    DOB: 1941/01/08, 80 y.o.   MRN: [id number]  HPI: Caitlyn Boone is a 80 y.o. female presenting on 10/15/2020 for URI (Talked Shelah Lewandowsky last Friday. Steroids given and finished this am. Coughing, dark yellow thick and green. Is some better. It wheezing and SOB. Negative home test this Monday. Having dizzy spells and loss of thought.)   HPI Patient is coming in with cough and congestion and wheezing and chills that she has had for over a week.  She was treated with some prednisone last week and it did help some but she is still having the symptoms and the chills and the body aches and picked up over the past couple days since she finished the prednisone.  She complains of some shortness of breath and wheezing at times.  She has a cough that is productive of dark sputum.  She denies any sick contacts that she knows of except for her husband is ill but he is only been ill more recently.  Relevant past medical, surgical, family and social history reviewed and updated as indicated. Interim medical history since our last visit reviewed. Allergies and medications reviewed and updated.  Review of Systems  Constitutional: Positive for chills. Negative for fever.  HENT: Positive for congestion, postnasal drip, rhinorrhea, sinus pressure, sneezing and sore throat. Negative for ear discharge and ear pain.   Eyes: Negative for pain, redness and visual disturbance.  Respiratory: Positive for cough, shortness of breath and wheezing. Negative for chest tightness.   Cardiovascular: Negative for chest pain and leg swelling.  Genitourinary: Negative for difficulty urinating and dysuria.  Musculoskeletal: Negative for back pain and gait problem.  Skin: Negative for rash.  Neurological: Negative for light-headedness and headaches.  Psychiatric/Behavioral: Negative for agitation and behavioral  problems.  All other systems reviewed and are negative.   Per HPI unless specifically indicated above   Allergies as of 10/15/2020   No Known Allergies     Medication List       Accurate as of Oct 15, 2020  4:50 PM. If you have any questions, ask your nurse or doctor.        allopurinol 300 MG tablet Commonly known as: ZYLOPRIM Take 1 tablet (300 mg total) by mouth daily.   amLODipine 5 MG tablet Commonly known as: NORVASC Take 1 tablet (5 mg total) by mouth daily.   amoxicillin 500 MG capsule Commonly known as: AMOXIL Take 1 capsule (500 mg total) by mouth 2 (two) times daily. Started by: Worthy Rancher, MD   aspirin EC 81 MG tablet Take 81 mg by mouth daily.   benzonatate 100 MG capsule Commonly known as: TESSALON Take 1 capsule (100 mg total) by mouth 2 (two) times daily as needed for cough. Started by: Fransisca Kaufmann Lihanna Biever, MD   Calcium Citrate 1040 MG Tabs Take by mouth.   Fish Oil 1000 MG Caps Take 1 capsule by mouth.   fluticasone 50 MCG/ACT nasal spray Commonly known as: FLONASE Place 2 sprays into both nostrils as needed.   furosemide 20 MG tablet Commonly known as: LASIX Take 1 tablet (20 mg total) by mouth daily as needed (for swelling present in the morning).   lisinopril 40 MG tablet Commonly known as: ZESTRIL Take 1 tablet (40 mg total) by mouth daily.  meclizine 25 MG tablet Commonly known as: ANTIVERT Take 1 tablet (25 mg total) by mouth 3 (three) times daily as needed for dizziness.   multivitamin with minerals Tabs tablet Take 1 tablet by mouth daily.   omeprazole 20 MG capsule Commonly known as: PRILOSEC Take 1 capsule (20 mg total) by mouth daily.   pravastatin 80 MG tablet Commonly known as: PRAVACHOL Take 1 tablet (80 mg total) by mouth every evening.   predniSONE 20 MG tablet Commonly known as: DELTASONE Take 2 tablets (40 mg total) by mouth daily with breakfast for 5 days. 2 po daily for 5 days   Vitamin D 50 MCG  (2000 UT) Caps Take 1 capsule by mouth daily.        Objective:   BP (!) 146/80   Pulse 94   Temp 98 F (36.7 C)   SpO2 91%   Wt Readings from Last 3 Encounters:  08/14/20 191 lb (86.6 kg)  02/15/20 188 lb (85.3 kg)  08/15/19 185 lb (83.9 kg)    Physical Exam Vitals and nursing note reviewed.  Constitutional:      General: She is not in acute distress.    Appearance: She is well-developed. She is not diaphoretic.  Eyes:     Conjunctiva/sclera: Conjunctivae normal.  Cardiovascular:     Rate and Rhythm: Normal rate and regular rhythm.     Heart sounds: Normal heart sounds. No murmur heard.   Pulmonary:     Effort: Pulmonary effort is normal. No respiratory distress.     Breath sounds: Normal breath sounds. No wheezing, rhonchi or rales.  Chest:     Chest wall: No tenderness.  Musculoskeletal:        General: No tenderness. Normal range of motion.  Skin:    General: Skin is warm and dry.     Findings: No rash.  Neurological:     Mental Status: She is alert and oriented to person, place, and time.     Coordination: Coordination normal.  Psychiatric:        Behavior: Behavior normal.       Assessment & Plan:   Problem List Items Addressed This Visit   None   Visit Diagnoses    Upper respiratory tract infection, unspecified type    -  Primary   Relevant Medications   amoxicillin (AMOXIL) 500 MG capsule   benzonatate (TESSALON) 100 MG capsule   Other Relevant Orders   Novel Coronavirus, NAA ([facility])      Will treat like possible bronchitis, it sounds more like a bronchitis starting the head into her chest.  We will give amoxicillin and Tessalon Perles we will do COVID testing. Follow up plan: Return if symptoms worsen or fail to improve.  Counseling provided for all of the vaccine components Orders Placed This Encounter  Procedures  . Novel Coronavirus, NAA ([facility])    Caryl Pina, MD [facility] 10/15/2020, 4:50  PM

## 2020-10-17 LAB — NOVEL CORONAVIRUS, NAA

## 2020-11-17 ENCOUNTER — Other Ambulatory Visit: Payer: Self-pay | Admitting: Family Medicine

## 2020-11-17 DIAGNOSIS — Z1231 Encounter for screening mammogram for malignant neoplasm of breast: Secondary | ICD-10-CM

## 2020-11-26 ENCOUNTER — Telehealth: Payer: Medicare Other | Admitting: *Deleted

## 2020-12-11 DIAGNOSIS — H26491 Other secondary cataract, right eye: Secondary | ICD-10-CM | POA: Diagnosis not present

## 2020-12-11 DIAGNOSIS — Z961 Presence of intraocular lens: Secondary | ICD-10-CM | POA: Diagnosis not present

## 2020-12-11 DIAGNOSIS — H40013 Open angle with borderline findings, low risk, bilateral: Secondary | ICD-10-CM | POA: Diagnosis not present

## 2020-12-11 DIAGNOSIS — H524 Presbyopia: Secondary | ICD-10-CM | POA: Diagnosis not present

## 2020-12-11 DIAGNOSIS — H34231 Retinal artery branch occlusion, right eye: Secondary | ICD-10-CM | POA: Diagnosis not present

## 2020-12-11 LAB — HM DIABETES EYE EXAM

## 2020-12-16 ENCOUNTER — Other Ambulatory Visit: Payer: Self-pay | Admitting: Family Medicine

## 2020-12-16 DIAGNOSIS — J069 Acute upper respiratory infection, unspecified: Secondary | ICD-10-CM

## 2021-01-09 ENCOUNTER — Ambulatory Visit
Admission: RE | Admit: 2021-01-09 | Discharge: 2021-01-09 | Disposition: A | Discharge status: Home / Self Care | Payer: Medicare Other | Source: Ambulatory Visit | Attending: Family Medicine | Admitting: Family Medicine

## 2021-01-09 ENCOUNTER — Other Ambulatory Visit: Payer: Self-pay

## 2021-01-09 DIAGNOSIS — Z1231 Encounter for screening mammogram for malignant neoplasm of breast: Secondary | ICD-10-CM | POA: Diagnosis not present

## 2021-02-17 ENCOUNTER — Ambulatory Visit (INDEPENDENT_AMBULATORY_CARE_PROVIDER_SITE_OTHER): Payer: Medicare Other | Admitting: Family Medicine

## 2021-02-17 ENCOUNTER — Other Ambulatory Visit: Payer: Self-pay

## 2021-02-17 ENCOUNTER — Encounter: Payer: Self-pay | Admitting: Family Medicine

## 2021-02-17 VITALS — BP 153/84 | HR 82 | Ht 64.0 in | Wt 187.0 lb

## 2021-02-17 DIAGNOSIS — E785 Hyperlipidemia, unspecified: Secondary | ICD-10-CM

## 2021-02-17 DIAGNOSIS — K219 Gastro-esophageal reflux disease without esophagitis: Secondary | ICD-10-CM

## 2021-02-17 DIAGNOSIS — I10 Essential (primary) hypertension: Secondary | ICD-10-CM

## 2021-02-17 MED ORDER — PRAVASTATIN SODIUM 80 MG PO TABS: 80.0000 mg | ORAL_TABLET | Freq: Every evening | ORAL | 3 refills | Status: DC

## 2021-02-17 NOTE — Progress Notes (Signed)
BP (!) 153/84   Pulse 82   Ht 5' 4" (1.626 m)   Wt 187 lb (84.8 kg)   SpO2 93%   BMI 32.10 kg/m    Subjective:   Patient ID: Caitlyn Boone, female    DOB: [birth year], 80 y.o.   MRN: [id number]  HPI: Caitlyn Boone is a 80 y.o. female presenting on 02/17/2021 for Medical Management of Chronic Issues, Hypertension, and Hyperlipidemia   HPI Hypertension Patient is currently on lasix and lisinopril, and their blood pressure today is 153/84. Patient denies any lightheadedness or dizziness. Patient denies headaches, blurred vision, chest pains, shortness of breath, or weakness. Denies any side effects from medication and is content with current medication.   Hyperlipidemia Patient is coming in for recheck of his hyperlipidemia. The patient is currently taking fish oils and pravastatin. They deny any issues with myalgias or history of liver damage from it. They deny any focal numbness or weakness or chest pain.   GERD Patient is currently on omeprazole.  She denies any major symptoms or abdominal pain or belching or burping. She denies any blood in her stool or lightheadedness or dizziness.   Relevant past medical, surgical, family and social history reviewed and updated as indicated. Interim medical history since our last visit reviewed. Allergies and medications reviewed and updated.  Review of Systems  Constitutional:  Negative for chills and fever.  Eyes:  Negative for visual disturbance.  Respiratory:  Negative for chest tightness and shortness of breath.   Cardiovascular:  Negative for chest pain and leg swelling.  Musculoskeletal:  Negative for back pain and gait problem.  Skin:  Negative for rash.  Neurological:  Negative for light-headedness and headaches.  Psychiatric/Behavioral:  Negative for agitation and behavioral problems.   All other systems reviewed and are negative.  Per HPI unless specifically indicated above   Allergies as of 02/17/2021   No Known Allergies       Medication List        Accurate as of February 17, 2021 11:24 AM. If you have any questions, ask your nurse or doctor.          STOP taking these medications    amoxicillin 500 MG capsule Commonly known as: AMOXIL Stopped by: Fransisca Kaufmann Jerolene Kupfer, MD       TAKE these medications    allopurinol 300 MG tablet Commonly known as: ZYLOPRIM Take 1 tablet (300 mg total) by mouth daily.   amLODipine 5 MG tablet Commonly known as: NORVASC Take 1 tablet (5 mg total) by mouth daily.   aspirin EC 81 MG tablet Take 81 mg by mouth daily.   benzonatate 100 MG capsule Commonly known as: TESSALON Take 1 capsule (100 mg total) by mouth 2 (two) times daily as needed for cough.   Calcium Citrate 1040 MG Tabs Take by mouth.   Fish Oil 1000 MG Caps Take 1 capsule by mouth.   fluticasone 50 MCG/ACT nasal spray Commonly known as: FLONASE Place 2 sprays into both nostrils as needed.   furosemide 20 MG tablet Commonly known as: LASIX Take 1 tablet (20 mg total) by mouth daily as needed (for swelling present in the morning).   lisinopril 40 MG tablet Commonly known as: ZESTRIL Take 1 tablet (40 mg total) by mouth daily.   meclizine 25 MG tablet Commonly known as: ANTIVERT Take 1 tablet (25 mg total) by mouth 3 (three) times daily as needed for dizziness.   multivitamin with minerals Tabs tablet  Take 1 tablet by mouth daily.   omeprazole 20 MG capsule Commonly known as: PRILOSEC Take 1 capsule (20 mg total) by mouth daily.   pravastatin 80 MG tablet Commonly known as: PRAVACHOL Take 1 tablet (80 mg total) by mouth every evening.   Vitamin D 50 MCG (2000 UT) Caps Take 1 capsule by mouth daily.         Objective:   BP (!) 153/84   Pulse 82   Ht 5' 4" (1.626 m)   Wt 187 lb (84.8 kg)   SpO2 93%   BMI 32.10 kg/m   Wt Readings from Last 3 Encounters:  02/17/21 187 lb (84.8 kg)  08/14/20 191 lb (86.6 kg)  02/15/20 188 lb (85.3 kg)    Physical Exam Vitals  and nursing note reviewed.  Constitutional:      General: She is not in acute distress.    Appearance: She is well-developed. She is not diaphoretic.  Eyes:     Conjunctiva/sclera: Conjunctivae normal.  Cardiovascular:     Rate and Rhythm: Normal rate and regular rhythm.     Heart sounds: Normal heart sounds. No murmur heard. Pulmonary:     Effort: Pulmonary effort is normal. No respiratory distress.     Breath sounds: Normal breath sounds. No wheezing.  Musculoskeletal:        General: No tenderness. Normal range of motion.  Skin:    General: Skin is warm and dry.     Findings: No rash.  Neurological:     Mental Status: She is alert and oriented to person, place, and time.     Coordination: Coordination normal.  Psychiatric:        Behavior: Behavior normal.      Assessment & Plan:   Problem List Items Addressed This Visit       Cardiovascular and Mediastinum   Essential hypertension - Primary   Relevant Medications   pravastatin (PRAVACHOL) 80 MG tablet   Other Relevant Orders   CMP14+EGFR     Digestive   GERD (gastroesophageal reflux disease)   Relevant Orders   CBC with Differential/Platelet     Other   Hyperlipidemia   Relevant Medications   pravastatin (PRAVACHOL) 80 MG tablet   Other Relevant Orders   Lipid panel    Continue with Coricidin, no change medicine Follow up plan: Return in about 6 months (around 08/17/2021), or if symptoms worsen or fail to improve, for htn and hld.  Counseling provided for all of the vaccine components Orders Placed This Encounter  Procedures   CBC with Differential/Platelet   CMP14+EGFR   Lipid panel    Caryl Pina, MD [facility] 02/17/2021, 11:24 AM

## 2021-02-18 LAB — CBC WITH DIFFERENTIAL/PLATELET
Basophils Absolute: 0.1 10*3/uL (ref 0.0–0.2)
Basos: 1 %
EOS (ABSOLUTE): 0 10*3/uL (ref 0.0–0.4)
Eos: 1 %
Hematocrit: 44.3 % (ref 34.0–46.6)
Hemoglobin: 15.2 g/dL (ref 11.1–15.9)
Immature Grans (Abs): 0 10*3/uL (ref 0.0–0.1)
Immature Granulocytes: 0 %
Lymphocytes Absolute: 0.9 10*3/uL (ref 0.7–3.1)
Lymphs: 14 %
MCH: 30 pg (ref 26.6–33.0)
MCHC: 34.3 g/dL (ref 31.5–35.7)
MCV: 87 fL (ref 79–97)
Monocytes Absolute: 0.7 10*3/uL (ref 0.1–0.9)
Monocytes: 12 %
Neutrophils Absolute: 4.4 10*3/uL (ref 1.4–7.0)
Neutrophils: 72 %
Platelets: 300 10*3/uL (ref 150–450)
RBC: 5.07 x10E6/uL (ref 3.77–5.28)
RDW: 14.4 % (ref 11.7–15.4)
WBC: 6.1 10*3/uL (ref 3.4–10.8)

## 2021-02-18 LAB — CMP14+EGFR
ALT: 58 IU/L — ABNORMAL HIGH (ref 0–32)
AST: 101 IU/L — ABNORMAL HIGH (ref 0–40)
Albumin/Globulin Ratio: 1.3 (ref 1.2–2.2)
Albumin: 4.7 g/dL — ABNORMAL HIGH (ref 3.6–4.6)
Alkaline Phosphatase: 77 IU/L (ref 44–121)
BUN/Creatinine Ratio: 12 (ref 12–28)
BUN: 11 mg/dL (ref 8–27)
Bilirubin Total: 0.2 mg/dL (ref 0.0–1.2)
CO2: 26 mmol/L (ref 20–29)
Calcium: 10.7 mg/dL — ABNORMAL HIGH (ref 8.7–10.3)
Chloride: 100 mmol/L (ref 96–106)
Creatinine, Ser: 0.91 mg/dL (ref 0.57–1.00)
Globulin, Total: 3.5 g/dL (ref 1.5–4.5)
Glucose: 81 mg/dL (ref 65–99)
Potassium: 4.4 mmol/L (ref 3.5–5.2)
Sodium: 140 mmol/L (ref 134–144)
Total Protein: 8.2 g/dL (ref 6.0–8.5)
eGFR: 63 mL/min/{1.73_m2} (ref >59)

## 2021-02-18 LAB — LIPID PANEL
Chol/HDL Ratio: 3.9 ratio (ref 0.0–4.4)
Cholesterol, Total: 208 mg/dL — ABNORMAL HIGH (ref 100–199)
HDL: 53 mg/dL (ref >39)
LDL Chol Calc (NIH): 117 mg/dL — ABNORMAL HIGH (ref 0–99)
Triglycerides: 218 mg/dL — ABNORMAL HIGH (ref 0–149)
VLDL Cholesterol Cal: 38 mg/dL (ref 5–40)

## 2021-02-19 ENCOUNTER — Other Ambulatory Visit: Payer: Self-pay

## 2021-02-19 DIAGNOSIS — R748 Abnormal levels of other serum enzymes: Secondary | ICD-10-CM

## 2021-02-23 ENCOUNTER — Other Ambulatory Visit: Payer: Self-pay | Admitting: Family Medicine

## 2021-02-23 DIAGNOSIS — I10 Essential (primary) hypertension: Secondary | ICD-10-CM

## 2021-03-03 ENCOUNTER — Other Ambulatory Visit: Payer: Medicare Other

## 2021-03-03 ENCOUNTER — Other Ambulatory Visit: Payer: Self-pay

## 2021-03-03 DIAGNOSIS — R748 Abnormal levels of other serum enzymes: Secondary | ICD-10-CM | POA: Diagnosis not present

## 2021-03-04 LAB — HEPATIC FUNCTION PANEL
ALT: 45 IU/L — ABNORMAL HIGH (ref 0–32)
AST: 54 IU/L — ABNORMAL HIGH (ref 0–40)
Albumin: 4.4 g/dL (ref 3.6–4.6)
Alkaline Phosphatase: 79 IU/L (ref 44–121)
Bilirubin Total: 0.3 mg/dL (ref 0.0–1.2)
Bilirubin, Direct: 0.11 mg/dL (ref 0.00–0.40)
Total Protein: 7.5 g/dL (ref 6.0–8.5)

## 2021-03-19 ENCOUNTER — Encounter: Payer: Self-pay | Admitting: Family Medicine

## 2021-03-19 ENCOUNTER — Other Ambulatory Visit: Payer: Self-pay

## 2021-03-19 ENCOUNTER — Ambulatory Visit (INDEPENDENT_AMBULATORY_CARE_PROVIDER_SITE_OTHER): Payer: Medicare Other | Admitting: Family Medicine

## 2021-03-19 ENCOUNTER — Ambulatory Visit (INDEPENDENT_AMBULATORY_CARE_PROVIDER_SITE_OTHER): Payer: Medicare Other

## 2021-03-19 VITALS — BP 153/75 | HR 88 | Ht 64.0 in | Wt 188.0 lb

## 2021-03-19 DIAGNOSIS — R29898 Other symptoms and signs involving the musculoskeletal system: Secondary | ICD-10-CM

## 2021-03-19 DIAGNOSIS — R296 Repeated falls: Secondary | ICD-10-CM

## 2021-03-19 DIAGNOSIS — Z9181 History of falling: Secondary | ICD-10-CM

## 2021-03-19 DIAGNOSIS — R0789 Other chest pain: Secondary | ICD-10-CM | POA: Diagnosis not present

## 2021-03-19 NOTE — Progress Notes (Signed)
BP (!) 153/75   Pulse 88   Ht 5\' 4"  (1.626 m)   Wt 188 lb (85.3 kg)   SpO2 94%   BMI 32.27 kg/m    Subjective:   Patient ID: Caitlyn Boone, female    DOB: November 11, 1940, 80 y.o.   MRN: [id number]  HPI: Caitlyn Boone is a 80 y.o. female presenting on 03/19/2021 for Fall (Face first. Geryl Councilman, chest, wrist hurt)   HPI Patient had a fall on Friday about 6 days ago where she tripped over some blankets she was carrying.  She fell forward and landed with her hands out and hurt her right wrist some and her chin and her right chest/breast area when she fell.  She says she would not of fallen if she had not tripped over something.  She does states she has been a little more stable and this is not the first time she is fallen.  She does not want to do any physical therapy for balance but does want a walker.  She fell and broke part of her kneecap last year and is becoming increasingly unstable with balance.  Relevant past medical, surgical, family and social history reviewed and updated as indicated. Interim medical history since our last visit reviewed. Allergies and medications reviewed and updated.  Review of Systems  Constitutional:  Negative for chills and fever.  Eyes:  Negative for visual disturbance.  Respiratory:  Negative for chest tightness and shortness of breath.   Cardiovascular:  Negative for chest pain and leg swelling.  Musculoskeletal:  Positive for arthralgias, gait problem and myalgias. Negative for back pain and joint swelling.  Skin:  Negative for rash.  Neurological:  Negative for light-headedness and headaches.  Psychiatric/Behavioral:  Negative for agitation and behavioral problems.   All other systems reviewed and are negative.  Per HPI unless specifically indicated above   Allergies as of 03/19/2021   No Known Allergies      Medication List        Accurate as of March 19, 2021  8:53 AM. If you have any questions, ask your nurse or doctor.           allopurinol 300 MG tablet Commonly known as: ZYLOPRIM Take 1 tablet (300 mg total) by mouth daily.   amLODipine 5 MG tablet Commonly known as: NORVASC Take 1 tablet by mouth once daily   aspirin EC 81 MG tablet Take 81 mg by mouth daily.   benzonatate 100 MG capsule Commonly known as: TESSALON Take 1 capsule (100 mg total) by mouth 2 (two) times daily as needed for cough.   Calcium Citrate 1040 MG Tabs Take by mouth.   Fish Oil 1000 MG Caps Take 1 capsule by mouth.   fluticasone 50 MCG/ACT nasal spray Commonly known as: FLONASE Place 2 sprays into both nostrils as needed.   furosemide 20 MG tablet Commonly known as: LASIX Take 1 tablet (20 mg total) by mouth daily as needed (for swelling present in the morning).   lisinopril 40 MG tablet Commonly known as: ZESTRIL Take 1 tablet (40 mg total) by mouth daily.   meclizine 25 MG tablet Commonly known as: ANTIVERT Take 1 tablet (25 mg total) by mouth 3 (three) times daily as needed for dizziness.   multivitamin with minerals Tabs tablet Take 1 tablet by mouth daily.   omeprazole 20 MG capsule Commonly known as: PRILOSEC Take 1 capsule (20 mg total) by mouth daily.   pravastatin 80 MG tablet Commonly known  as: PRAVACHOL Take 1 tablet (80 mg total) by mouth every evening.   Vitamin D 50 MCG (2000 UT) Caps Take 1 capsule by mouth daily.               Durable Medical Equipment  (From admission, onward)           Start     Ordered   03/19/21 0000  For home use only DME 4 wheeled rolling walker with seat ([id number])       Comments: Diagnosis recurrent falls and muscular deconditioning and high risk for falls Wheeled walker with a seat  Question Answer Comment  Patient needs a walker to treat with the following condition Recurrent falls   Patient needs a walker to treat with the following condition Muscular deconditioning   Patient needs a walker to treat with the following condition At high risk for  injury related to fall      03/19/21 0849             Objective:   BP (!) 153/75   Pulse 88   Ht 5\' 4"  (1.626 m)   Wt 188 lb (85.3 kg)   SpO2 94%   BMI 32.27 kg/m   Wt Readings from Last 3 Encounters:  03/19/21 188 lb (85.3 kg)  02/17/21 187 lb (84.8 kg)  08/14/20 191 lb (86.6 kg)    Physical Exam Vitals and nursing note reviewed.  Constitutional:      General: She is not in acute distress.    Appearance: Normal appearance. She is well-developed. She is not diaphoretic.  Eyes:     Conjunctiva/sclera: Conjunctivae normal.  Cardiovascular:     Rate and Rhythm: Normal rate and regular rhythm.     Heart sounds: Normal heart sounds. No murmur heard. Pulmonary:     Effort: Pulmonary effort is normal. No respiratory distress.     Breath sounds: Normal breath sounds. No wheezing.  Chest:     Chest wall: Tenderness (Chest wall tenderness and right lower ribs on an axillary line and slight tenderness over breast area, bruising noted on breast on the right) present.  Musculoskeletal:        General: No tenderness. Normal range of motion.     Right wrist: No tenderness or snuff box tenderness. Normal range of motion.     Comments: Slight bruising around the base of her right thumb on the wrist, no tenderness with range of motion or palpation anywhere in the hand or the wrist  Skin:    General: Skin is warm and dry.     Findings: Bruising (Bruising on chin, slightly tender but able to move and talk with her jaw without any pain.) present. No rash.  Neurological:     Mental Status: She is alert and oriented to person, place, and time.     Coordination: Coordination normal.  Psychiatric:        Behavior: Behavior normal.      Assessment & Plan:   Problem List Items Addressed This Visit   None Visit Diagnoses     Recurrent falls    -  Primary   Relevant Orders   For home use only DME 4 wheeled rolling walker with seat ([id number])   DG Ribs Unilateral W/Chest Right    Muscular deconditioning       Relevant Orders   For home use only DME 4 wheeled rolling walker with seat ([id number])   DG Ribs Unilateral W/Chest Right   At high risk for  injury related to fall       Relevant Orders   For home use only DME 4 wheeled rolling walker with seat ([id number])   DG Ribs Unilateral W/Chest Right       Will give walker and do rib x-ray on the right side, does not appear to have a fractured wrist based on exam or anywhere else.   Follow up plan: Return if symptoms worsen or fail to improve.  Counseling provided for all of the vaccine components Orders Placed This Encounter  Procedures   For home use only DME 4 wheeled rolling walker with seat ([id number])   DG Ribs Unilateral W/Chest Right    Caryl Pina, MD [facility] 03/19/2021, 8:53 AM

## 2021-03-23 ENCOUNTER — Ambulatory Visit (INDEPENDENT_AMBULATORY_CARE_PROVIDER_SITE_OTHER): Payer: Medicare Other

## 2021-03-23 ENCOUNTER — Other Ambulatory Visit: Payer: Self-pay

## 2021-03-23 DIAGNOSIS — Z23 Encounter for immunization: Secondary | ICD-10-CM | POA: Diagnosis not present

## 2021-04-08 ENCOUNTER — Other Ambulatory Visit: Payer: Self-pay

## 2021-04-08 DIAGNOSIS — R29898 Other symptoms and signs involving the musculoskeletal system: Secondary | ICD-10-CM

## 2021-04-08 DIAGNOSIS — R296 Repeated falls: Secondary | ICD-10-CM

## 2021-04-08 DIAGNOSIS — Z9181 History of falling: Secondary | ICD-10-CM

## 2021-08-03 ENCOUNTER — Other Ambulatory Visit: Payer: Self-pay | Admitting: Family Medicine

## 2021-08-03 DIAGNOSIS — K219 Gastro-esophageal reflux disease without esophagitis: Secondary | ICD-10-CM

## 2021-08-06 ENCOUNTER — Ambulatory Visit (INDEPENDENT_AMBULATORY_CARE_PROVIDER_SITE_OTHER): Payer: Medicare Other | Admitting: Family Medicine

## 2021-08-06 ENCOUNTER — Encounter: Payer: Self-pay | Admitting: Family Medicine

## 2021-08-06 VITALS — BP 182/90 | HR 73 | Temp 98.0°F | Ht 64.0 in | Wt 191.0 lb

## 2021-08-06 DIAGNOSIS — R112 Nausea with vomiting, unspecified: Secondary | ICD-10-CM | POA: Diagnosis not present

## 2021-08-06 DIAGNOSIS — R42 Dizziness and giddiness: Secondary | ICD-10-CM | POA: Diagnosis not present

## 2021-08-06 MED ORDER — MECLIZINE HCL 25 MG PO TABS: 25.0000 mg | ORAL_TABLET | Freq: Three times a day (TID) | ORAL | 3 refills | Status: DC | PRN

## 2021-08-06 MED ORDER — FLUOCINOLONE ACETONIDE 0.01 % EX SOLN: CUTANEOUS | 0 refills | Status: DC

## 2021-08-06 NOTE — Progress Notes (Signed)
? ?Subjective:  ?Patient ID: Caitlyn Boone, female    DOB: [birth year]  Age: 81 y.o. MRN: [id number] ? ?CC: Dizziness ? ? ?HPI ?Caitlyn Boone presents for Awoke 2 days ago. Walked to bathroom and everything was going around. Vomited. Felt bad all day. Ringing in ears. Was better yesterday. Just not right though.Head still a litle dizzy. No further vomiting. ? ?Depression screen [facility] 2/9 08/06/2021 03/19/2021 09/03/2020  ?Decreased Interest 0 0 0  ?Down, Depressed, Hopeless 0 0 0  ?PHQ - 2 Score 0 0 0  ? ? ?History ?Caitlyn Boone has a past medical history of Compression fracture (05/24/2013), GERD (gastroesophageal reflux disease), Gout, Hyperlipidemia, Hypertension, Knee fracture, left (feb 2015), and Osteoporosis.  ? ?She has a past surgical history that includes Laminectomy (1987); Abdominal hysterectomy (1992); Tonsillectomy (1961); Eye surgery (Bilateral, 2013); and Cholecystectomy (N/A, 06/28/2014).  ? ?Her family history includes COPD in her brother; Cancer in her brother and father; Colon cancer in her brother; Dementia (age of onset: 28) in her brother; Heart disease in her mother; Hip fracture in her brother; Parkinson's disease in her brother.She reports that she quit smoking about 42 years ago. Her smoking use included cigarettes. She has never used smokeless tobacco. She reports that she does not drink alcohol and does not use drugs. ? ? ? ?ROS ?Review of Systems  ?Constitutional:  Negative for fever.  ?HENT:  Negative for congestion, rhinorrhea and sore throat.   ?Respiratory:  Negative for cough and shortness of breath.   ?Cardiovascular:  Negative for chest pain and palpitations.  ?Gastrointestinal:  Positive for nausea and vomiting. Negative for abdominal pain.  ?Musculoskeletal:  Negative for arthralgias and myalgias.  ?Neurological:  Positive for dizziness and headaches. Negative for tremors and syncope.  ? ?Objective:  ?BP (!) 182/90   Pulse 73   Temp 98 ?F (36.7 ?C)   Ht 5\' 4"  (1.626 m)   Wt 191 lb (86.6  kg)   SpO2 97%   BMI 32.79 kg/m?  ? ?BP Readings from Last 3 Encounters:  ?08/06/21 (!) 182/90  ?03/19/21 (!) 153/75  ?02/17/21 (!) 153/84  ? ? ?Wt Readings from Last 3 Encounters:  ?08/06/21 191 lb (86.6 kg)  ?03/19/21 188 lb (85.3 kg)  ?02/17/21 187 lb (84.8 kg)  ? ? ? ?Physical Exam ?Constitutional:   ?   General: She is not in acute distress. ?   Appearance: She is well-developed.  ?Cardiovascular:  ?   Rate and Rhythm: Normal rate and regular rhythm.  ?Pulmonary:  ?   Breath sounds: Normal breath sounds.  ?Musculoskeletal:     ?   General: Normal range of motion.  ?Skin: ?   General: Skin is warm and dry.  ?   Coloration: Skin is not jaundiced.  ?Neurological:  ?   General: No focal deficit present.  ?   Mental Status: She is alert and oriented to person, place, and time.  ?   Cranial Nerves: Cranial nerves 2-12 are intact.  ?   Motor: No weakness.  ?   Coordination: Coordination normal.  ?   Deep Tendon Reflexes: Reflexes normal.  ? ? ? ? ?Assessment & Plan:  ? ?Caitlyn Boone was seen today for dizziness. ? ?Diagnoses and all orders for this visit: ? ?Nausea and vomiting in adult ? ?Vertigo ?-     meclizine (ANTIVERT) 25 MG tablet; Take 1 tablet (25 mg total) by mouth 3 (three) times daily as needed for dizziness. ? ?Other orders ?-  fluocinolone (SYNALAR) 0.01 % external solution; Apply two or three drops in the ear canal as needed for itching ? ? ? ? ? ? ?I am having Caitlyn Boone start on fluocinolone. I am also having her maintain her Vitamin D, aspirin EC, multivitamin with minerals, Fish Oil, allopurinol, fluticasone, furosemide, lisinopril, Calcium Citrate, benzonatate, pravastatin, amLODipine, omeprazole, and meclizine. ? ?Allergies as of 08/06/2021   ?No Known Allergies ?  ? ?  ?Medication List  ?  ? ?  ? Accurate as of August 06, 2021 11:59 PM. If you have any questions, ask your nurse or doctor.  ?  ?  ? ?  ? ?allopurinol 300 MG tablet ?Commonly known as: ZYLOPRIM ?Take 1 tablet (300 mg total) by mouth  daily. ?  ?amLODipine 5 MG tablet ?Commonly known as: NORVASC ?Take 1 tablet by mouth once daily ?  ?aspirin EC 81 MG tablet ?Take 81 mg by mouth daily. ?  ?benzonatate 100 MG capsule ?Commonly known as: TESSALON ?Take 1 capsule (100 mg total) by mouth 2 (two) times daily as needed for cough. ?  ?Calcium Citrate 1040 MG Tabs ?Take by mouth. ?  ?Fish Oil 1000 MG Caps ?Take 1 capsule by mouth. ?  ?fluocinolone 0.01 % external solution ?Commonly known as: Synalar ?Apply two or three drops in the ear canal as needed for itching ?Started by: Claretta Fraise, MD ?  ?fluticasone 50 MCG/ACT nasal spray ?Commonly known as: FLONASE ?Place 2 sprays into both nostrils as needed. ?  ?furosemide 20 MG tablet ?Commonly known as: LASIX ?Take 1 tablet (20 mg total) by mouth daily as needed (for swelling present in the morning). ?  ?lisinopril 40 MG tablet ?Commonly known as: ZESTRIL ?Take 1 tablet (40 mg total) by mouth daily. ?  ?meclizine 25 MG tablet ?Commonly known as: ANTIVERT ?Take 1 tablet (25 mg total) by mouth 3 (three) times daily as needed for dizziness. ?  ?multivitamin with minerals Tabs tablet ?Take 1 tablet by mouth daily. ?  ?omeprazole 20 MG capsule ?Commonly known as: PRILOSEC ?Take 1 capsule by mouth once daily ?  ?pravastatin 80 MG tablet ?Commonly known as: PRAVACHOL ?Take 1 tablet (80 mg total) by mouth every evening. ?  ?Vitamin D 50 MCG (2000 UT) Caps ?Take 1 capsule by mouth daily. ?  ? ?  ? ? ? ?Follow-up: Return if symptoms worsen or fail to improve. ? ?Claretta Fraise, M.D. ?

## 2021-08-09 ENCOUNTER — Encounter: Payer: Self-pay | Admitting: Family Medicine

## 2021-08-17 ENCOUNTER — Ambulatory Visit (INDEPENDENT_AMBULATORY_CARE_PROVIDER_SITE_OTHER): Payer: Medicare Other | Admitting: Family Medicine

## 2021-08-17 ENCOUNTER — Encounter: Payer: Self-pay | Admitting: Family Medicine

## 2021-08-17 VITALS — BP 166/87 | HR 70 | Ht 64.0 in | Wt 193.0 lb

## 2021-08-17 DIAGNOSIS — E785 Hyperlipidemia, unspecified: Secondary | ICD-10-CM

## 2021-08-17 DIAGNOSIS — R609 Edema, unspecified: Secondary | ICD-10-CM

## 2021-08-17 DIAGNOSIS — I10 Essential (primary) hypertension: Secondary | ICD-10-CM | POA: Diagnosis not present

## 2021-08-17 DIAGNOSIS — K219 Gastro-esophageal reflux disease without esophagitis: Secondary | ICD-10-CM | POA: Diagnosis not present

## 2021-08-17 DIAGNOSIS — M1A9XX0 Chronic gout, unspecified, without tophus (tophi): Secondary | ICD-10-CM | POA: Diagnosis not present

## 2021-08-17 LAB — LIPID PANEL

## 2021-08-17 MED ORDER — OMEPRAZOLE 20 MG PO CPDR: 20.0000 mg | DELAYED_RELEASE_CAPSULE | Freq: Every day | ORAL | 3 refills | Status: DC

## 2021-08-17 MED ORDER — ALLOPURINOL 300 MG PO TABS: 300.0000 mg | ORAL_TABLET | Freq: Every day | ORAL | 3 refills | Status: DC

## 2021-08-17 MED ORDER — AMLODIPINE BESYLATE 10 MG PO TABS: 10.0000 mg | ORAL_TABLET | Freq: Every day | ORAL | 3 refills | Status: DC

## 2021-08-17 MED ORDER — LISINOPRIL 40 MG PO TABS: 40.0000 mg | ORAL_TABLET | Freq: Every day | ORAL | 3 refills | Status: DC

## 2021-08-17 MED ORDER — FUROSEMIDE 20 MG PO TABS: 20.0000 mg | ORAL_TABLET | Freq: Every day | ORAL | 3 refills | Status: DC | PRN

## 2021-08-17 MED ORDER — FLUTICASONE PROPIONATE 50 MCG/ACT NA SUSP: 2.0000 | NASAL | 5 refills | Status: DC | PRN

## 2021-08-17 MED ORDER — AMLODIPINE BESYLATE 5 MG PO TABS: 5.0000 mg | ORAL_TABLET | Freq: Every day | ORAL | 3 refills | Status: DC

## 2021-08-17 NOTE — Progress Notes (Signed)
? ?BP (!) 166/87   Pulse 70   Ht 5' 4"  (1.626 m)   Wt 193 lb (87.5 kg)   SpO2 94%   BMI 33.13 kg/m?   ? ?Subjective:  ? ?Patient ID: Caitlyn Boone, female    DOB: [birth year], 81 y.o.   MRN: [id number] ? ?HPI: ?Caitlyn Boone is a 81 y.o. female presenting on 08/17/2021 for Medical Management of Chronic Issues, Hypertension, and Hyperlipidemia ? ? ?HPI ?Hyperlipidemia ?Patient is coming in for recheck of his hyperlipidemia. The patient is currently taking pravastatin and fish oils. They deny any issues with myalgias or history of liver damage from it. They deny any focal numbness or weakness or chest pain.  ? ?Hypertension ?Patient is currently on amlodipine and lisinopril and furosemide, and their blood pressure today is 166/87 and she has multiple numbers at home running most of the time in the 150s and 160s.. Patient denies any lightheadedness or dizziness. Patient denies headaches, blurred vision, chest pains, shortness of breath, or weakness. Denies any side effects from medication and is content with current medication.  ? ?GERD ?Patient is currently on omeprazole.  She denies any major symptoms or abdominal pain or belching or burping. She denies any blood in her stool or lightheadedness or dizziness.  ? ?Gout ?Last attack: More than a year ago, she says she has not had any attacks since she started the allopurinol ?Attacks this year: None ?Medication: Allopurinol ?Location of attacks: Feet ? ?Relevant past medical, surgical, family and social history reviewed and updated as indicated. Interim medical history since our last visit reviewed. ?Allergies and medications reviewed and updated. ? ?Review of Systems  ?Constitutional:  Negative for chills and fever.  ?Eyes:  Negative for visual disturbance.  ?Respiratory:  Negative for chest tightness and shortness of breath.   ?Cardiovascular:  Negative for chest pain and leg swelling.  ?Musculoskeletal:  Negative for back pain and gait problem.  ?Skin:  Negative for  rash.  ?Neurological:  Negative for light-headedness and headaches.  ?Psychiatric/Behavioral:  Negative for agitation and behavioral problems.   ?All other systems reviewed and are negative. ? ?Per HPI unless specifically indicated above ? ? ?Allergies as of 08/17/2021   ?No Known Allergies ?  ? ?  ?Medication List  ?  ? ?  ? Accurate as of August 17, 2021  9:57 AM. If you have any questions, ask your nurse or doctor.  ?  ?  ? ?  ? ?allopurinol 300 MG tablet ?Commonly known as: ZYLOPRIM ?Take 1 tablet (300 mg total) by mouth daily. ?  ?amLODipine 5 MG tablet ?Commonly known as: NORVASC ?Take 1 tablet by mouth once daily ?  ?aspirin EC 81 MG tablet ?Take 81 mg by mouth daily. ?  ?benzonatate 100 MG capsule ?Commonly known as: TESSALON ?Take 1 capsule (100 mg total) by mouth 2 (two) times daily as needed for cough. ?  ?Calcium Citrate 1040 MG Tabs ?Take by mouth. ?  ?Fish Oil 1000 MG Caps ?Take 1 capsule by mouth. ?  ?fluocinolone 0.01 % external solution ?Commonly known as: Synalar ?Apply two or three drops in the ear canal as needed for itching ?  ?fluticasone 50 MCG/ACT nasal spray ?Commonly known as: FLONASE ?Place 2 sprays into both nostrils as needed. ?  ?furosemide 20 MG tablet ?Commonly known as: LASIX ?Take 1 tablet (20 mg total) by mouth daily as needed (for swelling present in the morning). ?  ?lisinopril 40 MG tablet ?Commonly known as: ZESTRIL ?Take  1 tablet (40 mg total) by mouth daily. ?  ?meclizine 25 MG tablet ?Commonly known as: ANTIVERT ?Take 1 tablet (25 mg total) by mouth 3 (three) times daily as needed for dizziness. ?  ?multivitamin with minerals Tabs tablet ?Take 1 tablet by mouth daily. ?  ?omeprazole 20 MG capsule ?Commonly known as: PRILOSEC ?Take 1 capsule by mouth once daily ?  ?pravastatin 80 MG tablet ?Commonly known as: PRAVACHOL ?Take 1 tablet (80 mg total) by mouth every evening. ?  ?Vitamin D 50 MCG (2000 UT) Caps ?Take 1 capsule by mouth daily. ?  ? ?  ? ? ? ?Objective:  ? ?BP (!)  166/87   Pulse 70   Ht $R'5\' 4"'Ww$  (1.626 m)   Wt 193 lb (87.5 kg)   SpO2 94%   BMI 33.13 kg/m?   ?Wt Readings from Last 3 Encounters:  ?08/17/21 193 lb (87.5 kg)  ?08/06/21 191 lb (86.6 kg)  ?03/19/21 188 lb (85.3 kg)  ?  ?Physical Exam ?Vitals and nursing note reviewed.  ?Constitutional:   ?   General: She is not in acute distress. ?   Appearance: She is well-developed. She is not diaphoretic.  ?Eyes:  ?   Conjunctiva/sclera: Conjunctivae normal.  ?Cardiovascular:  ?   Rate and Rhythm: Normal rate and regular rhythm.  ?   Heart sounds: Normal heart sounds. No murmur heard. ?Pulmonary:  ?   Effort: Pulmonary effort is normal. No respiratory distress.  ?   Breath sounds: Normal breath sounds. No wheezing.  ?Musculoskeletal:     ?   General: No swelling or tenderness. Normal range of motion.  ?Skin: ?   General: Skin is warm and dry.  ?   Findings: No rash.  ?Neurological:  ?   Mental Status: She is alert and oriented to person, place, and time.  ?   Coordination: Coordination normal.  ?Psychiatric:     ?   Behavior: Behavior normal.  ? ? ? ? ?Assessment & Plan:  ? ?Problem List Items Addressed This Visit   ? ?  ? Cardiovascular and Mediastinum  ? Essential hypertension  ? Relevant Medications  ? furosemide (LASIX) 20 MG tablet  ? lisinopril (ZESTRIL) 40 MG tablet  ? amLODipine (NORVASC) 10 MG tablet  ? Other Relevant Orders  ? CBC with Differential/Platelet  ? CMP14+EGFR  ? Lipid panel  ?  ? Digestive  ? GERD (gastroesophageal reflux disease)  ? Relevant Medications  ? omeprazole (PRILOSEC) 20 MG capsule  ?  ? Other  ? Hyperlipidemia - Primary  ? Relevant Medications  ? furosemide (LASIX) 20 MG tablet  ? lisinopril (ZESTRIL) 40 MG tablet  ? amLODipine (NORVASC) 10 MG tablet  ? Other Relevant Orders  ? CBC with Differential/Platelet  ? CMP14+EGFR  ? Lipid panel  ? Gout  ? Relevant Medications  ? allopurinol (ZYLOPRIM) 300 MG tablet  ? ?Other Visit Diagnoses   ? ? Swelling      ? Relevant Medications  ? furosemide  (LASIX) 20 MG tablet  ? ?  ?  ?BP up, will increase blood pressure medicine From 5 mg amlodipine to 10 mg amlodipine.  We will see how she does with that and keep a close eye on it. ? ?Other medicine looks good, will recheck labs ?Follow up plan: ?Return in about 6 months (around 02/17/2022), or if symptoms worsen or fail to improve, for Hyperlipidemia and hypertension and GERD and gout recheck. ? ?Counseling provided for all of the vaccine components ?  No orders of the defined types were placed in this encounter. ? ? ?Caryl Pina, MD ?[place] ?08/17/2021, 9:57 AM ? ? ? ? ?

## 2021-08-18 LAB — CBC WITH DIFFERENTIAL/PLATELET
Basophils Absolute: 0 10*3/uL (ref 0.0–0.2)
Basos: 1 %
EOS (ABSOLUTE): 0.1 10*3/uL (ref 0.0–0.4)
Eos: 1 %
Hematocrit: 44.2 % (ref 34.0–46.6)
Hemoglobin: 15.1 g/dL (ref 11.1–15.9)
Immature Grans (Abs): 0 10*3/uL (ref 0.0–0.1)
Immature Granulocytes: 0 %
Lymphocytes Absolute: 1.9 10*3/uL (ref 0.7–3.1)
Lymphs: 35 %
MCH: 30.7 pg (ref 26.6–33.0)
MCHC: 34.2 g/dL (ref 31.5–35.7)
MCV: 90 fL (ref 79–97)
Monocytes Absolute: 0.5 10*3/uL (ref 0.1–0.9)
Monocytes: 10 %
Neutrophils Absolute: 2.8 10*3/uL (ref 1.4–7.0)
Neutrophils: 53 %
Platelets: 317 10*3/uL (ref 150–450)
RBC: 4.92 x10E6/uL (ref 3.77–5.28)
RDW: 14.3 % (ref 11.7–15.4)
WBC: 5.3 10*3/uL (ref 3.4–10.8)

## 2021-08-18 LAB — CMP14+EGFR
ALT: 33 IU/L — ABNORMAL HIGH (ref 0–32)
AST: 38 IU/L (ref 0–40)
Albumin/Globulin Ratio: 1.4 (ref 1.2–2.2)
Albumin: 4.7 g/dL — ABNORMAL HIGH (ref 3.6–4.6)
Alkaline Phosphatase: 76 IU/L (ref 44–121)
BUN/Creatinine Ratio: 13 (ref 12–28)
BUN: 11 mg/dL (ref 8–27)
Bilirubin Total: 0.3 mg/dL (ref 0.0–1.2)
CO2: 23 mmol/L (ref 20–29)
Calcium: 10.8 mg/dL — ABNORMAL HIGH (ref 8.7–10.3)
Chloride: 103 mmol/L (ref 96–106)
Creatinine, Ser: 0.85 mg/dL (ref 0.57–1.00)
Globulin, Total: 3.3 g/dL (ref 1.5–4.5)
Glucose: 100 mg/dL — ABNORMAL HIGH (ref 70–99)
Potassium: 4.1 mmol/L (ref 3.5–5.2)
Sodium: 141 mmol/L (ref 134–144)
Total Protein: 8 g/dL (ref 6.0–8.5)
eGFR: 69 mL/min/{1.73_m2} (ref >59)

## 2021-08-18 LAB — LIPID PANEL
Chol/HDL Ratio: 4.6 ratio — ABNORMAL HIGH (ref 0.0–4.4)
Cholesterol, Total: 219 mg/dL — ABNORMAL HIGH (ref 100–199)
HDL: 48 mg/dL (ref >39)
LDL Chol Calc (NIH): 121 mg/dL — ABNORMAL HIGH (ref 0–99)
Triglycerides: 284 mg/dL — ABNORMAL HIGH (ref 0–149)
VLDL Cholesterol Cal: 50 mg/dL — ABNORMAL HIGH (ref 5–40)

## 2021-09-07 ENCOUNTER — Ambulatory Visit (INDEPENDENT_AMBULATORY_CARE_PROVIDER_SITE_OTHER): Payer: Medicare Other

## 2021-09-07 VITALS — Wt 193.0 lb

## 2021-09-07 DIAGNOSIS — Z0000 Encounter for general adult medical examination without abnormal findings: Secondary | ICD-10-CM

## 2021-09-07 NOTE — Progress Notes (Signed)
? ?Subjective:  ? Caitlyn Boone is a 81 y.o. female who presents for Medicare Annual (Subsequent) preventive examination. ? ?Virtual Visit via Telephone Note ? ?I connected with  Caitlyn Boone on 69/48/54 at  9:00 AM EDT by telephone and verified that I am speaking with the correct person using two identifiers. ? ?Location: ?Patient: Home ?Provider: [facility] ?Persons participating in the virtual visit: patient/Nurse Health Advisor ?  ?I discussed the limitations, risks, security and privacy concerns of performing an evaluation and management service by telephone and the availability of in person appointments. The patient expressed understanding and agreed to proceed. ? ?Interactive audio and video telecommunications were attempted between this nurse and patient, however failed, due to patient having technical difficulties OR patient did not have access to video capability.  We continued and completed visit with audio only. ? ?Some vital signs may be absent or patient reported.  ? ?Bubber Rothert Dionne Ano, LPN  ? ?Review of Systems    ? ?Cardiac Risk Factors include: advanced age (>83mn, >>29women);dyslipidemia;hypertension;sedentary lifestyle;obesity (BMI >30kg/m2) ? ?   ?Objective:  ?  ?Today's Vitals  ? 09/07/21 0627004/03/23 [id number]  ?Weight: 193 lb (87.5 kg)   ?PainSc:  3   ? ?Body mass index is 33.13 kg/m?. ? ? ?  09/07/2021  ?  9:14 AM 09/03/2020  ?  9:00 AM 09/03/2019  ?  8:50 AM 12/28/2017  ? 11:05 AM 06/26/2014  ? 10:05 AM 05/21/2014  ?  7:20 PM  ?Advanced Directives  ?Does Patient Have a Medical Advance Directive? Yes No Yes Yes Yes No  ?Type of AMaterials engineerof [place] will   ?Does patient want to make changes to medical advance directive?    No - Patient declined No - Patient declined   ?Copy of [place] Chart? No - copy requested  No - copy requested Yes Yes   ?Would patient like  information on creating a medical advance directive?  No - Patient declined  No - Patient declined    ? ? ?Current Medications (verified) ?Outpatient Encounter Medications as of 09/07/2021  ?Medication Sig  ? allopurinol (ZYLOPRIM) 300 MG tablet Take 1 tablet (300 mg total) by mouth daily.  ? amLODipine (NORVASC) 10 MG tablet Take 1 tablet (10 mg total) by mouth daily. Cancel out 5 mg tablets, changed to 10 mg  ? aspirin EC 81 MG tablet Take 81 mg by mouth daily.  ? Calcium Citrate 1040 MG TABS Take by mouth.  ? Cholecalciferol (VITAMIN D) 2000 UNITS CAPS Take 1 capsule by mouth daily.  ? fluocinolone (SYNALAR) 0.01 % external solution Apply two or three drops in the ear canal as needed for itching  ? furosemide (LASIX) 20 MG tablet Take 1 tablet (20 mg total) by mouth daily as needed (for swelling present in the morning).  ? lisinopril (ZESTRIL) 40 MG tablet Take 1 tablet (40 mg total) by mouth daily.  ? loratadine (CLARITIN) 10 MG tablet Take 10 mg by mouth daily.  ? meclizine (ANTIVERT) 25 MG tablet Take 1 tablet (25 mg total) by mouth 3 (three) times daily as needed for dizziness.  ? Multiple Vitamin (MULTIVITAMIN WITH MINERALS) TABS tablet Take 1 tablet by mouth daily.  ? Omega-3 Fatty Acids (FISH OIL) 1000 MG CAPS Take 1 capsule by mouth.  ? omeprazole (PRILOSEC) 20 MG capsule Take 1 capsule (20 mg total) by mouth  daily.  ? pravastatin (PRAVACHOL) 80 MG tablet Take 1 tablet (80 mg total) by mouth every evening.  ? fluticasone (FLONASE) 50 MCG/ACT nasal spray Place 2 sprays into both nostrils as needed. (Patient not taking: Reported on 09/07/2021)  ? [DISCONTINUED] benzonatate (TESSALON) 100 MG capsule Take 1 capsule (100 mg total) by mouth 2 (two) times daily as needed for cough. (Patient not taking: Reported on 09/07/2021)  ? ?No facility-administered encounter medications on file as of 09/07/2021.  ? ? ?Allergies (verified) ?Patient has no known allergies.  ? ?History: ?Past Medical History:  ?Diagnosis Date  ?  Compression fracture 05/24/2013  ? L1  ? GERD (gastroesophageal reflux disease)   ? Gout   ? Hyperlipidemia   ? Hypertension   ? Knee fracture, left feb 2015  ? no surgery done  ? Osteoporosis   ? ?Past Surgical History:  ?Procedure Laterality Date  ? ABDOMINAL HYSTERECTOMY  1992  ? complete  ? CHOLECYSTECTOMY N/A 06/28/2014  ? Procedure: LAPAROSCOPIC CHOLECYSTECTOMY ;  Surgeon: Fanny Skates, MD;  Location: [facility];  Service: General;  Laterality: N/A;  ? EYE SURGERY Bilateral 2013  ? lens for cataracts  ? LAMINECTOMY  1987  ? L5-S1  ? TONSILLECTOMY  1961  ? ?Family History  ?Problem Relation Age of Onset  ? Heart disease Mother   ? Cancer Father   ?     smoker  ? Cancer Brother   ?     lung and colon  ? Colon cancer Brother   ? Hip fracture Brother   ? Dementia Brother 9  ? COPD Brother   ? Parkinson's disease Brother   ? ?Social History  ? ?Socioeconomic History  ? Marital status: Married  ?  Spouse name: Not on file  ? Number of children: 0  ? Years of education: 25  ? Highest education level: 12th grade  ?Occupational History  ? Occupation: Retired  ?Tobacco Use  ? Smoking status: Former  ?  Types: Cigarettes  ?  Quit date: 02/24/1979  ?  Years since quitting: 42.5  ? Smokeless tobacco: Never  ? Tobacco comments:  ?  smoked 2-3 cig a day when smoking, quit long ago  ?Vaping Use  ? Vaping Use: Never used  ?Substance and Sexual Activity  ? Alcohol use: No  ?  Alcohol/week: 0.0 standard drinks  ? Drug use: No  ? Sexual activity: Not Currently  ?Other Topics Concern  ? Not on file  ?Social History Narrative  ? Has 3 step-children - one passed away  ? Ex son-in-law lives nearby and checks on her often  ? ?Social Determinants of Health  ? ?Financial Resource Strain: Low Risk   ? Difficulty of Paying Living Expenses: Not hard at all  ?Food Insecurity: No Food Insecurity  ? Worried About Charity fundraiser in the Last Year: Never true  ? Ran Out of Food in the Last Year: Never true  ?Transportation Needs: No  Transportation Needs  ? Lack of Transportation (Medical): No  ? Lack of Transportation (Non-Medical): No  ?Physical Activity: Insufficiently Active  ? Days of Exercise per Week: 7 days  ? Minutes of Exercise per Session: 10 min  ?Stress: No Stress Concern Present  ? Feeling of Stress : Not at all  ?Social Connections: Socially Integrated  ? Frequency of Communication with Friends and Family: More than three times a week  ? Frequency of Social Gatherings with Friends and Family: More than three times a week  ?  Attends Religious Services: More than 4 times per year  ? Active Member of Clubs or Organizations: Yes  ? Attends Archivist Meetings: More than 4 times per year  ? Marital Status: Married  ? ? ?Tobacco Counseling ?Counseling given: Not Answered ?Tobacco comments: smoked 2-3 cig a day when smoking, quit long ago ? ? ?Clinical Intake: ? ?Pre-visit preparation completed: Yes ? ?Pain : 0-10 ?Pain Score: 3  ?Pain Type: Chronic pain ?Pain Location: Foot ?Pain Orientation: Left ?Pain Descriptors / Indicators: Aching, Discomfort ?Pain Onset: More than a month ago ?Pain Frequency: Intermittent ? ?  ? ?BMI - recorded: 33.13 ?Nutritional Status: BMI > 30  Obese ?Nutritional Risks: None ?Diabetes: No ? ?How often do you need to have someone help you when you read instructions, pamphlets, or other written materials from your doctor or pharmacy?: 1 - Never ? ?Diabetic? no ? ?Interpreter Needed?: No ? ?Information entered by :: Terrick Allred, LPN ? ? ?Activities of Daily Living ? ?  09/07/2021  ?  9:14 AM  ?In your present state of health, do you have any difficulty performing the following activities:  ?Hearing? 0  ?Vision? 0  ?Difficulty concentrating or making decisions? 0  ?Walking or climbing stairs? 1  ?Dressing or bathing? 0  ?Doing errands, shopping? 1  ?Preparing Food and eating ? N  ?Using the Toilet? N  ?In the past six months, have you accidently leaked urine? Y  ?Comment wears pads for protection  ?Do you  have problems with loss of bowel control? N  ?Managing your Medications? N  ?Managing your Finances? N  ?Housekeeping or managing your Housekeeping? N  ? ? ?Patient Care Team: ?Dettinger, Fransisca Kaufmann, MD as PCP - General (F

## 2021-09-07 NOTE — Patient Instructions (Addendum)
Caitlyn Boone , ?Thank you for taking time to come for your Medicare Wellness Visit. I appreciate your ongoing commitment to your health goals. Please review the following plan we discussed and let me know if I can assist you in the future.  ? ?Screening recommendations/referrals: ?Colonoscopy: Done 03/09/2016 - no repeat required ?Mammogram: Done 01/09/2021 - Repeat annually ?Bone Density: Done 04/02/2020 - Repeat every 2 years ?Recommended yearly ophthalmology/optometry visit for glaucoma screening and checkup ?Recommended yearly dental visit for hygiene and checkup ? ?Vaccinations: ?Influenza vaccine: Done 03/23/2021 - Repeat annually ?Pneumococcal vaccine: Done10/24/2007 & 02/19/2014 ?Tdap vaccine: Done 04/02/2019 - Repeat in 10 years ?Shingles vaccine: Done  08/07/2019 & 11/15/2019    ?Covid-19:Done 06/19/2019, 07/20/2019, & 04/10/2020 ? ?Advanced directives: Please bring a copy of your health care power of attorney and living will to the office to be added to your chart at your convenience.  ? ?Conditions/risks identified: Aim for 30 minutes of exercise or brisk walking, 6-8 glasses of water, and 5 servings of fruits and vegetables each day.  ? ?Next appointment: Follow up in one year for your annual wellness visit  ? ? ?Preventive Care 31 Years and Older, Female ?Preventive care refers to lifestyle choices and visits with your health care provider that can promote health and wellness. ?What does preventive care include? ?A yearly physical exam. This is also called an annual well check. ?Dental exams once or twice a year. ?Routine eye exams. Ask your health care provider how often you should have your eyes checked. ?Personal lifestyle choices, including: ?Daily care of your teeth and gums. ?Regular physical activity. ?Eating a healthy diet. ?Avoiding tobacco and drug use. ?Limiting alcohol use. ?Practicing safe sex. ?Taking low-dose aspirin every day. ?Taking vitamin and mineral supplements as recommended by your health  care provider. ?What happens during an annual well check? ?The services and screenings done by your health care provider during your annual well check will depend on your age, overall health, lifestyle risk factors, and family history of disease. ?Counseling  ?Your health care provider may ask you questions about your: ?Alcohol use. ?Tobacco use. ?Drug use. ?Emotional well-being. ?Home and relationship well-being. ?Sexual activity. ?Eating habits. ?History of falls. ?Memory and ability to understand (cognition). ?Work and work Statistician. ?Reproductive health. ?Screening  ?You may have the following tests or measurements: ?Height, weight, and BMI. ?Blood pressure. ?Lipid and cholesterol levels. These may be checked every 5 years, or more frequently if you are over 69 years old. ?Skin check. ?Lung cancer screening. You may have this screening every year starting at age 82 if you have a 30-pack-year history of smoking and currently smoke or have quit within the past 15 years. ?Fecal occult blood test (FOBT) of the stool. You may have this test every year starting at age 82. ?Flexible sigmoidoscopy or colonoscopy. You may have a sigmoidoscopy every 5 years or a colonoscopy every 10 years starting at age 82. ?Hepatitis C blood test. ?Hepatitis B blood test. ?Sexually transmitted disease (STD) testing. ?Diabetes screening. This is done by checking your blood sugar (glucose) after you have not eaten for a while (fasting). You may have this done every 1-3 years. ?Bone density scan. This is done to screen for osteoporosis. You may have this done starting at age 19. ?Mammogram. This may be done every 1-2 years. Talk to your health care provider about how often you should have regular mammograms. ?Talk with your health care provider about your test results, treatment options, and if necessary, the  need for more tests. ?Vaccines  ?Your health care provider may recommend certain vaccines, such as: ?Influenza vaccine. This is  recommended every year. ?Tetanus, diphtheria, and acellular pertussis (Tdap, Td) vaccine. You may need a Td booster every 10 years. ?Zoster vaccine. You may need this after age 8. ?Pneumococcal 13-valent conjugate (PCV13) vaccine. One dose is recommended after age 37. ?Pneumococcal polysaccharide (PPSV23) vaccine. One dose is recommended after age 36. ?Talk to your health care provider about which screenings and vaccines you need and how often you need them. ?This information is not intended to replace advice given to you by your health care provider. Make sure you discuss any questions you have with your health care provider. ?Document Released: 06/20/2015 Document Revised: 02/11/2016 Document Reviewed: 03/25/2015 ?Elsevier Interactive Patient Education ? 2017 [place]. ? ?Fall Prevention in the Home ?Falls can cause injuries. They can happen to people of all ages. There are many things you can do to make your home safe and to help prevent falls. ?What can I do on the outside of my home? ?Regularly fix the edges of walkways and driveways and fix any cracks. ?Remove anything that might make you trip as you walk through a door, such as a raised step or threshold. ?Trim any bushes or trees on the path to your home. ?Use bright outdoor lighting. ?Clear any walking paths of anything that might make someone trip, such as rocks or tools. ?Regularly check to see if handrails are loose or broken. Make sure that both sides of any steps have handrails. ?Any raised decks and porches should have guardrails on the edges. ?Have any leaves, snow, or ice cleared regularly. ?Use sand or salt on walking paths during winter. ?Clean up any spills in your garage right away. This includes oil or grease spills. ?What can I do in the bathroom? ?Use night lights. ?Install grab bars by the toilet and in the tub and shower. Do not use towel bars as grab bars. ?Use non-skid mats or decals in the tub or shower. ?If you need to sit down in  the shower, use a plastic, non-slip stool. ?Keep the floor dry. Clean up any water that spills on the floor as soon as it happens. ?Remove soap buildup in the tub or shower regularly. ?Attach bath mats securely with double-sided non-slip rug tape. ?Do not have throw rugs and other things on the floor that can make you trip. ?What can I do in the bedroom? ?Use night lights. ?Make sure that you have a light by your bed that is easy to reach. ?Do not use any sheets or blankets that are too big for your bed. They should not hang down onto the floor. ?Have a firm chair that has side arms. You can use this for support while you get dressed. ?Do not have throw rugs and other things on the floor that can make you trip. ?What can I do in the kitchen? ?Clean up any spills right away. ?Avoid walking on wet floors. ?Keep items that you use a lot in easy-to-reach places. ?If you need to reach something above you, use a strong step stool that has a grab bar. ?Keep electrical cords out of the way. ?Do not use floor polish or wax that makes floors slippery. If you must use wax, use non-skid floor wax. ?Do not have throw rugs and other things on the floor that can make you trip. ?What can I do with my stairs? ?Do not leave any items on the  stairs. ?Make sure that there are handrails on both sides of the stairs and use them. Fix handrails that are broken or loose. Make sure that handrails are as long as the stairways. ?Check any carpeting to make sure that it is firmly attached to the stairs. Fix any carpet that is loose or worn. ?Avoid having throw rugs at the top or bottom of the stairs. If you do have throw rugs, attach them to the floor with carpet tape. ?Make sure that you have a light switch at the top of the stairs and the bottom of the stairs. If you do not have them, ask someone to add them for you. ?What else can I do to help prevent falls? ?Wear shoes that: ?Do not have high heels. ?Have rubber bottoms. ?Are comfortable  and fit you well. ?Are closed at the toe. Do not wear sandals. ?If you use a stepladder: ?Make sure that it is fully opened. Do not climb a closed stepladder. ?Make sure that both sides of the stepladder ar

## 2021-10-18 IMAGING — DX DG RIBS W/ CHEST 3+V*R*
5 series · 5 of 5 positions shown · non-contrast
Comparison: March 08, 2018.

CLINICAL DATA: Right chest wall pain after fall.

EXAM:
RIGHT RIBS AND CHEST - 3+ VIEW

[chest pa]
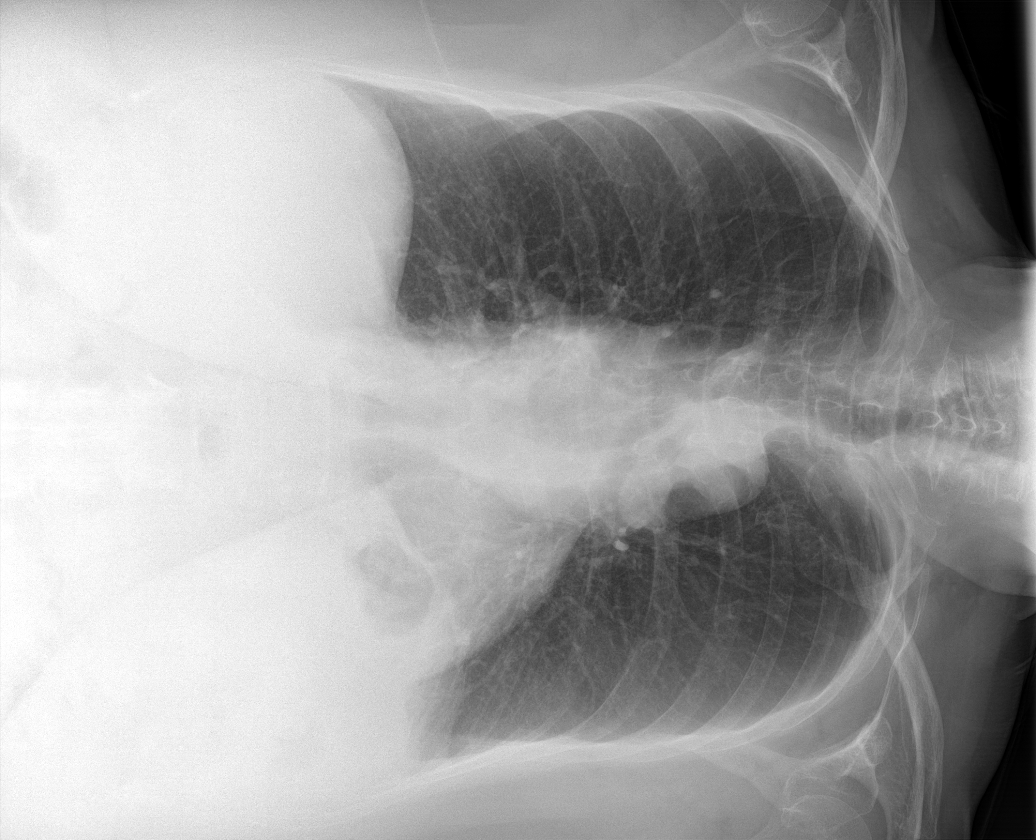

[rib obl (1 of 2)]
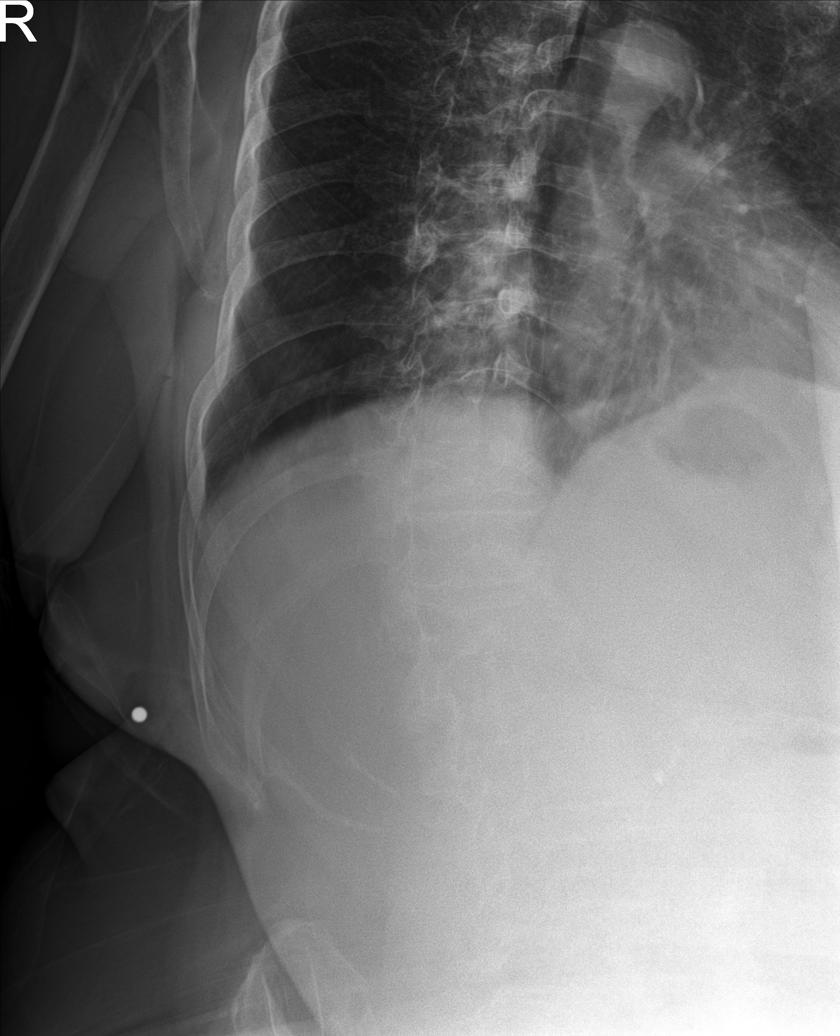

[rib obl (2 of 2)]
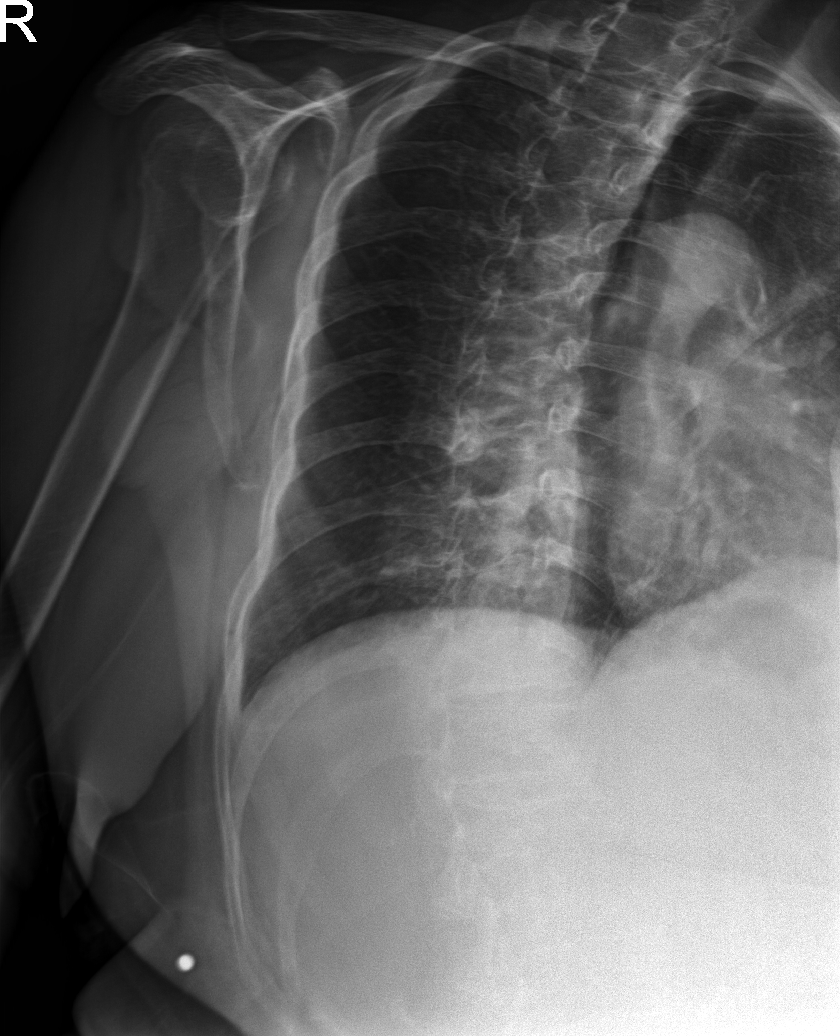

[rib ap (1 of 2)]
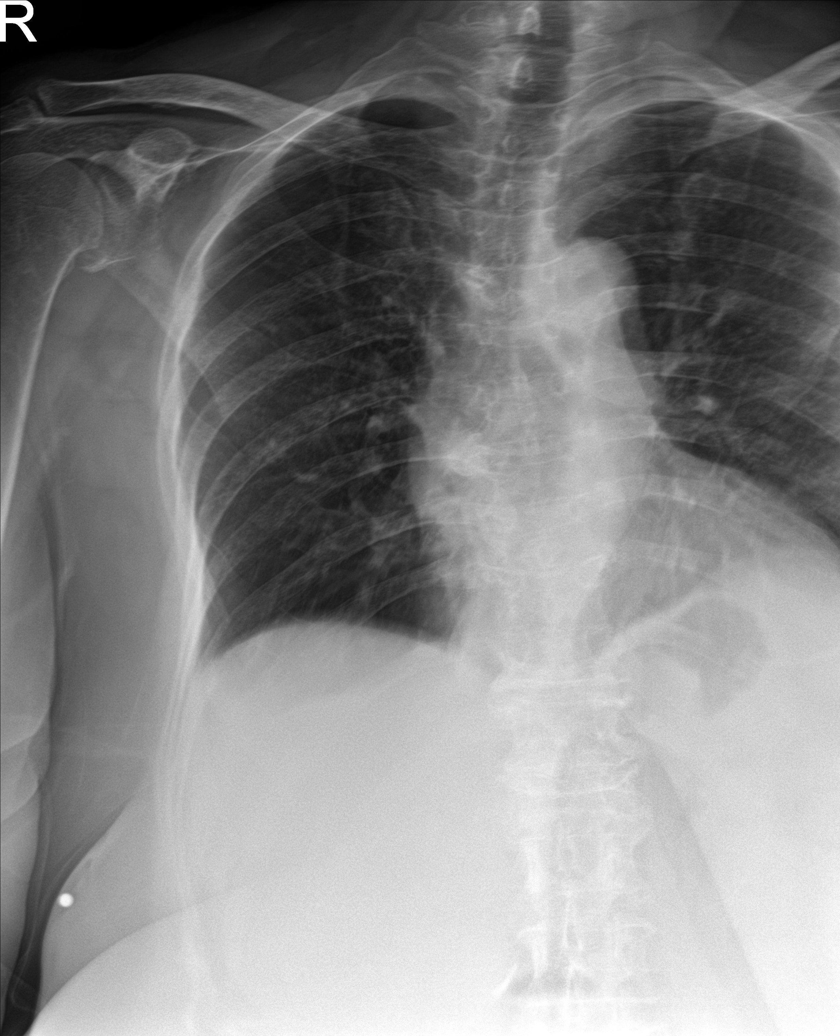

[rib ap (2 of 2)]
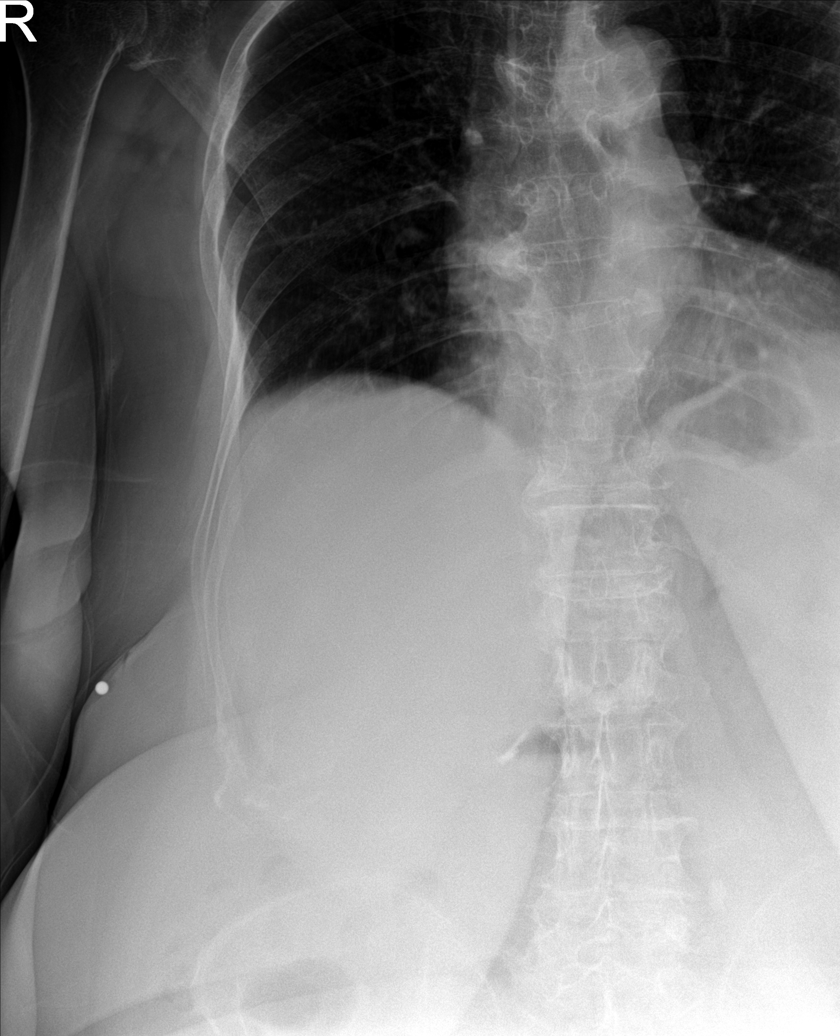

[5 of 5 positions shown; findings below may reference images not displayed]

FINDINGS: No fracture or other bone lesions are seen involving the ribs. There
is no evidence of pneumothorax or pleural effusion. Both lungs are
clear. Heart size and mediastinal contours are within normal limits.
IMPRESSION: Negative.

## 2021-12-03 ENCOUNTER — Ambulatory Visit (INDEPENDENT_AMBULATORY_CARE_PROVIDER_SITE_OTHER): Payer: Medicare Other | Admitting: Nurse Practitioner

## 2021-12-03 ENCOUNTER — Encounter: Payer: Self-pay | Admitting: Nurse Practitioner

## 2021-12-03 VITALS — BP 155/79 | HR 70 | Temp 97.9°F | Resp 20 | Ht 64.0 in | Wt 195.0 lb

## 2021-12-03 DIAGNOSIS — M545 Low back pain, unspecified: Secondary | ICD-10-CM

## 2021-12-03 MED ORDER — METHYLPREDNISOLONE ACETATE 80 MG/ML IJ SUSP: 80.0000 mg | Freq: Once | INTRAMUSCULAR | Status: DC

## 2021-12-03 MED ORDER — KETOROLAC TROMETHAMINE 60 MG/2ML IM SOLN
60.0000 mg | Freq: Once | INTRAMUSCULAR | Status: AC
  Administered 2021-12-03: 60 mg via INTRAMUSCULAR

## 2021-12-03 MED ORDER — METHYLPREDNISOLONE ACETATE 40 MG/ML IJ SUSP
40.0000 mg | Freq: Once | INTRAMUSCULAR | Status: AC
  Administered 2021-12-03: 40 mg via INTRAMUSCULAR

## 2021-12-03 NOTE — Progress Notes (Signed)
   Subjective:    Patient ID: Caitlyn Boone, female    DOB: 17-Sep-1940, 81 y.o.   MRN: [id number]   Chief Complaint: Back Pain (Low back)   Back Pain This is a new problem. The problem occurs constantly. The problem has been waxing and waning since onset. The pain is present in the lumbar spine. The quality of the pain is described as aching and stabbing. Radiates to: radiates around side. The pain is at a severity of 7/10. The pain is moderate. The pain is Worse during the day. The symptoms are aggravated by bending. Stiffness is present In the morning. Pertinent negatives include no abdominal pain, chest pain, headaches or weakness. Treatments tried: horse linament. The treatment provided mild relief.       Review of Systems  Constitutional:  Negative for diaphoresis.  Eyes:  Negative for pain.  Respiratory:  Negative for shortness of breath.   Cardiovascular:  Negative for chest pain, palpitations and leg swelling.  Gastrointestinal:  Negative for abdominal pain.  Endocrine: Negative for polydipsia.  Musculoskeletal:  Positive for back pain.  Skin:  Negative for rash.  Neurological:  Negative for dizziness, weakness and headaches.  Hematological:  Does not bruise/bleed easily.  All other systems reviewed and are negative.      Objective:   Physical Exam Constitutional:      Appearance: Normal appearance.  Cardiovascular:     Rate and Rhythm: Normal rate and regular rhythm.     Heart sounds: Normal heart sounds.  Pulmonary:     Breath sounds: Normal breath sounds.  Musculoskeletal:     Comments: ROM of lumbar spine with pain on flexion and rotation to the right (-) SLR nbil Motor strength and sensation distally intact  Skin:    General: Skin is warm.  Neurological:     General: No focal deficit present.     Mental Status: She is alert and oriented to person, place, and time.  Psychiatric:        Mood and Affect: Mood normal.        Behavior: Behavior normal.    BP  (!) 155/79   Pulse 70   Temp 97.9 F (36.6 C) (Temporal)   Resp 20   Ht '5\' 4"'$  (1.626 m)   Wt 195 lb (88.5 kg)   SpO2 97%   BMI 33.47 kg/m         Assessment & Plan:   Caitlyn Boone in today with chief complaint of Back Pain (Low back)   1. Acute right-sided low back pain without sciatica Moist heat Rest  If hurting stop what doing RTO prn - methylPREDNISolone acetate (DEPO-MEDROL) injection 80 mg - ketorolac (TORADOL) injection 60 mg    The above assessment and management plan was discussed with the patient. The patient verbalized understanding of and has agreed to the management plan. Patient is aware to call the clinic if symptoms persist or worsen. Patient is aware when to return to the clinic for a follow-up visit. Patient educated on when it is appropriate to go to the emergency department.   Mary-Margaret Hassell Done, FNP

## 2021-12-03 NOTE — Patient Instructions (Signed)
Acute Back Pain, Adult Acute back pain is sudden and usually short-lived. It is often caused by an injury to the muscles and tissues in the back. The injury may result from: A muscle, tendon, or ligament getting overstretched or torn. Ligaments are tissues that connect bones to each other. Lifting something improperly can cause a back strain. Wear and tear (degeneration) of the spinal disks. Spinal disks are circular tissue that provide cushioning between the bones of the spine (vertebrae). Twisting motions, such as while playing sports or doing yard work. A hit to the back. Arthritis. You may have a physical exam, lab tests, and imaging tests to find the cause of your pain. Acute back pain usually goes away with rest and home care. Follow these instructions at home: Managing pain, stiffness, and swelling Take over-the-counter and prescription medicines only as told by your health care provider. Treatment may include medicines for pain and inflammation that are taken by mouth or applied to the skin, or muscle relaxants. Your health care provider may recommend applying ice during the first 24-48 hours after your pain starts. To do this: Put ice in a plastic bag. Place a towel between your skin and the bag. Leave the ice on for 20 minutes, 2-3 times a day. Remove the ice if your skin turns bright red. This is very important. If you cannot feel pain, heat, or cold, you have a greater risk of damage to the area. If directed, apply heat to the affected area as often as told by your health care provider. Use the heat source that your health care provider recommends, such as a moist heat pack or a heating pad. Place a towel between your skin and the heat source. Leave the heat on for 20-30 minutes. Remove the heat if your skin turns bright red. This is especially important if you are unable to feel pain, heat, or cold. You have a greater risk of getting burned. Activity  Do not stay in bed. Staying in  bed for more than 1-2 days can delay your recovery. Sit up and stand up straight. Avoid leaning forward when you sit or hunching over when you stand. If you work at a desk, sit close to it so you do not need to lean over. Keep your chin tucked in. Keep your neck drawn back, and keep your elbows bent at a 90-degree angle (right angle). Sit high and close to the steering wheel when you drive. Add lower back (lumbar) support to your car seat, if needed. Take short walks on even surfaces as soon as you are able. Try to increase the length of time you walk each day. Do not sit, drive, or stand in one place for more than 30 minutes at a time. Sitting or standing for long periods of time can put stress on your back. Do not drive or use heavy machinery while taking prescription pain medicine. Use proper lifting techniques. When you bend and lift, use positions that put less stress on your back: Bend your knees. Keep the load close to your body. Avoid twisting. Exercise regularly as told by your health care provider. Exercising helps your back heal faster and helps prevent back injuries by keeping muscles strong and flexible. Work with a physical therapist to make a safe exercise program, as recommended by your health care provider. Do any exercises as told by your physical therapist. Lifestyle Maintain a healthy weight. Extra weight puts stress on your back and makes it difficult to have good   posture. Avoid activities or situations that make you feel anxious or stressed. Stress and anxiety increase muscle tension and can make back pain worse. Learn ways to manage anxiety and stress, such as through exercise. General instructions Sleep on a firm mattress in a comfortable position. Try lying on your side with your knees slightly bent. If you lie on your back, put a pillow under your knees. Keep your head and neck in a straight line with your spine (neutral position) when using electronic equipment like  smartphones or pads. To do this: Raise your smartphone or pad to look at it instead of bending your head or neck to look down. Put the smartphone or pad at the level of your face while looking at the screen. Follow your treatment plan as told by your health care provider. This may include: Cognitive or behavioral therapy. Acupuncture or massage therapy. Meditation or yoga. Contact a health care provider if: You have pain that is not relieved with rest or medicine. You have increasing pain going down into your legs or buttocks. Your pain does not improve after 2 weeks. You have pain at night. You lose weight without trying. You have a fever or chills. You develop nausea or vomiting. You develop abdominal pain. Get help right away if: You develop new bowel or bladder control problems. You have unusual weakness or numbness in your arms or legs. You feel faint. These symptoms may represent a serious problem that is an emergency. Do not wait to see if the symptoms will go away. Get medical help right away. Call your local emergency services (911 in the [place] Do not drive yourself to the hospital. Summary Acute back pain is sudden and usually short-lived. Use proper lifting techniques. When you bend and lift, use positions that put less stress on your back. Take over-the-counter and prescription medicines only as told by your health care provider, and apply heat or ice as told. This information is not intended to replace advice given to you by your health care provider. Make sure you discuss any questions you have with your health care provider. Document Revised: 08/15/2020 Document Reviewed: 08/15/2020 Elsevier Patient Education  2023 [employer] Inc.  

## 2021-12-15 ENCOUNTER — Other Ambulatory Visit: Payer: Self-pay | Admitting: Family Medicine

## 2021-12-15 DIAGNOSIS — Z1231 Encounter for screening mammogram for malignant neoplasm of breast: Secondary | ICD-10-CM

## 2021-12-24 DIAGNOSIS — H31092 Other chorioretinal scars, left eye: Secondary | ICD-10-CM | POA: Diagnosis not present

## 2021-12-24 DIAGNOSIS — H34231 Retinal artery branch occlusion, right eye: Secondary | ICD-10-CM | POA: Diagnosis not present

## 2021-12-24 DIAGNOSIS — H26491 Other secondary cataract, right eye: Secondary | ICD-10-CM | POA: Diagnosis not present

## 2021-12-24 DIAGNOSIS — H40013 Open angle with borderline findings, low risk, bilateral: Secondary | ICD-10-CM | POA: Diagnosis not present

## 2022-01-11 ENCOUNTER — Ambulatory Visit
Admission: RE | Admit: 2022-01-11 | Discharge: 2022-01-11 | Disposition: A | Discharge status: Home / Self Care | Payer: Medicare Other | Source: Ambulatory Visit | Attending: Family Medicine | Admitting: Family Medicine

## 2022-01-11 DIAGNOSIS — Z1231 Encounter for screening mammogram for malignant neoplasm of breast: Secondary | ICD-10-CM

## 2022-01-14 ENCOUNTER — Ambulatory Visit: Payer: Self-pay | Admitting: *Deleted

## 2022-01-14 NOTE — Chronic Care Management (AMB) (Signed)
  Chronic Care Management   Note  5/45/6256 Name: Caitlyn Boone MRN: [id number] DOB: June 28, 1940   Due to changes in the Chronic Care Management program, I am removing myself as the [place] from the Care Team and closing any RN Care Management Care Plans. The patient has not worked with the Consulting civil engineer within the past 6 months. Patient was not scheduled to be followed by the RN Care Coordination nurse for [facility].   Patient does not have an open Care Plan with another CCM team member. Patient does not have a current CCM referral placed since 10/05/21. CCM enrollment status changed to "not enrolled".   Patient's PCP can place a new referral if the they needs Care Management or Care Coordination services in the future.  Chong Sicilian, BSN, RN-BC Proofreader Dial: [phone]

## 2022-01-14 NOTE — Patient Instructions (Signed)
Caitlyn Boone  At some point during the past 4 years, I have worked with you through the [facility] Management Program (CCM) at [facility]. We have not worked together within the past 6 months.   Due to program changes I am removing myself from your care team.   If you are currently active with another CCM Team Member, you will remain active with them unless they reach out to you with additional information.   If you feel that you need services in the future,  please talk with your primary care provider and request a new referral for Care Management or Care Coordination services. This does not affect your status as a patient at [facility].   Thank you for allowing me to participate in your your healthcare journey.  Chong Sicilian, BSN, RN-BC Proofreader Dial: [phone]

## 2022-02-17 ENCOUNTER — Ambulatory Visit (INDEPENDENT_AMBULATORY_CARE_PROVIDER_SITE_OTHER): Payer: Medicare Other | Admitting: Family Medicine

## 2022-02-17 ENCOUNTER — Encounter: Payer: Self-pay | Admitting: Family Medicine

## 2022-02-17 VITALS — BP 130/66 | HR 67 | Temp 97.9°F | Ht 64.0 in | Wt 195.0 lb

## 2022-02-17 DIAGNOSIS — E785 Hyperlipidemia, unspecified: Secondary | ICD-10-CM

## 2022-02-17 DIAGNOSIS — I10 Essential (primary) hypertension: Secondary | ICD-10-CM

## 2022-02-17 DIAGNOSIS — K219 Gastro-esophageal reflux disease without esophagitis: Secondary | ICD-10-CM

## 2022-02-17 MED ORDER — PRAVASTATIN SODIUM 80 MG PO TABS: 80.0000 mg | ORAL_TABLET | Freq: Every evening | ORAL | 3 refills | Status: DC

## 2022-02-17 NOTE — Progress Notes (Signed)
BP 130/66   Pulse 67   Temp 97.9 F (36.6 C)   Ht 5' 4"  (1.626 m)   Wt 195 lb (88.5 kg)   SpO2 97%   BMI 33.47 kg/m    Subjective:   Patient ID: Caitlyn Boone, female    DOB: 03-13-1941, 81 y.o.   MRN: [id number]  HPI: Caitlyn Boone is a 81 y.o. female presenting on 02/17/2022 for Medical Management of Chronic Issues, Hyperlipidemia, and Hypertension   HPI Hypertension Patient is currently on amlodipine and furosemide and lisinopril, and their blood pressure today is 130/66. Patient denies any lightheadedness or dizziness. Patient denies headaches, blurred vision, chest pains, shortness of breath, or weakness. Denies any side effects from medication and is content with current medication.   Hyperlipidemia Patient is coming in for recheck of his hyperlipidemia. The patient is currently taking fish oil and pravastatin. They deny any issues with myalgias or history of liver damage from it. They deny any focal numbness or weakness or chest pain.   GERD Patient is currently on omeprazole.  She denies any major symptoms or abdominal pain or belching or burping. She denies any blood in her stool or lightheadedness or dizziness.   Relevant past medical, surgical, family and social history reviewed and updated as indicated. Interim medical history since our last visit reviewed. Allergies and medications reviewed and updated.  Review of Systems  Constitutional:  Negative for chills and fever.  Eyes:  Negative for visual disturbance.  Respiratory:  Negative for chest tightness and shortness of breath.   Cardiovascular:  Negative for chest pain and leg swelling.  Musculoskeletal:  Positive for arthralgias. Negative for back pain and gait problem.  Skin:  Negative for rash.  Neurological:  Negative for dizziness, light-headedness and headaches.  Psychiatric/Behavioral:  Negative for agitation and behavioral problems.   All other systems reviewed and are negative.   Per HPI unless  specifically indicated above   Allergies as of 02/17/2022   No Known Allergies      Medication List        Accurate as of February 17, 2022  1:25 PM. If you have any questions, ask your nurse or doctor.          allopurinol 300 MG tablet Commonly known as: ZYLOPRIM Take 1 tablet (300 mg total) by mouth daily.   amLODipine 10 MG tablet Commonly known as: NORVASC Take 1 tablet (10 mg total) by mouth daily. Cancel out 5 mg tablets, changed to 10 mg   aspirin EC 81 MG tablet Take 81 mg by mouth daily.   Calcium Citrate 1040 MG Tabs Take by mouth.   Fish Oil 1000 MG Caps Take 1 capsule by mouth.   fluocinolone 0.01 % external solution Commonly known as: Synalar Apply two or three drops in the ear canal as needed for itching   fluticasone 50 MCG/ACT nasal spray Commonly known as: FLONASE Place 2 sprays into both nostrils as needed.   furosemide 20 MG tablet Commonly known as: LASIX Take 1 tablet (20 mg total) by mouth daily as needed (for swelling present in the morning).   lisinopril 40 MG tablet Commonly known as: ZESTRIL Take 1 tablet (40 mg total) by mouth daily.   loratadine 10 MG tablet Commonly known as: CLARITIN Take 10 mg by mouth daily.   meclizine 25 MG tablet Commonly known as: ANTIVERT Take 1 tablet (25 mg total) by mouth 3 (three) times daily as needed for dizziness.   multivitamin  with minerals Tabs tablet Take 1 tablet by mouth daily.   omeprazole 20 MG capsule Commonly known as: PRILOSEC Take 1 capsule (20 mg total) by mouth daily.   pravastatin 80 MG tablet Commonly known as: PRAVACHOL Take 1 tablet (80 mg total) by mouth every evening.   Vitamin D 50 MCG (2000 UT) Caps Take 1 capsule by mouth daily.         Objective:   BP 130/66   Pulse 67   Temp 97.9 F (36.6 C)   Ht 5' 4"  (1.626 m)   Wt 195 lb (88.5 kg)   SpO2 97%   BMI 33.47 kg/m   Wt Readings from Last 3 Encounters:  02/17/22 195 lb (88.5 kg)  12/03/21 195 lb  (88.5 kg)  09/07/21 193 lb (87.5 kg)    Physical Exam Vitals and nursing note reviewed.  Constitutional:      General: She is not in acute distress.    Appearance: She is well-developed. She is not diaphoretic.  Eyes:     Conjunctiva/sclera: Conjunctivae normal.  Cardiovascular:     Rate and Rhythm: Normal rate and regular rhythm.     Heart sounds: Normal heart sounds. No murmur heard. Pulmonary:     Effort: Pulmonary effort is normal. No respiratory distress.     Breath sounds: Normal breath sounds. No wheezing.  Abdominal:     General: Abdomen is flat. There is no distension.     Palpations: Abdomen is soft.     Tenderness: There is no abdominal tenderness. There is no guarding or rebound.  Musculoskeletal:        General: No tenderness. Normal range of motion.  Skin:    General: Skin is warm and dry.     Findings: No rash.  Neurological:     Mental Status: She is alert and oriented to person, place, and time.     Coordination: Coordination normal.  Psychiatric:        Behavior: Behavior normal.       Assessment & Plan:   Problem List Items Addressed This Visit       Cardiovascular and Mediastinum   Essential hypertension - Primary   Relevant Medications   pravastatin (PRAVACHOL) 80 MG tablet   Other Relevant Orders   CBC with Differential/Platelet   CMP14+EGFR   Lipid panel     Digestive   GERD (gastroesophageal reflux disease)     Other   Hyperlipidemia   Relevant Medications   pravastatin (PRAVACHOL) 80 MG tablet   Other Relevant Orders   CBC with Differential/Platelet   CMP14+EGFR   Lipid panel    Continue current medicine, no changes.  We will check blood work today.  Biggest thing is her cholesterol was off last time so we will see if it improved and she has been taking it more consistently now. Follow up plan: Return in about 6 months (around 08/18/2022), or if symptoms worsen or fail to improve, for Hypertension and cholesterol and  GERD.  Counseling provided for all of the vaccine components Orders Placed This Encounter  Procedures   CBC with Differential/Platelet   CMP14+EGFR   Lipid panel    Caryl Pina, MD [facility] 02/17/2022, 1:25 PM

## 2022-02-18 LAB — CBC WITH DIFFERENTIAL/PLATELET
Basophils Absolute: 0.1 10*3/uL (ref 0.0–0.2)
Basos: 1 %
EOS (ABSOLUTE): 0.1 10*3/uL (ref 0.0–0.4)
Eos: 1 %
Hematocrit: 43.5 % (ref 34.0–46.6)
Hemoglobin: 14.6 g/dL (ref 11.1–15.9)
Immature Grans (Abs): 0 10*3/uL (ref 0.0–0.1)
Immature Granulocytes: 0 %
Lymphocytes Absolute: 2.1 10*3/uL (ref 0.7–3.1)
Lymphs: 30 %
MCH: 30.9 pg (ref 26.6–33.0)
MCHC: 33.6 g/dL (ref 31.5–35.7)
MCV: 92 fL (ref 79–97)
Monocytes Absolute: 0.5 10*3/uL (ref 0.1–0.9)
Monocytes: 7 %
Neutrophils Absolute: 4.3 10*3/uL (ref 1.4–7.0)
Neutrophils: 61 %
Platelets: 336 10*3/uL (ref 150–450)
RBC: 4.72 x10E6/uL (ref 3.77–5.28)
RDW: 13.4 % (ref 11.7–15.4)
WBC: 7.1 10*3/uL (ref 3.4–10.8)

## 2022-02-18 LAB — LIPID PANEL
Chol/HDL Ratio: 3.8 ratio (ref 0.0–4.4)
Cholesterol, Total: 200 mg/dL — ABNORMAL HIGH (ref 100–199)
HDL: 52 mg/dL (ref >39)
LDL Chol Calc (NIH): 97 mg/dL (ref 0–99)
Triglycerides: 308 mg/dL — ABNORMAL HIGH (ref 0–149)
VLDL Cholesterol Cal: 51 mg/dL — ABNORMAL HIGH (ref 5–40)

## 2022-02-18 LAB — CMP14+EGFR
ALT: 35 IU/L — ABNORMAL HIGH (ref 0–32)
AST: 40 IU/L (ref 0–40)
Albumin/Globulin Ratio: 1.3 (ref 1.2–2.2)
Albumin: 4.4 g/dL (ref 3.7–4.7)
Alkaline Phosphatase: 75 IU/L (ref 44–121)
BUN/Creatinine Ratio: 19 (ref 12–28)
BUN: 16 mg/dL (ref 8–27)
Bilirubin Total: <0.2 mg/dL (ref 0.0–1.2)
CO2: 25 mmol/L (ref 20–29)
Calcium: 10.7 mg/dL — ABNORMAL HIGH (ref 8.7–10.3)
Chloride: 105 mmol/L (ref 96–106)
Creatinine, Ser: 0.85 mg/dL (ref 0.57–1.00)
Globulin, Total: 3.4 g/dL (ref 1.5–4.5)
Glucose: 127 mg/dL — ABNORMAL HIGH (ref 70–99)
Potassium: 4.6 mmol/L (ref 3.5–5.2)
Sodium: 143 mmol/L (ref 134–144)
Total Protein: 7.8 g/dL (ref 6.0–8.5)
eGFR: 68 mL/min/{1.73_m2} (ref >59)

## 2022-03-19 ENCOUNTER — Ambulatory Visit (INDEPENDENT_AMBULATORY_CARE_PROVIDER_SITE_OTHER): Payer: Medicare Other | Admitting: Family Medicine

## 2022-03-19 ENCOUNTER — Encounter: Payer: Self-pay | Admitting: Family Medicine

## 2022-03-19 VITALS — BP 141/74 | HR 89 | Temp 98.0°F | Ht 64.0 in | Wt 197.0 lb

## 2022-03-19 DIAGNOSIS — L820 Inflamed seborrheic keratosis: Secondary | ICD-10-CM

## 2022-03-19 DIAGNOSIS — Z23 Encounter for immunization: Secondary | ICD-10-CM | POA: Diagnosis not present

## 2022-03-19 NOTE — Progress Notes (Signed)
BP (!) 141/74   Pulse 89   Temp 98 F (36.7 C)   Ht '5\' 4"'$  (1.626 m)   Wt 197 lb (89.4 kg)   SpO2 92%   BMI 33.81 kg/m    Subjective:   Patient ID: Caitlyn Boone, female    DOB: Jan 08, 1941, 81 y.o.   MRN: [id number]  HPI: Caitlyn Boone is a 81 y.o. female presenting on 03/19/2022 for Seborrheic Keratosis (Right arm- would like removed)   HPI Patient is coming in for a lesion on her right posterior forearm just proximal to her elbow that is been there and sometimes it will fall off and bleed a little bit and then irritate her and she is ready to get it frozen off.  Relevant past medical, surgical, family and social history reviewed and updated as indicated. Interim medical history since our last visit reviewed. Allergies and medications reviewed and updated.  Review of Systems  Constitutional:  Negative for activity change and fever.  Skin:  Negative for color change and rash.    Per HPI unless specifically indicated above   Allergies as of 03/19/2022   No Known Allergies      Medication List        Accurate as of March 19, 2022  2:27 PM. If you have any questions, ask your nurse or doctor.          allopurinol 300 MG tablet Commonly known as: ZYLOPRIM Take 1 tablet (300 mg total) by mouth daily.   amLODipine 10 MG tablet Commonly known as: NORVASC Take 1 tablet (10 mg total) by mouth daily. Cancel out 5 mg tablets, changed to 10 mg   aspirin EC 81 MG tablet Take 81 mg by mouth daily.   Calcium Citrate 1040 MG Tabs Take by mouth.   Fish Oil 1000 MG Caps Take 1 capsule by mouth.   fluocinolone 0.01 % external solution Commonly known as: Synalar Apply two or three drops in the ear canal as needed for itching   fluticasone 50 MCG/ACT nasal spray Commonly known as: FLONASE Place 2 sprays into both nostrils as needed.   furosemide 20 MG tablet Commonly known as: LASIX Take 1 tablet (20 mg total) by mouth daily as needed (for swelling present in  the morning).   lisinopril 40 MG tablet Commonly known as: ZESTRIL Take 1 tablet (40 mg total) by mouth daily.   loratadine 10 MG tablet Commonly known as: CLARITIN Take 10 mg by mouth daily.   meclizine 25 MG tablet Commonly known as: ANTIVERT Take 1 tablet (25 mg total) by mouth 3 (three) times daily as needed for dizziness.   multivitamin with minerals Tabs tablet Take 1 tablet by mouth daily.   omeprazole 20 MG capsule Commonly known as: PRILOSEC Take 1 capsule (20 mg total) by mouth daily.   pravastatin 80 MG tablet Commonly known as: PRAVACHOL Take 1 tablet (80 mg total) by mouth every evening.   Vitamin D 50 MCG (2000 UT) Caps Take 1 capsule by mouth daily.         Objective:   BP (!) 141/74   Pulse 89   Temp 98 F (36.7 C)   Ht '5\' 4"'$  (1.626 m)   Wt 197 lb (89.4 kg)   SpO2 92%   BMI 33.81 kg/m   Wt Readings from Last 3 Encounters:  03/19/22 197 lb (89.4 kg)  02/17/22 195 lb (88.5 kg)  12/03/21 195 lb (88.5 kg)    Physical Exam  Vitals and nursing note reviewed.  Constitutional:      Appearance: Normal appearance.  Skin:    Findings: Lesion present.       Neurological:     Mental Status: She is alert.     Seborrheic keratosis cryotherapy: Used liquid nitrogen in 3-10-second bursts on wart.  Patient tolerated well, gave instructions on what to watch for blistering and keeping the wound site clean.   Assessment & Plan:   Problem List Items Addressed This Visit   None Visit Diagnoses     Inflamed seborrheic keratosis    -  Primary   Need for immunization against influenza       Relevant Orders   Flu Vaccine QUAD High Dose(Fluad) (Completed)        Follow up plan: Return for 3 to 4-week retreatment.  Counseling provided for all of the vaccine components Orders Placed This Encounter  Procedures   Flu Vaccine QUAD High Dose(Fluad)    Caryl Pina, MD [facility] 03/19/2022, 2:27 PM

## 2022-04-14 ENCOUNTER — Ambulatory Visit: Payer: Medicare Other | Admitting: Family Medicine

## 2022-06-10 ENCOUNTER — Encounter: Payer: Self-pay | Admitting: Family Medicine

## 2022-08-09 ENCOUNTER — Other Ambulatory Visit: Payer: Self-pay | Admitting: Family Medicine

## 2022-08-09 DIAGNOSIS — I10 Essential (primary) hypertension: Secondary | ICD-10-CM

## 2022-08-11 ENCOUNTER — Other Ambulatory Visit: Payer: Self-pay | Admitting: Family Medicine

## 2022-08-11 DIAGNOSIS — R609 Edema, unspecified: Secondary | ICD-10-CM

## 2022-08-18 ENCOUNTER — Ambulatory Visit (INDEPENDENT_AMBULATORY_CARE_PROVIDER_SITE_OTHER): Payer: Medicare Other | Admitting: Family Medicine

## 2022-08-18 ENCOUNTER — Encounter: Payer: Self-pay | Admitting: Family Medicine

## 2022-08-18 VITALS — BP 117/80 | HR 86 | Ht 64.0 in | Wt 196.0 lb

## 2022-08-18 DIAGNOSIS — I10 Essential (primary) hypertension: Secondary | ICD-10-CM | POA: Diagnosis not present

## 2022-08-18 DIAGNOSIS — R739 Hyperglycemia, unspecified: Secondary | ICD-10-CM

## 2022-08-18 DIAGNOSIS — E782 Mixed hyperlipidemia: Secondary | ICD-10-CM

## 2022-08-18 DIAGNOSIS — K219 Gastro-esophageal reflux disease without esophagitis: Secondary | ICD-10-CM

## 2022-08-18 DIAGNOSIS — M1A9XX0 Chronic gout, unspecified, without tophus (tophi): Secondary | ICD-10-CM | POA: Diagnosis not present

## 2022-08-18 DIAGNOSIS — R609 Edema, unspecified: Secondary | ICD-10-CM | POA: Diagnosis not present

## 2022-08-18 DIAGNOSIS — E781 Pure hyperglyceridemia: Secondary | ICD-10-CM | POA: Diagnosis not present

## 2022-08-18 LAB — BAYER DCA HB A1C WAIVED: HB A1C (BAYER DCA - WAIVED): 5.5 % (ref 4.8–5.6)

## 2022-08-18 MED ORDER — LISINOPRIL 40 MG PO TABS: 40.0000 mg | ORAL_TABLET | Freq: Every day | ORAL | 3 refills | Status: DC

## 2022-08-18 MED ORDER — AMLODIPINE BESYLATE 10 MG PO TABS: 10.0000 mg | ORAL_TABLET | Freq: Every day | ORAL | 3 refills | Status: DC

## 2022-08-18 MED ORDER — OMEPRAZOLE 20 MG PO CPDR: 20.0000 mg | DELAYED_RELEASE_CAPSULE | Freq: Every day | ORAL | 3 refills | Status: DC

## 2022-08-18 MED ORDER — ALLOPURINOL 300 MG PO TABS: 300.0000 mg | ORAL_TABLET | Freq: Every day | ORAL | 3 refills | Status: DC

## 2022-08-18 MED ORDER — FUROSEMIDE 20 MG PO TABS: 20.0000 mg | ORAL_TABLET | Freq: Every day | ORAL | 1 refills | Status: DC | PRN

## 2022-08-18 NOTE — Progress Notes (Signed)
BP 117/80   Pulse 86   Ht '5\' 4"'$  (1.626 m)   Wt 196 lb (88.9 kg)   SpO2 94%   BMI 33.64 kg/m    Subjective:   Patient ID: Caitlyn Boone, female    DOB: 08-17-1940, 82 y.o.   MRN: [id number]  HPI: Caitlyn Boone is a 82 y.o. female presenting on 08/18/2022 for Medical Management of Chronic Issues, Hyperlipidemia, Hypertension, and Gastroesophageal Reflux   HPI GERD Patient is currently on omeprazole.  She denies any major symptoms or abdominal pain or belching or burping. She denies any blood in her stool or lightheadedness or dizziness.   Hyperlipidemia Patient is coming in for recheck of his hyperlipidemia. The patient is currently taking pravastatin and fish oils. They deny any issues with myalgias or history of liver damage from it. They deny any focal numbness or weakness or chest pain.   Hypertension Patient is currently on amlodipine and lisinopril and furosemide, and their blood pressure today is 117/80. Patient denies any lightheadedness or dizziness. Patient denies headaches, blurred vision, chest pains, shortness of breath, or weakness. Denies any side effects from medication and is content with current medication.   Says she has pain down her right arm and tingling and numbness sometimes in her hand after sleeping.  She does not happen all the time but does happen sometimes when she needs a little bit more recently.  She does admit that she got a new pillow recently and it is a very heavy and dense and thick pillow.  Relevant past medical, surgical, family and social history reviewed and updated as indicated. Interim medical history since our last visit reviewed. Allergies and medications reviewed and updated.  Review of Systems  Constitutional:  Negative for chills and fever.  HENT:  Negative for congestion, ear discharge and ear pain.   Eyes:  Negative for redness and visual disturbance.  Respiratory:  Negative for chest tightness and shortness of breath.    Cardiovascular:  Negative for chest pain and leg swelling.  Genitourinary:  Negative for difficulty urinating and dysuria.  Musculoskeletal:  Positive for arthralgias. Negative for back pain and gait problem.  Skin:  Negative for rash.  Neurological:  Negative for light-headedness and headaches.  Psychiatric/Behavioral:  Negative for agitation and behavioral problems.   All other systems reviewed and are negative.   Per HPI unless specifically indicated above   Allergies as of 08/18/2022   No Known Allergies      Medication List        Accurate as of August 18, 2022  1:40 PM. If you have any questions, ask your nurse or doctor.          allopurinol 300 MG tablet Commonly known as: ZYLOPRIM Take 1 tablet (300 mg total) by mouth daily.   amLODipine 10 MG tablet Commonly known as: NORVASC Take 1 tablet (10 mg total) by mouth daily.   aspirin EC 81 MG tablet Take 81 mg by mouth daily.   Calcium Citrate 1040 MG Tabs Take by mouth.   Fish Oil 1000 MG Caps Take 1 capsule by mouth.   fluocinolone 0.01 % external solution Commonly known as: Synalar Apply two or three drops in the ear canal as needed for itching   fluticasone 50 MCG/ACT nasal spray Commonly known as: FLONASE Place 2 sprays into both nostrils as needed.   furosemide 20 MG tablet Commonly known as: LASIX Take 1 tablet (20 mg total) by mouth daily as needed. What  changed: See the new instructions. Changed by: Fransisca Kaufmann Crystalmarie Yasin, MD   lisinopril 40 MG tablet Commonly known as: ZESTRIL Take 1 tablet (40 mg total) by mouth daily.   loratadine 10 MG tablet Commonly known as: CLARITIN Take 10 mg by mouth daily.   meclizine 25 MG tablet Commonly known as: ANTIVERT Take 1 tablet (25 mg total) by mouth 3 (three) times daily as needed for dizziness.   multivitamin with minerals Tabs tablet Take 1 tablet by mouth daily.   omeprazole 20 MG capsule Commonly known as: PRILOSEC Take 1 capsule (20 mg  total) by mouth daily.   pravastatin 80 MG tablet Commonly known as: PRAVACHOL Take 1 tablet (80 mg total) by mouth every evening.   Vitamin D 50 MCG (2000 UT) Caps Take 1 capsule by mouth daily.         Objective:   BP 117/80   Pulse 86   Ht '5\' 4"'$  (1.626 m)   Wt 196 lb (88.9 kg)   SpO2 94%   BMI 33.64 kg/m   Wt Readings from Last 3 Encounters:  08/18/22 196 lb (88.9 kg)  03/19/22 197 lb (89.4 kg)  02/17/22 195 lb (88.5 kg)    Physical Exam Vitals and nursing note reviewed.  Constitutional:      General: She is not in acute distress.    Appearance: She is well-developed. She is not diaphoretic.  Eyes:     Conjunctiva/sclera: Conjunctivae normal.  Cardiovascular:     Rate and Rhythm: Normal rate and regular rhythm.     Heart sounds: Normal heart sounds. No murmur heard. Pulmonary:     Effort: Pulmonary effort is normal. No respiratory distress.     Breath sounds: Normal breath sounds. No wheezing.  Musculoskeletal:        General: No tenderness. Normal range of motion.     Right shoulder: No swelling, deformity, effusion or laceration.  Skin:    General: Skin is warm and dry.     Findings: No rash.  Neurological:     Mental Status: She is alert and oriented to person, place, and time.     Coordination: Coordination normal.  Psychiatric:        Behavior: Behavior normal.       Assessment & Plan:   Problem List Items Addressed This Visit       Cardiovascular and Mediastinum   Essential hypertension   Relevant Medications   lisinopril (ZESTRIL) 40 MG tablet   furosemide (LASIX) 20 MG tablet   amLODipine (NORVASC) 10 MG tablet     Digestive   GERD (gastroesophageal reflux disease)   Relevant Medications   omeprazole (PRILOSEC) 20 MG capsule   Other Relevant Orders   CBC with Differential/Platelet     Other   Hyperlipidemia   Relevant Medications   lisinopril (ZESTRIL) 40 MG tablet   furosemide (LASIX) 20 MG tablet   amLODipine (NORVASC) 10  MG tablet   Other Relevant Orders   Lipid panel   Gout   Relevant Medications   allopurinol (ZYLOPRIM) 300 MG tablet   Other Relevant Orders   CBC with Differential/Platelet   CMP14+EGFR   Hypertriglyceridemia - Primary   Relevant Medications   lisinopril (ZESTRIL) 40 MG tablet   furosemide (LASIX) 20 MG tablet   amLODipine (NORVASC) 10 MG tablet   Other Relevant Orders   CBC with Differential/Platelet   CMP14+EGFR   Lipid panel   Elevated blood sugar   Relevant Orders   Bayer DCA  Hb A1c Waived   Other Visit Diagnoses     Swelling       Relevant Medications   furosemide (LASIX) 20 MG tablet       Blood pressure looks decent, will check blood work today. Follow up plan: Return in about 6 months (around 02/18/2023), or if symptoms worsen or fail to improve, for Hypertension and hyperlipidemia.  Counseling provided for all of the vaccine components Orders Placed This Encounter  Procedures   CBC with Differential/Platelet   CMP14+EGFR   Lipid panel   Bayer DCA Hb A1c Waived    Caryl Pina, MD [facility] 08/18/2022, 1:40 PM

## 2022-08-19 LAB — CMP14+EGFR
ALT: 42 IU/L — ABNORMAL HIGH (ref 0–32)
AST: 51 IU/L — ABNORMAL HIGH (ref 0–40)
Albumin/Globulin Ratio: 1.5 (ref 1.2–2.2)
Albumin: 4.5 g/dL (ref 3.7–4.7)
Alkaline Phosphatase: 77 IU/L (ref 44–121)
BUN/Creatinine Ratio: 16 (ref 12–28)
BUN: 18 mg/dL (ref 8–27)
Bilirubin Total: 0.2 mg/dL (ref 0.0–1.2)
CO2: 24 mmol/L (ref 20–29)
Calcium: 10.7 mg/dL — ABNORMAL HIGH (ref 8.7–10.3)
Chloride: 104 mmol/L (ref 96–106)
Creatinine, Ser: 1.11 mg/dL — ABNORMAL HIGH (ref 0.57–1.00)
Globulin, Total: 3.1 g/dL (ref 1.5–4.5)
Glucose: 125 mg/dL — ABNORMAL HIGH (ref 70–99)
Potassium: 4.4 mmol/L (ref 3.5–5.2)
Sodium: 142 mmol/L (ref 134–144)
Total Protein: 7.6 g/dL (ref 6.0–8.5)
eGFR: 50 mL/min/{1.73_m2} — ABNORMAL LOW (ref >59)

## 2022-08-19 LAB — CBC WITH DIFFERENTIAL/PLATELET
Basophils Absolute: 0.1 10*3/uL (ref 0.0–0.2)
Basos: 1 %
EOS (ABSOLUTE): 0.1 10*3/uL (ref 0.0–0.4)
Eos: 1 %
Hematocrit: 41.8 % (ref 34.0–46.6)
Hemoglobin: 13.9 g/dL (ref 11.1–15.9)
Immature Grans (Abs): 0 10*3/uL (ref 0.0–0.1)
Immature Granulocytes: 0 %
Lymphocytes Absolute: 2.2 10*3/uL (ref 0.7–3.1)
Lymphs: 32 %
MCH: 30.7 pg (ref 26.6–33.0)
MCHC: 33.3 g/dL (ref 31.5–35.7)
MCV: 92 fL (ref 79–97)
Monocytes Absolute: 0.6 10*3/uL (ref 0.1–0.9)
Monocytes: 8 %
Neutrophils Absolute: 3.9 10*3/uL (ref 1.4–7.0)
Neutrophils: 58 %
Platelets: 352 10*3/uL (ref 150–450)
RBC: 4.53 x10E6/uL (ref 3.77–5.28)
RDW: 14.4 % (ref 11.7–15.4)
WBC: 6.8 10*3/uL (ref 3.4–10.8)

## 2022-08-19 LAB — LIPID PANEL
Chol/HDL Ratio: 4.4 ratio (ref 0.0–4.4)
Cholesterol, Total: 206 mg/dL — ABNORMAL HIGH (ref 100–199)
HDL: 47 mg/dL (ref >39)
LDL Chol Calc (NIH): 108 mg/dL — ABNORMAL HIGH (ref 0–99)
Triglycerides: 301 mg/dL — ABNORMAL HIGH (ref 0–149)
VLDL Cholesterol Cal: 51 mg/dL — ABNORMAL HIGH (ref 5–40)

## 2022-09-09 ENCOUNTER — Ambulatory Visit (INDEPENDENT_AMBULATORY_CARE_PROVIDER_SITE_OTHER): Payer: Medicare Other

## 2022-09-09 VITALS — Ht 63.0 in | Wt 196.0 lb

## 2022-09-09 DIAGNOSIS — Z780 Asymptomatic menopausal state: Secondary | ICD-10-CM | POA: Diagnosis not present

## 2022-09-09 DIAGNOSIS — Z0000 Encounter for general adult medical examination without abnormal findings: Secondary | ICD-10-CM | POA: Diagnosis not present

## 2022-09-09 NOTE — Progress Notes (Signed)
Subjective:   DELORIS BIERL is a 82 y.o. female who presents for Medicare Annual (Subsequent) preventive examination. I connected with  Greer Pickerel on [id number] by a audio enabled telemedicine application and verified that I am speaking with the correct person using two identifiers.  Patient Location: Home  Provider Location: Home Office  I discussed the limitations of evaluation and management by telemedicine. The patient expressed understanding and agreed to proceed.  Review of Systems     Cardiac Risk Factors include: advanced age (>1men, >59 women);hypertension;dyslipidemia     Objective:    Today's Vitals   09/09/22 0920  Weight: 196 lb (88.9 kg)  Height: 5\' 3"  (1.6 m)   Body mass index is 34.72 kg/m.     09/09/2022    9:24 AM 09/07/2021    9:14 AM 09/03/2020    9:00 AM 09/03/2019    8:50 AM 12/28/2017   11:05 AM 06/26/2014   10:05 AM 05/21/2014    7:20 PM  Advanced Directives  Does Patient Have a Medical Advance Directive? No Yes No Yes Yes Yes No  Type of Environmental health practitioner Power of [place];Living will   Does patient want to make changes to medical advance directive?     No - Patient declined No - Patient declined   Copy of [place] in Chart?  No - copy requested  No - copy requested Yes Yes   Would patient like information on creating a medical advance directive? No - Patient declined  No - Patient declined  No - Patient declined      Current Medications (verified) Outpatient Encounter Medications as of 09/09/2022  Medication Sig   allopurinol (ZYLOPRIM) 300 MG tablet Take 1 tablet (300 mg total) by mouth daily.   amLODipine (NORVASC) 10 MG tablet Take 1 tablet (10 mg total) by mouth daily.   aspirin EC 81 MG tablet Take 81 mg by mouth daily.   Calcium Citrate 1040 MG TABS Take by mouth.   Cholecalciferol (VITAMIN D) 2000 UNITS CAPS Take 1 capsule  by mouth daily.   fluocinolone (SYNALAR) 0.01 % external solution Apply two or three drops in the ear canal as needed for itching   fluticasone (FLONASE) 50 MCG/ACT nasal spray Place 2 sprays into both nostrils as needed.   furosemide (LASIX) 20 MG tablet Take 1 tablet (20 mg total) by mouth daily as needed.   lisinopril (ZESTRIL) 40 MG tablet Take 1 tablet (40 mg total) by mouth daily.   loratadine (CLARITIN) 10 MG tablet Take 10 mg by mouth daily.   meclizine (ANTIVERT) 25 MG tablet Take 1 tablet (25 mg total) by mouth 3 (three) times daily as needed for dizziness.   Multiple Vitamin (MULTIVITAMIN WITH MINERALS) TABS tablet Take 1 tablet by mouth daily.   Omega-3 Fatty Acids (FISH OIL) 1000 MG CAPS Take 1 capsule by mouth.   omeprazole (PRILOSEC) 20 MG capsule Take 1 capsule (20 mg total) by mouth daily.   pravastatin (PRAVACHOL) 80 MG tablet Take 1 tablet (80 mg total) by mouth every evening.   No facility-administered encounter medications on file as of 09/09/2022.    Allergies (verified) Patient has no known allergies.   History: Past Medical History:  Diagnosis Date   Compression fracture 05/24/2013   L1   GERD (gastroesophageal reflux disease)    Gout    Hyperlipidemia    Hypertension  Knee fracture, left feb 2015   no surgery done   Osteoporosis    Past Surgical History:  Procedure Laterality Date   ABDOMINAL HYSTERECTOMY  1992   complete   CHOLECYSTECTOMY N/A 06/28/2014   Procedure: LAPAROSCOPIC CHOLECYSTECTOMY ;  Surgeon: Fanny Skates, MD;  Location: [facility];  Service: General;  Laterality: N/A;   EYE SURGERY Bilateral 2013   lens for cataracts   LAMINECTOMY  1987   L5-S1   TONSILLECTOMY  1961   Family History  Problem Relation Age of Onset   Heart disease Mother    Cancer Father        smoker   Cancer Brother        lung and colon   Colon cancer Brother    Hip fracture Brother    Dementia Brother 53   COPD Brother    Parkinson's disease Brother     Social History   Socioeconomic History   Marital status: Married    Spouse name: Not on file   Number of children: 0   Years of education: 12   Highest education level: 12th grade  Occupational History   Occupation: Retired  Tobacco Use   Smoking status: Former    Types: Cigarettes    Quit date: 02/24/1979    Years since quitting: 43.5   Smokeless tobacco: Never   Tobacco comments:    smoked 2-3 cig a day when smoking, quit long ago  Vaping Use   Vaping Use: Never used  Substance and Sexual Activity   Alcohol use: No    Alcohol/week: 0.0 standard drinks of alcohol   Drug use: No   Sexual activity: Not Currently  Other Topics Concern   Not on file  Social History Narrative   Has 3 step-children - one passed away   Ex son-in-law lives nearby and checks on her often   Social Determinants of Health   Financial Resource Strain: Low Risk  (09/09/2022)   Overall Financial Resource Strain (CARDIA)    Difficulty of Paying Living Expenses: Not hard at all  Food Insecurity: No Food Insecurity (09/09/2022)   Hunger Vital Sign    Worried About Running Out of Food in the Last Year: Never true    Ran Out of Food in the Last Year: Never true  Transportation Needs: No Transportation Needs (09/09/2022)   PRAPARE - Hydrologist (Medical): No    Lack of Transportation (Non-Medical): No  Physical Activity: Inactive (09/09/2022)   Exercise Vital Sign    Days of Exercise per Week: 0 days    Minutes of Exercise per Session: 0 min  Stress: No Stress Concern Present (09/09/2022)   [place]    Feeling of Stress : Not at all  Social Connections: [place] (09/09/2022)   Social Connection and Isolation Panel [NHANES]    Frequency of Communication with Friends and Family: More than three times a week    Frequency of Social Gatherings with Friends and Family: More than three times a week     Attends Religious Services: More than 4 times per year    Active Member of [employer] or Organizations: Yes    Attends Music therapist: More than 4 times per year    Marital Status: Married    Tobacco Counseling Counseling given: Not Answered Tobacco comments: smoked 2-3 cig a day when smoking, quit long ago   Clinical Intake:  Pre-visit preparation completed: Yes  Pain : No/denies pain     Nutritional Risks: None Diabetes: No  How often do you need to have someone help you when you read instructions, pamphlets, or other written materials from your doctor or pharmacy?: 1 - Never  Diabetic?no   Interpreter Needed?: No  Information entered by :: Jadene Pierini, LPN   Activities of Daily Living    09/09/2022    9:24 AM  In your present state of health, do you have any difficulty performing the following activities:  Hearing? 0  Vision? 0  Difficulty concentrating or making decisions? 0  Walking or climbing stairs? 0  Dressing or bathing? 0  Doing errands, shopping? 0  Preparing Food and eating ? N  Using the Toilet? N  In the past six months, have you accidently leaked urine? N  Do you have problems with loss of bowel control? N  Managing your Medications? N  Managing your Finances? N  Housekeeping or managing your Housekeeping? N    Patient Care Team: Dettinger, Fransisca Kaufmann, MD as PCP - General (Family Medicine) Milus Banister, MD as Attending Physician (Gastroenterology) Monna Fam, MD as Consulting Physician (Ophthalmology)  Indicate any recent Medical Services you may have received from other than [facility] providers in the past year (date may be approximate).     Assessment:   This is a routine wellness examination for [place].  Hearing/Vision screen Vision Screening - Comments:: Wears rx glasses - up to date with routine eye exams with  Dr.Hecker   Dietary issues and exercise activities discussed: Current Exercise Habits: The patient does not  participate in regular exercise at present   Goals Addressed             This Visit's Progress    DIET - EAT MORE FRUITS AND VEGETABLES   On track      Depression Screen    09/09/2022    9:23 AM 08/18/2022    1:12 PM 03/19/2022    2:04 PM 02/17/2022   12:55 PM 12/03/2021    8:59 AM 09/07/2021    9:12 AM 08/17/2021    9:32 AM  PHQ 2/9 Scores  PHQ - 2 Score 0 0  0 0 0 0  PHQ- 9 Score 0 0  0 0    Exception Documentation   Patient refusal        Fall Risk    09/09/2022    9:22 AM 08/18/2022    1:12 PM 02/17/2022   12:55 PM 12/03/2021    8:59 AM 09/07/2021    9:06 AM  Fall Risk   Falls in the past year? 0 0 0 0 1  Number falls in past yr: 0    0  Injury with Fall? 0    0  Risk for fall due to : No Fall Risks    History of fall(s);Impaired balance/gait;Medication side effect  Follow up Falls prevention discussed    Education provided;Falls prevention discussed    FALL RISK PREVENTION PERTAINING TO THE HOME:  Any stairs in or around the home? No  If so, are there any without handrails? No  Home free of loose throw rugs in walkways, pet beds, electrical cords, etc? Yes  Adequate lighting in your home to reduce risk of falls? Yes   ASSISTIVE DEVICES UTILIZED TO PREVENT FALLS:  Life alert? No  Use of a cane, walker or w/c? Yes  Grab bars in the bathroom? Yes  Shower chair or bench in shower? Yes  Elevated toilet  seat or a handicapped toilet? Yes       12/28/2017   11:39 AM  MMSE - Mini Mental State Exam  Orientation to time 5  Orientation to Place 5  Registration 3  Attention/ Calculation 5  Recall 3  Language- name 2 objects 2  Language- repeat 1  Language- follow 3 step command 3  Language- read & follow direction 1  Write a sentence 1  Copy design 1  Total score 30        09/09/2022    9:25 AM 09/07/2021    9:16 AM 09/03/2020    9:03 AM 09/03/2019    8:52 AM  6CIT Screen  What Year? 0 points 0 points 0 points 0 points  What month? 0 points 0 points 0 points 0  points  What time? 0 points 0 points 0 points 0 points  Count back from 20 0 points 0 points 0 points 0 points  Months in reverse 0 points 0 points 0 points 0 points  Repeat phrase 0 points 4 points 0 points 0 points  Total Score 0 points 4 points 0 points 0 points    Immunizations Immunization History  Administered Date(s) Administered   Fluad Quad(high Dose 65+) 04/02/2019, 03/21/2020, 03/23/2021, 03/19/2022   Influenza, High Dose Seasonal PF 03/30/2016, 03/21/2017, 03/08/2018   Influenza,inj,Quad PF,6+ Mos 04/02/2014, 05/07/2015   Moderna Sars-Covid-2 Vaccination 06/19/2019, 07/20/2019, 04/10/2020   Pneumococcal Conjugate-13 02/19/2014   Pneumococcal Polysaccharide-23 03/30/2006   Tdap 04/02/2019   Zoster Recombinat (Shingrix) 08/07/2019, 11/15/2019    TDAP status: Up to date  Flu Vaccine status: Up to date  Pneumococcal vaccine status: Up to date  Covid-19 vaccine status: Completed vaccines  Qualifies for Shingles Vaccine? Yes   Zostavax completed Yes   Shingrix Completed?: Yes  Screening Tests Health Maintenance  Topic Date Due   COVID-19 Vaccine (4 - 2023-24 season) 02/05/2022   DEXA SCAN  02/20/2024 (Originally 04/02/2022)   INFLUENZA VACCINE  01/06/2023   MAMMOGRAM  01/12/2023   Medicare Annual Wellness (AWV)  09/09/2023   DTaP/Tdap/Td (2 - Td or Tdap) 04/01/2029   Pneumonia Vaccine 47+ Years old  Completed   Zoster Vaccines- Shingrix  Completed   HPV VACCINES  Aged Out    Health Maintenance  Health Maintenance Due  Topic Date Due   COVID-19 Vaccine (4 - 2023-24 season) 02/05/2022    Colorectal cancer screening: No longer required.   Mammogram status: No longer required due to age .  Bone Density status: Ordered 09/09/2022. Pt provided with contact info and advised to call to schedule appt.  Lung Cancer Screening: (Low Dose CT Chest recommended if Age 31-80 years, 30 pack-year currently smoking OR have quit w/in 15years.) does not qualify.   Lung  Cancer Screening Referral: n/a  Additional Screening:  Hepatitis C Screening: does not qualify;   Vision Screening: Recommended annual ophthalmology exams for early detection of glaucoma and other disorders of the eye. Is the patient up to date with their annual eye exam?  Yes  Who is the provider or what is the name of the office in which the patient attends annual eye exams? Dr.hecker  If pt is not established with a provider, would they like to be referred to a provider to establish care? No .   Dental Screening: Recommended annual dental exams for proper oral hygiene  Community Resource Referral / Chronic Care Management: CRR required this visit?  No   CCM required this visit?  No  Plan:     I have personally reviewed and noted the following in the patient's chart:   Medical and social history Use of alcohol, tobacco or illicit drugs  Current medications and supplements including opioid prescriptions. Patient is not currently taking opioid prescriptions. Functional ability and status Nutritional status Physical activity Advanced directives List of other physicians Hospitalizations, surgeries, and ER visits in previous 12 months Vitals Screenings to include cognitive, depression, and falls Referrals and appointments  In addition, I have reviewed and discussed with patient certain preventive protocols, quality metrics, and best practice recommendations. A written personalized care plan for preventive services as well as general preventive health recommendations were provided to patient.     Daphane Shepherd, LPN   [id number]   Nurse Notes: none

## 2022-09-09 NOTE — Patient Instructions (Signed)
Caitlyn Boone , Thank you for taking time to come for your Medicare Wellness Visit. I appreciate your ongoing commitment to your health goals. Please review the following plan we discussed and let me know if I can assist you in the future.   These are the goals we discussed:  Goals      DIET - EAT MORE FRUITS AND VEGETABLES     Exercise 150 min/wk Moderate Activity     Exercise 150 minutes per week (moderate activity)     [facility] Sneakers program at the [facility]     Patient Stated     09/03/2020 AWV Goal: Fall Prevention  Over the next year, patient will decrease their risk for falls by: Using assistive devices, such as a cane or walker, as needed Identifying fall risks within their home and correcting them by: Removing throw rugs Adding handrails to stairs or ramps Removing clutter and keeping a clear pathway throughout the home Increasing light, especially at night Adding shower handles/bars Raising toilet seat Identifying potential personal risk factors for falls: Medication side effects Incontinence/urgency Vestibular dysfunction Hearing loss Musculoskeletal disorders Neurological disorders Orthostatic hypotension          This is a list of the screening recommended for you and due dates:  Health Maintenance  Topic Date Due   COVID-19 Vaccine (4 - 2023-24 season) 02/05/2022   DEXA scan (bone density measurement)  02/20/2024*   Flu Shot  01/06/2023   Mammogram  01/12/2023   Medicare Annual Wellness Visit  09/09/2023   DTaP/Tdap/Td vaccine (2 - Td or Tdap) 04/01/2029   Pneumonia Vaccine  Completed   Zoster (Shingles) Vaccine  Completed   HPV Vaccine  Aged Out  *Topic was postponed. The date shown is not the original due date.    Advanced directives: Advance directive discussed with you today. I have provided a copy for you to complete at home and have notarized. Once this is complete please bring a copy in to our office so we can scan it into your chart.    Conditions/risks identified: Aim for 30 minutes of exercise or brisk walking, 6-8 glasses of water, and 5 servings of fruits and vegetables each day.   Next appointment: Follow up in one year for your annual wellness visit    Preventive Care 65 Years and Older, Female Preventive care refers to lifestyle choices and visits with your health care provider that can promote health and wellness. What does preventive care include? A yearly physical exam. This is also called an annual well check. Dental exams once or twice a year. Routine eye exams. Ask your health care provider how often you should have your eyes checked. Personal lifestyle choices, including: Daily care of your teeth and gums. Regular physical activity. Eating a healthy diet. Avoiding tobacco and drug use. Limiting alcohol use. Practicing safe sex. Taking low-dose aspirin every day. Taking vitamin and mineral supplements as recommended by your health care provider. What happens during an annual well check? The services and screenings done by your health care provider during your annual well check will depend on your age, overall health, lifestyle risk factors, and family history of disease. Counseling  Your health care provider may ask you questions about your: Alcohol use. Tobacco use. Drug use. Emotional well-being. Home and relationship well-being. Sexual activity. Eating habits. History of falls. Memory and ability to understand (cognition). Work and work Statistician. Reproductive health. Screening  You may have the following tests or measurements: Height, weight, and BMI. Blood  pressure. Lipid and cholesterol levels. These may be checked every 5 years, or more frequently if you are over 59 years old. Skin check. Lung cancer screening. You may have this screening every year starting at age 87 if you have a 30-pack-year history of smoking and currently smoke or have quit within the past 15 years. Fecal  occult blood test (FOBT) of the stool. You may have this test every year starting at age 70. Flexible sigmoidoscopy or colonoscopy. You may have a sigmoidoscopy every 5 years or a colonoscopy every 10 years starting at age 72. Hepatitis C blood test. Hepatitis B blood test. Sexually transmitted disease (STD) testing. Diabetes screening. This is done by checking your blood sugar (glucose) after you have not eaten for a while (fasting). You may have this done every 1-3 years. Bone density scan. This is done to screen for osteoporosis. You may have this done starting at age 27. Mammogram. This may be done every 1-2 years. Talk to your health care provider about how often you should have regular mammograms. Talk with your health care provider about your test results, treatment options, and if necessary, the need for more tests. Vaccines  Your health care provider may recommend certain vaccines, such as: Influenza vaccine. This is recommended every year. Tetanus, diphtheria, and acellular pertussis (Tdap, Td) vaccine. You may need a Td booster every 10 years. Zoster vaccine. You may need this after age 6. Pneumococcal 13-valent conjugate (PCV13) vaccine. One dose is recommended after age 19. Pneumococcal polysaccharide (PPSV23) vaccine. One dose is recommended after age 61. Talk to your health care provider about which screenings and vaccines you need and how often you need them. This information is not intended to replace advice given to you by your health care provider. Make sure you discuss any questions you have with your health care provider. Document Released: 06/20/2015 Document Revised: 02/11/2016 Document Reviewed: 03/25/2015 Elsevier Interactive Patient Education  2017 [employer] Prevention in the Home Falls can cause injuries. They can happen to people of all ages. There are many things you can do to make your home safe and to help prevent falls. What can I do on the outside  of my home? Regularly fix the edges of walkways and driveways and fix any cracks. Remove anything that might make you trip as you walk through a door, such as a raised step or threshold. Trim any bushes or trees on the path to your home. Use bright outdoor lighting. Clear any walking paths of anything that might make someone trip, such as rocks or tools. Regularly check to see if handrails are loose or broken. Make sure that both sides of any steps have handrails. Any raised decks and porches should have guardrails on the edges. Have any leaves, snow, or ice cleared regularly. Use sand or salt on walking paths during winter. Clean up any spills in your garage right away. This includes oil or grease spills. What can I do in the bathroom? Use night lights. Install grab bars by the toilet and in the tub and shower. Do not use towel bars as grab bars. Use non-skid mats or decals in the tub or shower. If you need to sit down in the shower, use a plastic, non-slip stool. Keep the floor dry. Clean up any water that spills on the floor as soon as it happens. Remove soap buildup in the tub or shower regularly. Attach bath mats securely with double-sided non-slip rug tape. Do not have throw  rugs and other things on the floor that can make you trip. What can I do in the bedroom? Use night lights. Make sure that you have a light by your bed that is easy to reach. Do not use any sheets or blankets that are too big for your bed. They should not hang down onto the floor. Have a firm chair that has side arms. You can use this for support while you get dressed. Do not have throw rugs and other things on the floor that can make you trip. What can I do in the kitchen? Clean up any spills right away. Avoid walking on wet floors. Keep items that you use a lot in easy-to-reach places. If you need to reach something above you, use a strong step stool that has a grab bar. Keep electrical cords out of the  way. Do not use floor polish or wax that makes floors slippery. If you must use wax, use non-skid floor wax. Do not have throw rugs and other things on the floor that can make you trip. What can I do with my stairs? Do not leave any items on the stairs. Make sure that there are handrails on both sides of the stairs and use them. Fix handrails that are broken or loose. Make sure that handrails are as long as the stairways. Check any carpeting to make sure that it is firmly attached to the stairs. Fix any carpet that is loose or worn. Avoid having throw rugs at the top or bottom of the stairs. If you do have throw rugs, attach them to the floor with carpet tape. Make sure that you have a light switch at the top of the stairs and the bottom of the stairs. If you do not have them, ask someone to add them for you. What else can I do to help prevent falls? Wear shoes that: Do not have high heels. Have rubber bottoms. Are comfortable and fit you well. Are closed at the toe. Do not wear sandals. If you use a stepladder: Make sure that it is fully opened. Do not climb a closed stepladder. Make sure that both sides of the stepladder are locked into place. Ask someone to hold it for you, if possible. Clearly mark and make sure that you can see: Any grab bars or handrails. First and last steps. Where the edge of each step is. Use tools that help you move around (mobility aids) if they are needed. These include: Canes. Walkers. Scooters. Crutches. Turn on the lights when you go into a dark area. Replace any light bulbs as soon as they burn out. Set up your furniture so you have a clear path. Avoid moving your furniture around. If any of your floors are uneven, fix them. If there are any pets around you, be aware of where they are. Review your medicines with your doctor. Some medicines can make you feel dizzy. This can increase your chance of falling. Ask your doctor what other things that you can  do to help prevent falls. This information is not intended to replace advice given to you by your health care provider. Make sure you discuss any questions you have with your health care provider. Document Released: 03/20/2009 Document Revised: 10/30/2015 Document Reviewed: 06/28/2014 Elsevier Interactive Patient Education  2017 [employer].

## 2022-09-28 ENCOUNTER — Telehealth: Payer: Self-pay | Admitting: Family Medicine

## 2022-09-30 ENCOUNTER — Ambulatory Visit (INDEPENDENT_AMBULATORY_CARE_PROVIDER_SITE_OTHER): Payer: Medicare Other

## 2022-09-30 ENCOUNTER — Other Ambulatory Visit: Payer: Self-pay | Admitting: Family Medicine

## 2022-09-30 DIAGNOSIS — Z780 Asymptomatic menopausal state: Secondary | ICD-10-CM

## 2022-10-01 DIAGNOSIS — M810 Age-related osteoporosis without current pathological fracture: Secondary | ICD-10-CM | POA: Diagnosis not present

## 2022-10-18 ENCOUNTER — Telehealth: Payer: Self-pay | Admitting: Family Medicine

## 2022-10-18 ENCOUNTER — Other Ambulatory Visit: Payer: Self-pay | Admitting: Family Medicine

## 2022-10-18 DIAGNOSIS — M810 Age-related osteoporosis without current pathological fracture: Secondary | ICD-10-CM

## 2022-10-18 NOTE — Telephone Encounter (Signed)
Orders placed to referral with Raynelle Fanning

## 2022-10-19 ENCOUNTER — Telehealth: Payer: Self-pay

## 2022-10-19 NOTE — Progress Notes (Signed)
   Care Guide Note  10/19/2022 Name: [name] MRN: [id number] DOB: [birth year]  Referred by: Dettinger, Elige Radon, MD Reason for referral : Care Coordination (Outreach to schedule with Pharm d )   Caitlyn Boone is a 82 y.o. year old female who is a primary care patient of Dettinger, Elige Radon, MD. [name] was referred to the pharmacist for assistance related to  osteoperosis .    Successful contact was made with the patient to discuss pharmacy services including being ready for the pharmacist to call at least 5 minutes before the scheduled appointment time, to have medication bottles and any blood sugar or blood pressure readings ready for review. The patient agreed to meet with the pharmacist via with the pharmacist via telephone visit on (date/time).  11/30/2022  Penne Lash, RMA Care Guide [facility]  [place], [place] [address] Direct Dial: [phone] [email or web address]

## 2022-11-30 ENCOUNTER — Telehealth: Payer: Self-pay | Admitting: Pharmacist

## 2022-11-30 ENCOUNTER — Encounter: Payer: Self-pay | Admitting: Pharmacist

## 2022-11-30 ENCOUNTER — Ambulatory Visit (INDEPENDENT_AMBULATORY_CARE_PROVIDER_SITE_OTHER): Payer: Medicare Other | Admitting: Pharmacist

## 2022-11-30 DIAGNOSIS — M810 Age-related osteoporosis without current pathological fracture: Secondary | ICD-10-CM

## 2022-11-30 MED ORDER — DENOSUMAB 60 MG/ML ~~LOC~~ SOSY
60.0000 mg | PREFILLED_SYRINGE | SUBCUTANEOUS | 0 refills | Status: DC
Start: 2022-11-30 — End: 2023-01-26

## 2022-11-30 NOTE — Progress Notes (Signed)
    11/30/2022 Name: Caitlyn Boone MRN: [id number] DOB: 09-11-1940   S:  68 yoF Presents for osteoporosis evaluation, education, and management  Current Height:  5'3"      Max Lifetime Height:  5'3" Current Weight:   196lb      Ethnicity:Caucasian   HPI: Does pt already have a diagnosis of:  Osteoporosis?  Yes  Back Pain?  Yes       Prior fracture?  Yes history of fragility fractures (pt foot, back) Med(s) for Osteoporosis/Osteopenia:  vitamin D/calcium Med(s) previously tried for Osteoporosis/Osteopenia:   Fosamax d/c at last PCP office visit because she has been on it for 6 years (2022); uses quad cane to walk due to instability; has life alert                                                             PMH: Hysterectomy?  Yes Oophorectomy?  Yes HRT? No Steroid Use?  No Thyroid med?  No History of cancer?  No History of digestive disorders (ie Crohn's)?  Yes; GERD on PPI Current or previous eating disorders?  No Last Vitamin D Result:  53.2 (07/12/17) Last GFR Result:  50 (08/18/22)   FH/SH: Family history of osteoporosis?  Yes mother Parent with history of hip fracture?  No Exercise?  No Smoking?  No Alcohol?  No    Calcium Assessment Calcium Intake  # of servings/day  Calcium mg  Milk (8 oz) 1   x  300  = 300  Yogurt (4 oz) 0 x  200 = 0  Cheese (1 oz) 0 x  200 = 0  Other Calcium sources   250mg   Ca supplement 650 = 650   Estimated calcium intake per day 1200    DEXA Results Date of Test T-Score for AP Spine L1-L4 T-Score for Total Left Hip T-Score for Total Left femoral neck T-Score for Total Right Hip T-Score for Total Right femoral neck  09/30/22 -0.3 -2.4 -3.2 -1.9 -2.2  04/02/2020 -0.2  -2.6    12/10/2016 0.3      10/29/2014 -0.6         FRAX 10-YEAR PROBABILITY OF FRACTURE:   FRAX not reported as the lowest BMD is not in the osteopenia range.   IMPRESSION: Osteoporosis based on BMD.   Fracture risk is increased. Increased risk is based on low BMD,  and history of fracture.  Recommendations: 1.  Consider Prolia q6 months, however we will determine insurance benefits  Patient to receive dental clearance 2.  continue calcium 1200mg  daily through supplementation or diet.  3.  continue weight bearing exercise - 30 minutes at least 4 days per week.   4.  Counseled and educated about fall risk and prevention.  Recheck DEXA:  2 years  Time spent counseling patient:  20 minutes   Kieth Brightly, PharmD, BCACP Clinical Pharmacist, [facility]

## 2022-11-30 NOTE — Telephone Encounter (Signed)
New start Prolia Please complete PA/run benefits exploration for Prolia 60mg  q 6 months Patient's daughter would like to know copay before decision is made; they are okay going back on alendronate if prolia too costly; not interested in Reclast infusion at this time Lowest T-score -2.6 History of fractures (back, foot) 5 yrs of alendronate therapy already completed

## 2022-12-01 ENCOUNTER — Telehealth: Payer: Self-pay

## 2022-12-01 ENCOUNTER — Other Ambulatory Visit (HOSPITAL_COMMUNITY): Payer: Self-pay

## 2022-12-01 NOTE — Telephone Encounter (Signed)
Prolia VOB initiated via [email or web address] 

## 2022-12-01 NOTE — Telephone Encounter (Signed)
Created new encounter for Prolia BIV. Will route encounter back once benefit verification is complete.  

## 2022-12-01 NOTE — Telephone Encounter (Signed)
Received call from insurance about PA. [employer] requires trial and failure of both oral and IV bisphosphate. Can get Prolia through pharmacy benefit, copay is $300, or can change to Reclast. Reclast does not require a PA. For more information or to cancel PA for Prolia, you can reach out to Tammy at [phone] ext. [phone].

## 2022-12-01 NOTE — Telephone Encounter (Addendum)
Request Number: [id number]

## 2022-12-07 ENCOUNTER — Other Ambulatory Visit: Payer: Self-pay | Admitting: Family Medicine

## 2022-12-07 DIAGNOSIS — Z1231 Encounter for screening mammogram for malignant neoplasm of breast: Secondary | ICD-10-CM

## 2022-12-08 ENCOUNTER — Encounter: Payer: Self-pay | Admitting: Pharmacist

## 2022-12-23 ENCOUNTER — Encounter: Payer: Self-pay | Admitting: Family Medicine

## 2022-12-24 ENCOUNTER — Ambulatory Visit: Payer: Medicare Other | Admitting: Family Medicine

## 2022-12-24 MED ORDER — ALENDRONATE SODIUM 70 MG PO TABS: 70.0000 mg | ORAL_TABLET | ORAL | 11 refills | Status: DC

## 2022-12-24 NOTE — Telephone Encounter (Signed)
Prescription sent in for fosamax  Meds ordered this encounter  Medications   alendronate (FOSAMAX) 70 MG tablet    Sig: Take 1 tablet (70 mg total) by mouth every 7 (seven) days. Take with a full glass of water on an empty stomach.    Dispense:  4 tablet    Refill:  11    Order Specific Question:   Supervising Provider    Answer:   Arville Care A [[id number]]   Mary-Margaret Daphine Deutscher, FNP

## 2022-12-28 DIAGNOSIS — H524 Presbyopia: Secondary | ICD-10-CM | POA: Diagnosis not present

## 2022-12-28 DIAGNOSIS — H26491 Other secondary cataract, right eye: Secondary | ICD-10-CM | POA: Diagnosis not present

## 2022-12-28 DIAGNOSIS — H34231 Retinal artery branch occlusion, right eye: Secondary | ICD-10-CM | POA: Diagnosis not present

## 2022-12-28 DIAGNOSIS — H31092 Other chorioretinal scars, left eye: Secondary | ICD-10-CM | POA: Diagnosis not present

## 2022-12-28 DIAGNOSIS — H40013 Open angle with borderline findings, low risk, bilateral: Secondary | ICD-10-CM | POA: Diagnosis not present

## 2023-01-19 ENCOUNTER — Encounter: Payer: Self-pay | Admitting: Podiatry

## 2023-01-19 ENCOUNTER — Ambulatory Visit: Payer: Medicare Other | Admitting: Podiatry

## 2023-01-19 VITALS — BP 145/74

## 2023-01-19 DIAGNOSIS — L84 Corns and callosities: Secondary | ICD-10-CM

## 2023-01-19 DIAGNOSIS — L601 Onycholysis: Secondary | ICD-10-CM | POA: Diagnosis not present

## 2023-01-19 DIAGNOSIS — B351 Tinea unguium: Secondary | ICD-10-CM | POA: Diagnosis not present

## 2023-01-19 NOTE — Addendum Note (Signed)
Addended byMaury Dus, Jasmeen Fritsch L on: 01/19/2023 10:53 AM   Modules accepted: Orders

## 2023-01-19 NOTE — Progress Notes (Signed)
  Subjective:  Patient ID: Caitlyn Boone, female    DOB: 1940-12-24,   MRN: [id number]  Chief Complaint  Patient presents with   Nail Problem    Np- nail fungus. RFC. callus removal    82 y.o. female presents for concern of thickened elongated and painful nails that are difficult to trim. Concern for fungus in the nails  Also relates calluses that are painful and difficult to take care of. She is not diabetic  PCP:  Dettinger, Elige Radon, MD    . Denies any other pedal complaints. Denies n/v/f/c.   Past Medical History:  Diagnosis Date   Compression fracture 05/24/2013   L1   GERD (gastroesophageal reflux disease)    Gout    Hyperlipidemia    Hypertension    Knee fracture, left feb 2015   no surgery done   Osteoporosis     Objective:  Physical Exam: Vascular: DP/PT pulses 2/4 bilateral. CFT <3 seconds. Normal hair growth on digits. No edema.  Skin. No lacerations or abrasions bilateral feet. Nails 1-5 biltearl are thickened and elongated with subungual debris. Hyperkeratotic tissue noted sub first metatarsal bilateral and sub fifth metatarsal on the left.  Musculoskeletal: MMT 5/5 bilateral lower extremities in DF, PF, Inversion and Eversion. Deceased ROM in DF of ankle joint.  Neurological: Sensation intact to light touch.   Assessment:   1. Onychomycosis   2. Callus of foot      Plan:  Patient was evaluated and treated and all questions answered. -Hyperkeratotic tissue debrided with chisel without incident as courtesy.  -Mechanically debrided all nails 1-5 bilateral using sterile nail nipper and filed with dremel without incident as courtesy. -Discussed treatment options for painful dystrophic nails  -Clinical picture and Fungal culture was obtained by removing a portion of the hard nail itself from each of the involved toenails using a sterile nail nipper and sent to [facility] lab. Patient tolerated the biopsy procedure well without discomfort or need for anesthesia.   -Discussed fungal nail treatment options including oral, topical, and laser treatments.  -Patient to return in 6 weeks for follow up evaluation and discussion of fungal culture results or sooner if symptoms worsen.   Louann Sjogren, DPM

## 2023-01-24 ENCOUNTER — Ambulatory Visit: Payer: Medicare Other

## 2023-01-26 ENCOUNTER — Ambulatory Visit (INDEPENDENT_AMBULATORY_CARE_PROVIDER_SITE_OTHER): Payer: Medicare Other | Admitting: Family Medicine

## 2023-01-26 ENCOUNTER — Encounter: Payer: Self-pay | Admitting: Family Medicine

## 2023-01-26 VITALS — BP 127/78 | HR 65 | Ht 63.0 in | Wt 192.0 lb

## 2023-01-26 DIAGNOSIS — E785 Hyperlipidemia, unspecified: Secondary | ICD-10-CM

## 2023-01-26 DIAGNOSIS — R42 Dizziness and giddiness: Secondary | ICD-10-CM | POA: Diagnosis not present

## 2023-01-26 DIAGNOSIS — E781 Pure hyperglyceridemia: Secondary | ICD-10-CM | POA: Diagnosis not present

## 2023-01-26 DIAGNOSIS — F419 Anxiety disorder, unspecified: Secondary | ICD-10-CM

## 2023-01-26 DIAGNOSIS — R609 Edema, unspecified: Secondary | ICD-10-CM

## 2023-01-26 DIAGNOSIS — M810 Age-related osteoporosis without current pathological fracture: Secondary | ICD-10-CM | POA: Diagnosis not present

## 2023-01-26 DIAGNOSIS — E782 Mixed hyperlipidemia: Secondary | ICD-10-CM

## 2023-01-26 DIAGNOSIS — I10 Essential (primary) hypertension: Secondary | ICD-10-CM | POA: Diagnosis not present

## 2023-01-26 MED ORDER — FUROSEMIDE 20 MG PO TABS
20.0000 mg | ORAL_TABLET | Freq: Every day | ORAL | 1 refills | Status: DC | PRN
Start: 2023-01-26 — End: 2024-01-30

## 2023-01-26 MED ORDER — AMLODIPINE BESYLATE 5 MG PO TABS
5.0000 mg | ORAL_TABLET | Freq: Every day | ORAL | 3 refills | Status: DC
Start: 2023-01-26 — End: 2024-01-30

## 2023-01-26 MED ORDER — PRAVASTATIN SODIUM 80 MG PO TABS
80.0000 mg | ORAL_TABLET | Freq: Every evening | ORAL | 3 refills | Status: DC
Start: 2023-01-26 — End: 2024-01-30

## 2023-01-26 MED ORDER — MECLIZINE HCL 25 MG PO TABS
25.0000 mg | ORAL_TABLET | Freq: Three times a day (TID) | ORAL | 3 refills | Status: DC | PRN
Start: 2023-01-26 — End: 2024-01-30

## 2023-01-26 MED ORDER — HYDROCHLOROTHIAZIDE 12.5 MG PO CAPS
12.5000 mg | ORAL_CAPSULE | Freq: Every day | ORAL | 1 refills | Status: DC
Start: 2023-01-26 — End: 2023-07-11

## 2023-01-26 MED ORDER — DULOXETINE HCL 30 MG PO CPEP: 30.0000 mg | ORAL_CAPSULE | Freq: Every day | ORAL | 1 refills | Status: DC

## 2023-01-26 NOTE — Progress Notes (Signed)
Positive  BP 127/78   Pulse 65   Ht 5\' 3"  (1.6 m)   Wt 192 lb (87.1 kg)   SpO2 96%   BMI 34.01 kg/m    Subjective:   Patient ID: Caitlyn Boone, female    DOB: Oct 18, 1940, 82 y.o.   MRN: [id number]  HPI: Caitlyn Boone is a 82 y.o. female presenting on 01/26/2023 for Medical Management of Chronic Issues, Edema (BLE. ? If BP med ( Amlodipine) is causing), and Dizziness (? If needs Rx strength of Meclizine)   HPI Hyperlipidemia and hypertriglyceridemia Patient is coming in for recheck of his hyperlipidemia and hypertriglyceridemia. The patient is currently taking pravastatin and fish oils. They deny any issues with myalgias or history of liver damage from it. They deny any focal numbness or weakness or chest pain.   Hypertension Patient is currently on amlodipine and lisinopril and furosemide, and their blood pressure today is 127/78. Patient denies any lightheadedness or dizziness. Patient denies headaches, blurred vision, chest pains, shortness of breath, or weakness. Denies any side effects from medication and is content with current medication.  She does get swelling in her legs, that is the only real side effect  Osteoporosis/osteopenia Fractures or history of fracture: None Medication: Alendronate Duration of treatment: Had it for 5 years then was off of it for 3 or 4 years and just started it back up a few months ago Last bone density scan: 10/01/2022 Last T score: -3.2, last time it was -2.6  She has been having a lot more anxiety and agitation with her husband's health and worried about him and family has noticed this.  Her son is moving in soon to help take care of her but he noticed when he was staying with her that they are just more agitated and especially her and are looking to help with some medicine.  Relevant past medical, surgical, family and social history reviewed and updated as indicated. Interim medical history since our last visit reviewed. Allergies and medications  reviewed and updated.  Review of Systems  Constitutional:  Negative for chills and fever.  HENT:  Negative for congestion, ear discharge and ear pain.   Eyes:  Negative for redness and visual disturbance.  Respiratory:  Negative for chest tightness and shortness of breath.   Cardiovascular:  Positive for leg swelling. Negative for chest pain.  Genitourinary:  Negative for difficulty urinating and dysuria.  Musculoskeletal:  Negative for back pain and gait problem.  Skin:  Negative for rash.  Neurological:  Negative for light-headedness and headaches.  Psychiatric/Behavioral:  Positive for dysphoric mood. Negative for agitation and behavioral problems. The patient is nervous/anxious.   All other systems reviewed and are negative.   Per HPI unless specifically indicated above   Allergies as of 01/26/2023   No Known Allergies      Medication List        Accurate as of January 26, 2023  4:54 PM. If you have any questions, ask your nurse or doctor.          STOP taking these medications    denosumab 60 MG/ML Sosy injection Commonly known as: PROLIA Stopped by: Elige Radon Yazmen Briones   fluocinolone 0.01 % external solution Commonly known as: Synalar Stopped by: Elige Radon Dannilynn Gallina   fluticasone 50 MCG/ACT nasal spray Commonly known as: FLONASE Stopped by: Elige Radon Yaneli Keithley       TAKE these medications    alendronate 70 MG tablet Commonly known as: FOSAMAX Take 1  tablet (70 mg total) by mouth every 7 (seven) days. Take with a full glass of water on an empty stomach.   allopurinol 300 MG tablet Commonly known as: ZYLOPRIM Take 1 tablet (300 mg total) by mouth daily.   amLODipine 5 MG tablet Commonly known as: NORVASC Take 1 tablet (5 mg total) by mouth daily. What changed:  medication strength how much to take Changed by: Elige Radon Lakeith Careaga   aspirin EC 81 MG tablet Take 81 mg by mouth daily.   Calcium Citrate 1040 MG Tabs Take by mouth.   DULoxetine 30 MG  capsule Commonly known as: Cymbalta Take 1 capsule (30 mg total) by mouth daily. Started by: Elige Radon Ivone Licht   Fish Oil 1000 MG Caps Take 1 capsule by mouth.   furosemide 20 MG tablet Commonly known as: LASIX Take 1 tablet (20 mg total) by mouth daily as needed.   hydrochlorothiazide 12.5 MG capsule Commonly known as: MICROZIDE Take 1 capsule (12.5 mg total) by mouth daily. Started by: Elige Radon Ericberto Padget   lisinopril 40 MG tablet Commonly known as: ZESTRIL Take 1 tablet (40 mg total) by mouth daily.   loratadine 10 MG tablet Commonly known as: CLARITIN Take 10 mg by mouth daily.   meclizine 25 MG tablet Commonly known as: ANTIVERT Take 1 tablet (25 mg total) by mouth 3 (three) times daily as needed for dizziness.   multivitamin with minerals Tabs tablet Take 1 tablet by mouth daily.   omeprazole 20 MG capsule Commonly known as: PRILOSEC Take 1 capsule (20 mg total) by mouth daily.   pravastatin 80 MG tablet Commonly known as: PRAVACHOL Take 1 tablet (80 mg total) by mouth every evening.   Vitamin D 50 MCG (2000 UT) Caps Take 1 capsule by mouth daily.         Objective:   BP 127/78   Pulse 65   Ht 5\' 3"  (1.6 m)   Wt 192 lb (87.1 kg)   SpO2 96%   BMI 34.01 kg/m   Wt Readings from Last 3 Encounters:  01/26/23 192 lb (87.1 kg)  09/09/22 196 lb (88.9 kg)  08/18/22 196 lb (88.9 kg)    Physical Exam Vitals and nursing note reviewed.  Constitutional:      General: She is not in acute distress.    Appearance: She is well-developed. She is not diaphoretic.  Eyes:     Conjunctiva/sclera: Conjunctivae normal.     Pupils: Pupils are equal, round, and reactive to light.  Cardiovascular:     Rate and Rhythm: Normal rate and regular rhythm.     Heart sounds: Normal heart sounds. No murmur heard. Pulmonary:     Effort: Pulmonary effort is normal. No respiratory distress.     Breath sounds: Normal breath sounds. No wheezing.  Musculoskeletal:         General: Swelling (Trace bilateral lower extremity edema) present. No tenderness. Normal range of motion.  Skin:    General: Skin is warm and dry.     Findings: No rash.  Neurological:     Mental Status: She is alert and oriented to person, place, and time.     Coordination: Coordination normal.  Psychiatric:        Behavior: Behavior normal.       Assessment & Plan:   Problem List Items Addressed This Visit       Cardiovascular and Mediastinum   Essential hypertension   Relevant Medications   furosemide (LASIX) 20 MG tablet  pravastatin (PRAVACHOL) 80 MG tablet   amLODipine (NORVASC) 5 MG tablet   hydrochlorothiazide (MICROZIDE) 12.5 MG capsule   Other Relevant Orders   CBC with Differential/Platelet   CMP14+EGFR   Lipid panel     Musculoskeletal and Integument   Osteoporosis   Relevant Orders   VITAMIN D 25 Hydroxy (Vit-D Deficiency, Fractures)     Other   Hyperlipidemia - Primary   Relevant Medications   furosemide (LASIX) 20 MG tablet   pravastatin (PRAVACHOL) 80 MG tablet   amLODipine (NORVASC) 5 MG tablet   hydrochlorothiazide (MICROZIDE) 12.5 MG capsule   Other Relevant Orders   CBC with Differential/Platelet   CMP14+EGFR   Lipid panel   Hypertriglyceridemia   Relevant Medications   furosemide (LASIX) 20 MG tablet   pravastatin (PRAVACHOL) 80 MG tablet   amLODipine (NORVASC) 5 MG tablet   hydrochlorothiazide (MICROZIDE) 12.5 MG capsule   Anxiety   Relevant Medications   DULoxetine (CYMBALTA) 30 MG capsule   Other Visit Diagnoses     Swelling       Relevant Medications   furosemide (LASIX) 20 MG tablet   Vertigo       Relevant Medications   meclizine (ANTIVERT) 25 MG tablet     She needs refill on her vertigo medicine, she gets that every now and then.  She does not have vertigo currently  Will start duloxetine to help with anxiety and agitation especially related to her husband's health  Will switch from amlodipine 10 mg to amlodipine 5  mg and add hydrochlorothiazide 12.5 mg.  Instructed her to hold the furosemide for now and only take as needed.  Follow up plan: Return in about 6 months (around 07/29/2023), or if symptoms worsen or fail to improve, for Hypertension and hyperlipidemia recheck.  Counseling provided for all of the vaccine components Orders Placed This Encounter  Procedures   CBC with Differential/Platelet   CMP14+EGFR   Lipid panel   VITAMIN D 25 Hydroxy (Vit-D Deficiency, Fractures)    Arville Care, MD [facility] 01/26/2023, 4:54 PM

## 2023-01-27 LAB — LIPID PANEL
Chol/HDL Ratio: 4.4 ratio (ref 0.0–4.4)
Cholesterol, Total: 214 mg/dL — ABNORMAL HIGH (ref 100–199)
HDL: 49 mg/dL (ref >39)
LDL Chol Calc (NIH): 119 mg/dL — ABNORMAL HIGH (ref 0–99)
Triglycerides: 262 mg/dL — ABNORMAL HIGH (ref 0–149)
VLDL Cholesterol Cal: 46 mg/dL — ABNORMAL HIGH (ref 5–40)

## 2023-01-27 LAB — CBC WITH DIFFERENTIAL/PLATELET
Basophils Absolute: 0.1 10*3/uL (ref 0.0–0.2)
Basos: 1 %
EOS (ABSOLUTE): 0.1 10*3/uL (ref 0.0–0.4)
Eos: 2 %
Hematocrit: 41.6 % (ref 34.0–46.6)
Hemoglobin: 13.8 g/dL (ref 11.1–15.9)
Immature Grans (Abs): 0 10*3/uL (ref 0.0–0.1)
Immature Granulocytes: 0 %
Lymphocytes Absolute: 2.1 10*3/uL (ref 0.7–3.1)
Lymphs: 33 %
MCH: 30.9 pg (ref 26.6–33.0)
MCHC: 33.2 g/dL (ref 31.5–35.7)
MCV: 93 fL (ref 79–97)
Monocytes Absolute: 0.5 10*3/uL (ref 0.1–0.9)
Monocytes: 8 %
Neutrophils Absolute: 3.6 10*3/uL (ref 1.4–7.0)
Neutrophils: 56 %
Platelets: 339 10*3/uL (ref 150–450)
RBC: 4.46 x10E6/uL (ref 3.77–5.28)
RDW: 14.2 % (ref 11.7–15.4)
WBC: 6.4 10*3/uL (ref 3.4–10.8)

## 2023-01-27 LAB — CMP14+EGFR
ALT: 39 IU/L — ABNORMAL HIGH (ref 0–32)
AST: 48 IU/L — ABNORMAL HIGH (ref 0–40)
Albumin: 4.6 g/dL (ref 3.7–4.7)
Alkaline Phosphatase: 82 IU/L (ref 44–121)
BUN/Creatinine Ratio: 21 (ref 12–28)
BUN: 19 mg/dL (ref 8–27)
Bilirubin Total: <0.2 mg/dL (ref 0.0–1.2)
CO2: 23 mmol/L (ref 20–29)
Calcium: 10.6 mg/dL — ABNORMAL HIGH (ref 8.7–10.3)
Chloride: 105 mmol/L (ref 96–106)
Creatinine, Ser: 0.91 mg/dL (ref 0.57–1.00)
Globulin, Total: 2.9 g/dL (ref 1.5–4.5)
Glucose: 150 mg/dL — ABNORMAL HIGH (ref 70–99)
Potassium: 4.3 mmol/L (ref 3.5–5.2)
Sodium: 144 mmol/L (ref 134–144)
Total Protein: 7.5 g/dL (ref 6.0–8.5)
eGFR: 63 mL/min/{1.73_m2} (ref >59)

## 2023-01-27 LAB — VITAMIN D 25 HYDROXY (VIT D DEFICIENCY, FRACTURES): Vit D, 25-Hydroxy: 47.7 ng/mL (ref 30.0–100.0)

## 2023-02-05 ENCOUNTER — Encounter: Payer: Self-pay | Admitting: Nurse Practitioner

## 2023-02-05 ENCOUNTER — Telehealth: Payer: Medicare Other | Admitting: Nurse Practitioner

## 2023-02-05 DIAGNOSIS — U071 COVID-19: Secondary | ICD-10-CM

## 2023-02-05 MED ORDER — NIRMATRELVIR/RITONAVIR (PAXLOVID)TABLET
3.0000 | ORAL_TABLET | Freq: Two times a day (BID) | ORAL | 0 refills | Status: AC
Start: 2023-02-05 — End: 2023-02-10

## 2023-02-05 NOTE — Patient Instructions (Signed)
Caitlyn Boone, thank you for joining [name], NP for today's virtual visit.  While this provider is not your primary care provider (PCP), if your PCP is located in our provider database this encounter information will be shared with them immediately following your visit.   A [place] MyChart account gives you access to today's visit and all your visits, tests, and labs performed at [facility] " click here if you don't have a [facility] MyChart account or go to [email or web address]  Consent: (Patient) Caitlyn Boone provided verbal consent for this virtual visit at the beginning of the encounter.  Current Medications:  Current Outpatient Medications:    nirmatrelvir/ritonavir (PAXLOVID) 20 x 150 MG & 10 x 100MG  TABS, Take 3 tablets by mouth 2 (two) times daily for 5 days. (Take nirmatrelvir 150 mg two tablets twice daily for 5 days and ritonavir 100 mg one tablet twice daily for 5 days) Patient GFR is 63, Disp: 30 tablet, Rfl: 0   alendronate (FOSAMAX) 70 MG tablet, Take 1 tablet (70 mg total) by mouth every 7 (seven) days. Take with a full glass of water on an empty stomach., Disp: 4 tablet, Rfl: 11   allopurinol (ZYLOPRIM) 300 MG tablet, Take 1 tablet (300 mg total) by mouth daily., Disp: 90 tablet, Rfl: 3   amLODipine (NORVASC) 5 MG tablet, Take 1 tablet (5 mg total) by mouth daily., Disp: 90 tablet, Rfl: 3   aspirin EC 81 MG tablet, Take 81 mg by mouth daily., Disp: , Rfl:    Calcium Citrate 1040 MG TABS, Take by mouth., Disp: , Rfl:    Cholecalciferol (VITAMIN D) 2000 UNITS CAPS, Take 1 capsule by mouth daily., Disp: , Rfl:    DULoxetine (CYMBALTA) 30 MG capsule, Take 1 capsule (30 mg total) by mouth daily., Disp: 90 capsule, Rfl: 1   furosemide (LASIX) 20 MG tablet, Take 1 tablet (20 mg total) by mouth daily as needed., Disp: 90 tablet, Rfl: 1   hydrochlorothiazide (MICROZIDE) 12.5 MG capsule, Take 1 capsule (12.5 mg total) by mouth daily., Disp: 90 capsule,  Rfl: 1   lisinopril (ZESTRIL) 40 MG tablet, Take 1 tablet (40 mg total) by mouth daily., Disp: 90 tablet, Rfl: 3   loratadine (CLARITIN) 10 MG tablet, Take 10 mg by mouth daily., Disp: , Rfl:    meclizine (ANTIVERT) 25 MG tablet, Take 1 tablet (25 mg total) by mouth 3 (three) times daily as needed for dizziness., Disp: 90 tablet, Rfl: 3   Multiple Vitamin (MULTIVITAMIN WITH MINERALS) TABS tablet, Take 1 tablet by mouth daily., Disp: , Rfl:    Omega-3 Fatty Acids (FISH OIL) 1000 MG CAPS, Take 1 capsule by mouth., Disp: , Rfl:    omeprazole (PRILOSEC) 20 MG capsule, Take 1 capsule (20 mg total) by mouth daily., Disp: 90 capsule, Rfl: 3   pravastatin (PRAVACHOL) 80 MG tablet, Take 1 tablet (80 mg total) by mouth every evening., Disp: 90 tablet, Rfl: 3   Medications ordered in this encounter:  Meds ordered this encounter  Medications   nirmatrelvir/ritonavir (PAXLOVID) 20 x 150 MG & 10 x 100MG  TABS    Sig: Take 3 tablets by mouth 2 (two) times daily for 5 days. (Take nirmatrelvir 150 mg two tablets twice daily for 5 days and ritonavir 100 mg one tablet twice daily for 5 days) Patient GFR is 63    Dispense:  30 tablet    Refill:  0    Order Specific Question:   Supervising  Provider    Answer:   Merrilee Jansky [[id number]]     *If you need refills on other medications prior to your next appointment, please contact your pharmacy*  Follow-Up: Call back or seek an in-person evaluation if the symptoms worsen or if the condition fails to improve as anticipated.  [facility] [phone]  Other Instructions  Please keep well-hydrated and get plenty of rest. Start a saline nasal rinse to flush out your nasal passages. You can use plain Mucinex to help thin congestion. If you have a humidifier, you can use this daily as needed.    You are to wear a mask for 5 days from onset of your symptoms.  After day 5, if you have had no fever and you are feeling better with NO symptoms, you  can end masking. Keep in mind you can be contagious 10 days from the onset of symptoms  After day 5 if you have a fever or are having significant symptoms, please wear your mask for full 10 days.   If you note any worsening of symptoms, any significant shortness of breath or any chest pain, please seek ER evaluation ASAP.  Please do not delay care!    If you note any worsening of symptoms, any significant shortness of breath or any chest pain, please seek ER evaluation ASAP.  Please do not delay care!    If you have been instructed to have an in-person evaluation today at a local Urgent Care facility, please use the link below. It will take you to a list of all of our available [facility] [place] [facility], including address, phone number and hours of operation. Please do not delay care.  [facility]  If you or a family member do not have a primary care provider, use the link below to schedule a visit and establish care. When you choose a [facility] primary care physician or advanced practice provider, you gain a long-term partner in health. Find a Primary Care Provider  Learn more about [facility]'s in-office and virtual care options: [facility] - Get Care Now

## 2023-02-05 NOTE — Progress Notes (Signed)
Virtual Visit Consent   Caitlyn Boone, you are scheduled for a virtual visit with a [facility] provider today. Just as with appointments in the office, your consent must be obtained to participate. Your consent will be active for this visit and any virtual visit you may have with one of our providers in the next 365 days. If you have a MyChart account, a copy of this consent can be sent to you electronically.  As this is a virtual visit, video technology does not allow for your provider to perform a traditional examination. This may limit your provider's ability to fully assess your condition. If your provider identifies any concerns that need to be evaluated in person or the need to arrange testing (such as labs, EKG, etc.), we will make arrangements to do so. Although advances in technology are sophisticated, we cannot ensure that it will always work on either your end or our end. If the connection with a video visit is poor, the visit may have to be switched to a telephone visit. With either a video or telephone visit, we are not always able to ensure that we have a secure connection.  By engaging in this virtual visit, you consent to the provision of healthcare and authorize for your insurance to be billed (if applicable) for the services provided during this visit. Depending on your insurance coverage, you may receive a charge related to this service.  I need to obtain your verbal consent now. Are you willing to proceed with your visit today? Caitlyn Boone has provided verbal consent on 02/05/2023 for a virtual visit (video or telephone). Caitlyn Rigg, NP  Date: 02/05/2023 10:15 AM  Virtual Visit via Video Note   I, Caitlyn Boone, connected with  Caitlyn Boone  ([id number], Feb 21, 1941) on 02/05/23 at  9:15 AM EDT by a video-enabled telemedicine application and verified that I am speaking with the correct person using two identifiers.  Location: Patient: Virtual Visit Location Patient:  Home Provider: Virtual Visit Location Provider: Office/Clinic   I discussed the limitations of evaluation and management by telemedicine and the availability of in person appointments. The patient expressed understanding and agreed to proceed.    History of Present Illness: Caitlyn Boone is a 82 y.o. who identifies as a female who was assigned female at birth, and is being seen today for COVID positive.    [name] tested positive this morning for COVID. The following symptoms started yesterday: headache, cough, sneezing, congestion and fatigue. She denies fever, chest pain or shortness of breath.   Problems:  Patient Active Problem List   Diagnosis Date Noted   Anxiety 01/26/2023   Hypertriglyceridemia 08/18/2022   Elevated blood sugar 08/18/2022   Closed nondisplaced fracture of left patella 03/09/2018   Obesity (BMI 30.0-34.9) 02/16/2016   Gallstones 06/28/2014   Hyperlipidemia 02/19/2014   Osteoporosis 02/19/2014   Essential hypertension 02/19/2014   GERD (gastroesophageal reflux disease) 02/19/2014   Gout 02/19/2014    Allergies: No Known Allergies Medications:  Current Outpatient Medications:    nirmatrelvir/ritonavir (PAXLOVID) 20 x 150 MG & 10 x 100MG  TABS, Take 3 tablets by mouth 2 (two) times daily for 5 days. (Take nirmatrelvir 150 mg two tablets twice daily for 5 days and ritonavir 100 mg one tablet twice daily for 5 days) Patient GFR is 63, Disp: 30 tablet, Rfl: 0   alendronate (FOSAMAX) 70 MG tablet, Take 1 tablet (70 mg total) by mouth every 7 (seven) days. Take with  a full glass of water on an empty stomach., Disp: 4 tablet, Rfl: 11   allopurinol (ZYLOPRIM) 300 MG tablet, Take 1 tablet (300 mg total) by mouth daily., Disp: 90 tablet, Rfl: 3   amLODipine (NORVASC) 5 MG tablet, Take 1 tablet (5 mg total) by mouth daily., Disp: 90 tablet, Rfl: 3   aspirin EC 81 MG tablet, Take 81 mg by mouth daily., Disp: , Rfl:    Calcium Citrate 1040 MG TABS, Take by mouth., Disp:  , Rfl:    Cholecalciferol (VITAMIN D) 2000 UNITS CAPS, Take 1 capsule by mouth daily., Disp: , Rfl:    DULoxetine (CYMBALTA) 30 MG capsule, Take 1 capsule (30 mg total) by mouth daily., Disp: 90 capsule, Rfl: 1   furosemide (LASIX) 20 MG tablet, Take 1 tablet (20 mg total) by mouth daily as needed., Disp: 90 tablet, Rfl: 1   hydrochlorothiazide (MICROZIDE) 12.5 MG capsule, Take 1 capsule (12.5 mg total) by mouth daily., Disp: 90 capsule, Rfl: 1   lisinopril (ZESTRIL) 40 MG tablet, Take 1 tablet (40 mg total) by mouth daily., Disp: 90 tablet, Rfl: 3   loratadine (CLARITIN) 10 MG tablet, Take 10 mg by mouth daily., Disp: , Rfl:    meclizine (ANTIVERT) 25 MG tablet, Take 1 tablet (25 mg total) by mouth 3 (three) times daily as needed for dizziness., Disp: 90 tablet, Rfl: 3   Multiple Vitamin (MULTIVITAMIN WITH MINERALS) TABS tablet, Take 1 tablet by mouth daily., Disp: , Rfl:    Omega-3 Fatty Acids (FISH OIL) 1000 MG CAPS, Take 1 capsule by mouth., Disp: , Rfl:    omeprazole (PRILOSEC) 20 MG capsule, Take 1 capsule (20 mg total) by mouth daily., Disp: 90 capsule, Rfl: 3   pravastatin (PRAVACHOL) 80 MG tablet, Take 1 tablet (80 mg total) by mouth every evening., Disp: 90 tablet, Rfl: 3  Observations/Objective: Patient is well-developed, well-nourished in no acute distress.  Resting comfortably at home.  Head is normocephalic, atraumatic.  No labored breathing.  Speech is clear and coherent with logical content.  Patient is alert and oriented at baseline.    Assessment and Plan: 1. Positive self-administered antigen test for COVID-19 - nirmatrelvir/ritonavir (PAXLOVID) 20 x 150 MG & 10 x 100MG  TABS; Take 3 tablets by mouth 2 (two) times daily for 5 days. (Take nirmatrelvir 150 mg two tablets twice daily for 5 days and ritonavir 100 mg one tablet twice daily for 5 days) Patient GFR is 63  Dispense: 30 tablet; Refill: 0   Please keep well-hydrated and get plenty of rest. Start a saline nasal  rinse to flush out your nasal passages. You can use plain Mucinex to help thin congestion. If you have a humidifier, you can use this daily as needed.    You are to wear a mask for 5 days from onset of your symptoms.  After day 5, if you have had no fever and you are feeling better with NO symptoms, you can end masking. Keep in mind you can be contagious 10 days from the onset of symptoms  After day 5 if you have a fever or are having significant symptoms, please wear your mask for full 10 days.   If you note any worsening of symptoms, any significant shortness of breath or any chest pain, please seek ER evaluation ASAP.  Please do not delay care!    If you note any worsening of symptoms, any significant shortness of breath or any chest pain, please seek ER evaluation ASAP.  Please do not delay care!   Follow Up Instructions: I discussed the assessment and treatment plan with the patient. The patient was provided an opportunity to ask questions and all were answered. The patient agreed with the plan and demonstrated an understanding of the instructions.  A copy of instructions were sent to the patient via MyChart unless otherwise noted below.    The patient was advised to call back or seek an in-person evaluation if the symptoms worsen or if the condition fails to improve as anticipated.  Time:  I spent 12 minutes with the patient via telehealth technology discussing the above problems/concerns.    Caitlyn Rigg, NP

## 2023-02-08 ENCOUNTER — Ambulatory Visit: Payer: Medicare Other

## 2023-02-18 ENCOUNTER — Other Ambulatory Visit: Payer: Medicare Other

## 2023-02-18 ENCOUNTER — Ambulatory Visit: Payer: Medicare Other | Admitting: Family Medicine

## 2023-02-28 ENCOUNTER — Encounter: Payer: Self-pay | Admitting: Podiatry

## 2023-03-02 ENCOUNTER — Ambulatory Visit: Payer: Medicare Other | Admitting: Podiatry

## 2023-03-02 ENCOUNTER — Encounter: Payer: Self-pay | Admitting: Podiatry

## 2023-03-02 DIAGNOSIS — B351 Tinea unguium: Secondary | ICD-10-CM | POA: Diagnosis not present

## 2023-03-02 MED ORDER — CICLOPIROX 8 % EX SOLN: Freq: Every day | CUTANEOUS | 0 refills | Status: DC

## 2023-03-02 NOTE — Progress Notes (Signed)
Subjective:  Patient ID: Caitlyn Boone, female    DOB: [birth year],   MRN: [id number]  Chief Complaint  Patient presents with   Nail Problem     Pt  presents for follow-up of fungal nails and to discuss culture results.      82 y.o. female presents for follow-up of fungal nails and to discuss culture results.  She is not diabetic  PCP:  Dettinger, Elige Radon, MD    . Denies any other pedal complaints. Denies n/v/f/c.   Past Medical History:  Diagnosis Date   Compression fracture 05/24/2013   L1   GERD (gastroesophageal reflux disease)    Gout    Hyperlipidemia    Hypertension    Knee fracture, left feb 2015   no surgery done   Osteoporosis     Objective:  Physical Exam: Vascular: DP/PT pulses 2/4 bilateral. CFT <3 seconds. Normal hair growth on digits. No edema.  Skin. No lacerations or abrasions bilateral feet. Nails 1-5 biltearl are thickened and elongated with subungual debris. Hyperkeratotic tissue noted sub first metatarsal bilateral and sub fifth metatarsal on the left.  Musculoskeletal: MMT 5/5 bilateral lower extremities in DF, PF, Inversion and Eversion. Deceased ROM in DF of ankle joint.  Neurological: Sensation intact to light touch.   Assessment:   No diagnosis found.    Plan:  Patient was evaluated and treated and all questions answered. -Hyperkeratotic tissue debrided with chisel without incident as courtesy.  -Mechanically debrided all nails 1-5 bilateral using sterile nail nipper and filed with dremel without incident as courtesy. -Discussed treatment options for painful dystrophic nails  -Culture with fungal elements present and signs of microtrauma.   -Discussed fungal nail treatment options including oral, topical, and laser treatments.  -Her last LFTS were elevated so will opt to try topical medication first -Patient to return in 3 months for check on progress.    Louann Sjogren, DPM

## 2023-03-11 ENCOUNTER — Ambulatory Visit (INDEPENDENT_AMBULATORY_CARE_PROVIDER_SITE_OTHER): Payer: Medicare Other

## 2023-03-11 DIAGNOSIS — Z23 Encounter for immunization: Secondary | ICD-10-CM

## 2023-04-04 ENCOUNTER — Ambulatory Visit
Admission: RE | Admit: 2023-04-04 | Discharge: 2023-04-04 | Disposition: A | Discharge status: Home / Self Care | Payer: Medicare Other | Source: Ambulatory Visit | Attending: Family Medicine | Admitting: Family Medicine

## 2023-04-04 DIAGNOSIS — Z1231 Encounter for screening mammogram for malignant neoplasm of breast: Secondary | ICD-10-CM | POA: Diagnosis not present

## 2023-06-13 ENCOUNTER — Encounter: Payer: Self-pay | Admitting: Podiatry

## 2023-06-13 ENCOUNTER — Ambulatory Visit: Payer: Medicare Other | Admitting: Podiatry

## 2023-06-13 DIAGNOSIS — B351 Tinea unguium: Secondary | ICD-10-CM | POA: Diagnosis not present

## 2023-06-13 MED ORDER — CICLOPIROX 8 % EX SOLN: Freq: Every day | CUTANEOUS | 0 refills | Status: DC

## 2023-06-13 NOTE — Progress Notes (Signed)
  Subjective:  Patient ID: Caitlyn Boone, female    DOB: 1941-03-06,   MRN: [id number]  No chief complaint on file.   83 y.o. female presents for follow-up of fungal nails. Has been using the penlac . .  She is not diabetic  PCP:  Dettinger, Fonda LABOR, MD    . Denies any other pedal complaints. Denies n/v/f/c.   Past Medical History:  Diagnosis Date   Compression fracture 05/24/2013   L1   GERD (gastroesophageal reflux disease)    Gout    Hyperlipidemia    Hypertension    Knee fracture, left feb 2015   no surgery done   Osteoporosis     Objective:  Physical Exam: Vascular: DP/PT pulses 2/4 bilateral. CFT <3 seconds. Normal hair growth on digits. No edema.  Skin. No lacerations or abrasions bilateral feet. Nails 1-5 biltearl are thickened and elongated with subungual debris. There is some improvement at proximal nail plate. Hyperkeratotic tissue noted sub first metatarsal bilateral and sub fifth metatarsal on the left.  Musculoskeletal: MMT 5/5 bilateral lower extremities in DF, PF, Inversion and Eversion. Deceased ROM in DF of ankle joint.  Neurological: Sensation intact to light touch.   Assessment:   1. Onychomycosis       Plan:  Patient was evaluated and treated and all questions answered. -Hyperkeratotic tissue debrided with chisel without incident as courtesy.  -Mechanically debrided all nails 1-5 bilateral using sterile nail nipper and filed with dremel without incident as courtesy. -Discussed treatment options for painful dystrophic nails  -Culture with fungal elements present and signs of microtrauma.   -Discussed fungal nail treatment options including oral, topical, and laser treatments.  Continue penlac  refill sent.  -Patient to return in 4 months for check on progress.    [name], DPM

## 2023-06-29 ENCOUNTER — Other Ambulatory Visit: Payer: Self-pay

## 2023-06-29 MED ORDER — CICLOPIROX 8 % EX SOLN: Freq: Every day | CUTANEOUS | 0 refills | Status: DC

## 2023-07-11 ENCOUNTER — Other Ambulatory Visit: Payer: Self-pay | Admitting: Family Medicine

## 2023-07-11 DIAGNOSIS — F419 Anxiety disorder, unspecified: Secondary | ICD-10-CM

## 2023-07-11 DIAGNOSIS — I10 Essential (primary) hypertension: Secondary | ICD-10-CM

## 2023-07-17 DIAGNOSIS — S0990XA Unspecified injury of head, initial encounter: Secondary | ICD-10-CM | POA: Diagnosis not present

## 2023-07-17 DIAGNOSIS — S52601A Unspecified fracture of lower end of right ulna, initial encounter for closed fracture: Secondary | ICD-10-CM | POA: Diagnosis not present

## 2023-07-17 DIAGNOSIS — M8008XA Age-related osteoporosis with current pathological fracture, vertebra(e), initial encounter for fracture: Secondary | ICD-10-CM | POA: Diagnosis not present

## 2023-07-17 DIAGNOSIS — S0083XA Contusion of other part of head, initial encounter: Secondary | ICD-10-CM | POA: Diagnosis not present

## 2023-07-17 DIAGNOSIS — Z79899 Other long term (current) drug therapy: Secondary | ICD-10-CM | POA: Diagnosis not present

## 2023-07-17 DIAGNOSIS — S42231A 3-part fracture of surgical neck of right humerus, initial encounter for closed fracture: Secondary | ICD-10-CM | POA: Diagnosis not present

## 2023-07-17 DIAGNOSIS — S4991XA Unspecified injury of right shoulder and upper arm, initial encounter: Secondary | ICD-10-CM | POA: Diagnosis not present

## 2023-07-17 DIAGNOSIS — S42411A Displaced simple supracondylar fracture without intercondylar fracture of right humerus, initial encounter for closed fracture: Secondary | ICD-10-CM | POA: Diagnosis not present

## 2023-07-17 DIAGNOSIS — M79621 Pain in right upper arm: Secondary | ICD-10-CM | POA: Diagnosis not present

## 2023-07-17 DIAGNOSIS — M4802 Spinal stenosis, cervical region: Secondary | ICD-10-CM | POA: Diagnosis not present

## 2023-07-17 DIAGNOSIS — M546 Pain in thoracic spine: Secondary | ICD-10-CM | POA: Diagnosis not present

## 2023-07-17 DIAGNOSIS — S0003XA Contusion of scalp, initial encounter: Secondary | ICD-10-CM | POA: Diagnosis not present

## 2023-07-17 DIAGNOSIS — Z043 Encounter for examination and observation following other accident: Secondary | ICD-10-CM | POA: Diagnosis not present

## 2023-07-17 DIAGNOSIS — S42491A Other displaced fracture of lower end of right humerus, initial encounter for closed fracture: Secondary | ICD-10-CM | POA: Diagnosis not present

## 2023-07-17 DIAGNOSIS — M25531 Pain in right wrist: Secondary | ICD-10-CM | POA: Diagnosis not present

## 2023-07-17 DIAGNOSIS — S52091A Other fracture of upper end of right ulna, initial encounter for closed fracture: Secondary | ICD-10-CM | POA: Diagnosis not present

## 2023-07-17 DIAGNOSIS — S42441A Displaced fracture (avulsion) of medial epicondyle of right humerus, initial encounter for closed fracture: Secondary | ICD-10-CM | POA: Diagnosis not present

## 2023-07-17 DIAGNOSIS — M25511 Pain in right shoulder: Secondary | ICD-10-CM | POA: Diagnosis not present

## 2023-07-17 DIAGNOSIS — W1830XA Fall on same level, unspecified, initial encounter: Secondary | ICD-10-CM | POA: Diagnosis not present

## 2023-07-17 DIAGNOSIS — S52291A Other fracture of shaft of right ulna, initial encounter for closed fracture: Secondary | ICD-10-CM | POA: Diagnosis not present

## 2023-07-17 DIAGNOSIS — S22088A Other fracture of T11-T12 vertebra, initial encounter for closed fracture: Secondary | ICD-10-CM | POA: Diagnosis not present

## 2023-07-17 DIAGNOSIS — I10 Essential (primary) hypertension: Secondary | ICD-10-CM | POA: Diagnosis not present

## 2023-07-17 DIAGNOSIS — Z7982 Long term (current) use of aspirin: Secondary | ICD-10-CM | POA: Diagnosis not present

## 2023-07-17 DIAGNOSIS — S299XXA Unspecified injury of thorax, initial encounter: Secondary | ICD-10-CM | POA: Diagnosis not present

## 2023-07-17 DIAGNOSIS — M542 Cervicalgia: Secondary | ICD-10-CM | POA: Diagnosis not present

## 2023-07-17 DIAGNOSIS — S2231XA Fracture of one rib, right side, initial encounter for closed fracture: Secondary | ICD-10-CM | POA: Diagnosis not present

## 2023-07-17 DIAGNOSIS — S42431A Displaced fracture (avulsion) of lateral epicondyle of right humerus, initial encounter for closed fracture: Secondary | ICD-10-CM | POA: Diagnosis not present

## 2023-07-18 DIAGNOSIS — S42491A Other displaced fracture of lower end of right humerus, initial encounter for closed fracture: Secondary | ICD-10-CM | POA: Diagnosis not present

## 2023-07-19 DIAGNOSIS — Z136 Encounter for screening for cardiovascular disorders: Secondary | ICD-10-CM | POA: Diagnosis not present

## 2023-07-21 DIAGNOSIS — S42441A Displaced fracture (avulsion) of medial epicondyle of right humerus, initial encounter for closed fracture: Secondary | ICD-10-CM | POA: Diagnosis not present

## 2023-07-21 DIAGNOSIS — R262 Difficulty in walking, not elsewhere classified: Secondary | ICD-10-CM | POA: Diagnosis not present

## 2023-07-21 DIAGNOSIS — R0902 Hypoxemia: Secondary | ICD-10-CM | POA: Diagnosis not present

## 2023-07-21 DIAGNOSIS — S42461A Displaced fracture of medial condyle of right humerus, initial encounter for closed fracture: Secondary | ICD-10-CM | POA: Diagnosis not present

## 2023-07-21 DIAGNOSIS — S42491A Other displaced fracture of lower end of right humerus, initial encounter for closed fracture: Secondary | ICD-10-CM | POA: Diagnosis not present

## 2023-07-21 DIAGNOSIS — G8918 Other acute postprocedural pain: Secondary | ICD-10-CM | POA: Diagnosis not present

## 2023-07-21 DIAGNOSIS — S42451A Displaced fracture of lateral condyle of right humerus, initial encounter for closed fracture: Secondary | ICD-10-CM | POA: Diagnosis not present

## 2023-07-21 DIAGNOSIS — J9811 Atelectasis: Secondary | ICD-10-CM | POA: Diagnosis not present

## 2023-07-21 DIAGNOSIS — I10 Essential (primary) hypertension: Secondary | ICD-10-CM | POA: Diagnosis not present

## 2023-07-21 DIAGNOSIS — K219 Gastro-esophageal reflux disease without esophagitis: Secondary | ICD-10-CM | POA: Diagnosis not present

## 2023-07-21 DIAGNOSIS — S42421A Displaced comminuted supracondylar fracture without intercondylar fracture of right humerus, initial encounter for closed fracture: Secondary | ICD-10-CM | POA: Diagnosis not present

## 2023-07-21 DIAGNOSIS — M810 Age-related osteoporosis without current pathological fracture: Secondary | ICD-10-CM | POA: Diagnosis not present

## 2023-07-21 DIAGNOSIS — S42401A Unspecified fracture of lower end of right humerus, initial encounter for closed fracture: Secondary | ICD-10-CM | POA: Diagnosis not present

## 2023-07-21 DIAGNOSIS — Z79899 Other long term (current) drug therapy: Secondary | ICD-10-CM | POA: Diagnosis not present

## 2023-07-22 DIAGNOSIS — K219 Gastro-esophageal reflux disease without esophagitis: Secondary | ICD-10-CM | POA: Diagnosis not present

## 2023-07-22 DIAGNOSIS — R262 Difficulty in walking, not elsewhere classified: Secondary | ICD-10-CM | POA: Diagnosis not present

## 2023-07-22 DIAGNOSIS — S42461A Displaced fracture of medial condyle of right humerus, initial encounter for closed fracture: Secondary | ICD-10-CM | POA: Diagnosis not present

## 2023-07-22 DIAGNOSIS — S42441A Displaced fracture (avulsion) of medial epicondyle of right humerus, initial encounter for closed fracture: Secondary | ICD-10-CM | POA: Diagnosis not present

## 2023-07-22 DIAGNOSIS — G8918 Other acute postprocedural pain: Secondary | ICD-10-CM | POA: Diagnosis not present

## 2023-07-22 DIAGNOSIS — M810 Age-related osteoporosis without current pathological fracture: Secondary | ICD-10-CM | POA: Diagnosis not present

## 2023-07-22 DIAGNOSIS — R0902 Hypoxemia: Secondary | ICD-10-CM | POA: Diagnosis not present

## 2023-07-22 DIAGNOSIS — S42451A Displaced fracture of lateral condyle of right humerus, initial encounter for closed fracture: Secondary | ICD-10-CM | POA: Diagnosis not present

## 2023-07-22 DIAGNOSIS — J9811 Atelectasis: Secondary | ICD-10-CM | POA: Diagnosis not present

## 2023-07-22 DIAGNOSIS — I10 Essential (primary) hypertension: Secondary | ICD-10-CM | POA: Diagnosis not present

## 2023-07-22 DIAGNOSIS — Z79899 Other long term (current) drug therapy: Secondary | ICD-10-CM | POA: Diagnosis not present

## 2023-07-22 DIAGNOSIS — S42491A Other displaced fracture of lower end of right humerus, initial encounter for closed fracture: Secondary | ICD-10-CM | POA: Diagnosis not present

## 2023-07-24 DIAGNOSIS — R0902 Hypoxemia: Secondary | ICD-10-CM | POA: Diagnosis not present

## 2023-08-01 ENCOUNTER — Encounter: Payer: Self-pay | Admitting: Family Medicine

## 2023-08-01 ENCOUNTER — Ambulatory Visit: Payer: Medicare Other | Admitting: Family Medicine

## 2023-08-01 VITALS — BP 143/75 | HR 76 | Ht 63.0 in | Wt 191.0 lb

## 2023-08-01 DIAGNOSIS — S2232XD Fracture of one rib, left side, subsequent encounter for fracture with routine healing: Secondary | ICD-10-CM | POA: Diagnosis not present

## 2023-08-01 DIAGNOSIS — I10 Essential (primary) hypertension: Secondary | ICD-10-CM

## 2023-08-01 DIAGNOSIS — I6501 Occlusion and stenosis of right vertebral artery: Secondary | ICD-10-CM

## 2023-08-01 DIAGNOSIS — E782 Mixed hyperlipidemia: Secondary | ICD-10-CM

## 2023-08-01 DIAGNOSIS — S42401D Unspecified fracture of lower end of right humerus, subsequent encounter for fracture with routine healing: Secondary | ICD-10-CM | POA: Diagnosis not present

## 2023-08-01 DIAGNOSIS — S22080K Wedge compression fracture of T11-T12 vertebra, subsequent encounter for fracture with nonunion: Secondary | ICD-10-CM

## 2023-08-01 DIAGNOSIS — E781 Pure hyperglyceridemia: Secondary | ICD-10-CM | POA: Diagnosis not present

## 2023-08-01 NOTE — Progress Notes (Signed)
 BP (!) 143/75   Pulse 76   Ht 5\' 3"  (1.6 m)   Wt 191 lb (86.6 kg)   SpO2 97%   BMI 33.83 kg/m    Subjective:   Patient ID: Caitlyn Boone, female    DOB: 28-May-1941, 83 y.o.   MRN: [id number]  HPI: Caitlyn Boone is a 83 y.o. female presenting on 08/01/2023 for Medical Management of Chronic Issues, Hyperlipidemia, and Hypertension   HPI Hypertension Patient is currently on lisinopril and hydrochlorothiazide and amlodipine, and their blood pressure today is 143/75. Patient denies any lightheadedness or dizziness. Patient denies headaches, blurred vision, chest pains, shortness of breath, or weakness. Denies any side effects from medication and is content with current medication.   Hyperlipidemia Patient is coming in for recheck of his hyperlipidemia. The patient is currently taking fish oils and pravastatin. They deny any issues with myalgias or history of liver damage from it. They deny any focal numbness or weakness or chest pain.   Hospital follow-up for right humerus fracture and fall Patient had surgery to repair a right humerus fracture and she was in the hospital from 2 9 until 07/22/2023.  During that hospitalization they did a CTA that also found that she had a vertebral artery compression that look like it was externally compressed and recommended vascular surgery.  They also found that she had T11 compression fracture that could have been old but they wanted her to follow-up with a neurosurgeon  Relevant past medical, surgical, family and social history reviewed and updated as indicated. Interim medical history since our last visit reviewed. Allergies and medications reviewed and updated.  Review of Systems  Constitutional:  Negative for chills and fever.  Eyes:  Negative for visual disturbance.  Respiratory:  Negative for cough, chest tightness, shortness of breath and wheezing.   Cardiovascular:  Positive for chest pain. Negative for leg swelling.  Genitourinary:  Negative  for difficulty urinating and dysuria.  Musculoskeletal:  Positive for arthralgias, back pain, gait problem and myalgias.  Skin:  Negative for rash.  Neurological:  Negative for light-headedness and headaches.  Psychiatric/Behavioral:  Negative for agitation and behavioral problems.   All other systems reviewed and are negative.   Per HPI unless specifically indicated above   Allergies as of 08/01/2023   No Known Allergies      Medication List        Accurate as of August 01, 2023  3:26 PM. If you have any questions, ask your nurse or doctor.          acetaminophen 500 MG tablet Commonly known as: TYLENOL Take 500 mg by mouth every 6 (six) hours as needed for moderate pain (pain score 4-6).   alendronate 70 MG tablet Commonly known as: FOSAMAX Take 1 tablet (70 mg total) by mouth every 7 (seven) days. Take with a full glass of water on an empty stomach.   allopurinol 300 MG tablet Commonly known as: ZYLOPRIM Take 1 tablet (300 mg total) by mouth daily.   amLODipine 5 MG tablet Commonly known as: NORVASC Take 1 tablet (5 mg total) by mouth daily.   aspirin 325 MG tablet Take 1 tablet by mouth daily. What changed: Another medication with the same name was removed. Continue taking this medication, and follow the directions you see here. Changed by: Elige Radon Maxton Noreen   Calcium Citrate 1040 MG Tabs Take by mouth.   ciclopirox 8 % solution Commonly known as: PENLAC Apply topically at bedtime. Apply over  nail and surrounding skin. Apply daily over previous coat. After seven (7) days, may remove with alcohol and continue cycle.   DULoxetine 30 MG capsule Commonly known as: CYMBALTA Take 1 capsule by mouth once daily   Fish Oil 1000 MG Caps Take 1 capsule by mouth.   furosemide 20 MG tablet Commonly known as: LASIX Take 1 tablet (20 mg total) by mouth daily as needed.   hydrochlorothiazide 12.5 MG capsule Commonly known as: MICROZIDE Take 1 capsule by mouth  once daily   lisinopril 40 MG tablet Commonly known as: ZESTRIL Take 1 tablet (40 mg total) by mouth daily.   loratadine 10 MG tablet Commonly known as: CLARITIN Take 10 mg by mouth daily.   meclizine 25 MG tablet Commonly known as: ANTIVERT Take 1 tablet (25 mg total) by mouth 3 (three) times daily as needed for dizziness.   multivitamin with minerals Tabs tablet Take 1 tablet by mouth daily.   omeprazole 20 MG capsule Commonly known as: PRILOSEC Take 1 capsule (20 mg total) by mouth daily.   pravastatin 80 MG tablet Commonly known as: PRAVACHOL Take 1 tablet (80 mg total) by mouth every evening.   Vitamin D 50 MCG (2000 UT) Caps Take 1 capsule by mouth daily.         Objective:   BP (!) 143/75   Pulse 76   Ht 5\' 3"  (1.6 m)   Wt 191 lb (86.6 kg)   SpO2 97%   BMI 33.83 kg/m   Wt Readings from Last 3 Encounters:  08/01/23 191 lb (86.6 kg)  01/26/23 192 lb (87.1 kg)  09/09/22 196 lb (88.9 kg)    Physical Exam Vitals and nursing note reviewed.  Constitutional:      General: She is not in acute distress.    Appearance: She is well-developed. She is not diaphoretic.  Eyes:     Conjunctiva/sclera: Conjunctivae normal.  Cardiovascular:     Rate and Rhythm: Normal rate and regular rhythm.     Heart sounds: Normal heart sounds. No murmur heard. Pulmonary:     Effort: Pulmonary effort is normal. No respiratory distress.     Breath sounds: Normal breath sounds. No wheezing.  Musculoskeletal:        General: No swelling or tenderness.     Comments: Right arm is in a splint and a sling.  Skin:    General: Skin is warm and dry.     Findings: No rash.  Neurological:     Mental Status: She is alert and oriented to person, place, and time.     Coordination: Coordination normal.  Psychiatric:        Behavior: Behavior normal.       Assessment & Plan:   Problem List Items Addressed This Visit       Cardiovascular and Mediastinum   Essential hypertension    Relevant Medications   aspirin 325 MG tablet   Other Relevant Orders   CMP14+EGFR   Lipid panel   Magnesium     Other   Hyperlipidemia - Primary   Relevant Medications   aspirin 325 MG tablet   Other Relevant Orders   CBC with Differential/Platelet   CMP14+EGFR   Lipid panel   Magnesium   Hypertriglyceridemia   Relevant Medications   aspirin 325 MG tablet   Other Relevant Orders   CMP14+EGFR   Lipid panel   Magnesium   Other Visit Diagnoses       Closed fracture of distal end  of right humerus with routine healing, unspecified fracture morphology, subsequent encounter         Vertebral artery narrowing, right       Relevant Medications   aspirin 325 MG tablet   Other Relevant Orders   Ambulatory referral to Vascular Surgery     Closed fracture of one rib of left side with routine healing, subsequent encounter         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  She already has orthopedic follow-up and they are going to get her set up with physical therapy.  Follow up plan: Return in about 6 months (around 01/29/2024), or if symptoms worsen or fail to improve, for Hypertension and cholesterol recheck.  Counseling provided for all of the vaccine components Orders Placed This Encounter  Procedures   CBC with Differential/Platelet   CMP14+EGFR   Lipid panel   Magnesium   Ambulatory referral to Neurosurgery   Ambulatory referral to Vascular Surgery    Arville Care, MD [facility] 08/01/2023, 3:26 PM

## 2023-08-02 LAB — CMP14+EGFR
ALT: 39 [IU]/L — ABNORMAL HIGH (ref 0–32)
AST: 57 [IU]/L — ABNORMAL HIGH (ref 0–40)
Albumin: 4.2 g/dL (ref 3.7–4.7)
Alkaline Phosphatase: 113 [IU]/L (ref 44–121)
BUN/Creatinine Ratio: 20 (ref 12–28)
BUN: 16 mg/dL (ref 8–27)
Bilirubin Total: <0.2 mg/dL (ref 0.0–1.2)
CO2: 26 mmol/L (ref 20–29)
Calcium: 10.7 mg/dL — ABNORMAL HIGH (ref 8.7–10.3)
Chloride: 99 mmol/L (ref 96–106)
Creatinine, Ser: 0.82 mg/dL (ref 0.57–1.00)
Globulin, Total: 3.4 g/dL (ref 1.5–4.5)
Glucose: 91 mg/dL (ref 70–99)
Potassium: 4.6 mmol/L (ref 3.5–5.2)
Sodium: 139 mmol/L (ref 134–144)
Total Protein: 7.6 g/dL (ref 6.0–8.5)
eGFR: 71 mL/min/{1.73_m2} (ref >59)

## 2023-08-02 LAB — LIPID PANEL
Chol/HDL Ratio: 4.1 {ratio} (ref 0.0–4.4)
Cholesterol, Total: 192 mg/dL (ref 100–199)
HDL: 47 mg/dL (ref >39)
LDL Chol Calc (NIH): 116 mg/dL — ABNORMAL HIGH (ref 0–99)
Triglycerides: 163 mg/dL — ABNORMAL HIGH (ref 0–149)
VLDL Cholesterol Cal: 29 mg/dL (ref 5–40)

## 2023-08-02 LAB — CBC WITH DIFFERENTIAL/PLATELET
Basophils Absolute: 0.1 10*3/uL (ref 0.0–0.2)
Basos: 1 %
EOS (ABSOLUTE): 0.3 10*3/uL (ref 0.0–0.4)
Eos: 3 %
Hematocrit: 39.3 % (ref 34.0–46.6)
Hemoglobin: 13.1 g/dL (ref 11.1–15.9)
Immature Grans (Abs): 0 10*3/uL (ref 0.0–0.1)
Immature Granulocytes: 0 %
Lymphocytes Absolute: 2.1 10*3/uL (ref 0.7–3.1)
Lymphs: 19 %
MCH: 31.4 pg (ref 26.6–33.0)
MCHC: 33.3 g/dL (ref 31.5–35.7)
MCV: 94 fL (ref 79–97)
Monocytes Absolute: 0.8 10*3/uL (ref 0.1–0.9)
Monocytes: 7 %
Neutrophils Absolute: 7.8 10*3/uL — ABNORMAL HIGH (ref 1.4–7.0)
Neutrophils: 70 %
Platelets: 549 10*3/uL — ABNORMAL HIGH (ref 150–450)
RBC: 4.17 x10E6/uL (ref 3.77–5.28)
RDW: 14 % (ref 11.7–15.4)
WBC: 11.1 10*3/uL — ABNORMAL HIGH (ref 3.4–10.8)

## 2023-08-02 LAB — MAGNESIUM: Magnesium: 2 mg/dL (ref 1.6–2.3)

## 2023-08-04 DIAGNOSIS — Z4789 Encounter for other orthopedic aftercare: Secondary | ICD-10-CM | POA: Diagnosis not present

## 2023-08-04 DIAGNOSIS — S42491D Other displaced fracture of lower end of right humerus, subsequent encounter for fracture with routine healing: Secondary | ICD-10-CM | POA: Diagnosis not present

## 2023-08-08 ENCOUNTER — Encounter: Payer: Self-pay | Admitting: Family Medicine

## 2023-08-08 DIAGNOSIS — Z4789 Encounter for other orthopedic aftercare: Secondary | ICD-10-CM | POA: Diagnosis not present

## 2023-08-08 DIAGNOSIS — S42491A Other displaced fracture of lower end of right humerus, initial encounter for closed fracture: Secondary | ICD-10-CM | POA: Diagnosis not present

## 2023-08-10 ENCOUNTER — Ambulatory Visit: Payer: Medicare Other

## 2023-08-10 ENCOUNTER — Encounter (HOSPITAL_COMMUNITY): Payer: Self-pay | Admitting: Occupational Therapy

## 2023-08-10 ENCOUNTER — Ambulatory Visit (HOSPITAL_COMMUNITY): Attending: Physician Assistant | Admitting: Occupational Therapy

## 2023-08-10 ENCOUNTER — Other Ambulatory Visit: Payer: Self-pay

## 2023-08-10 DIAGNOSIS — R29898 Other symptoms and signs involving the musculoskeletal system: Secondary | ICD-10-CM | POA: Insufficient documentation

## 2023-08-10 DIAGNOSIS — R278 Other lack of coordination: Secondary | ICD-10-CM | POA: Insufficient documentation

## 2023-08-10 DIAGNOSIS — M25621 Stiffness of right elbow, not elsewhere classified: Secondary | ICD-10-CM | POA: Diagnosis not present

## 2023-08-10 DIAGNOSIS — M25521 Pain in right elbow: Secondary | ICD-10-CM | POA: Insufficient documentation

## 2023-08-10 NOTE — Patient Instructions (Signed)
 AROM Exercises  Complete exercises 10 times each, 5 times per day.  1) Elbow flexion and extension Bend your elbow upwards as shown and then lower to a straighten position.     2) Forearm supination and pronation Hold elbow at a right angle stabilizing on a table or armrest. Keep elbow at side and turn palm up and down.        3) Wrist Flexion  Start with wrist at edge of table, palm facing up. With wrist hanging slightly off table, curl wrist upward, and back down.      4) Wrist Extension  Start with wrist at edge of table, palm facing down. With wrist slightly off the edge of the table, curl wrist up and back down.      5) Radial Deviations  Start with forearm flat against a table, wrist hanging slightly off the edge, and palm facing the wall. Bending at the wrist only, and keeping palm facing the wall, bend wrist so fist is pointing towards the floor, back up to start position, and up towards the ceiling. Return to start.

## 2023-08-10 NOTE — Therapy (Signed)
 OUTPATIENT OCCUPATIONAL THERAPY ORTHO EVALUATION  Patient Name: Caitlyn Boone MRN: [id number] DOB:1941-02-23, 83 y.o., female Today's Date: 08/10/2023    END OF SESSION:  OT End of Session - 08/10/23 1303     Visit Number 1    Number of Visits 16    Date for OT Re-Evaluation 10/09/23   progress note 09/09/23   Authorization Type [employer] Medicare, $20 copay    Authorization Time Period requesting 16 visits    Authorization - Visit Number 0    Authorization - Number of Visits 16    Progress Note Due on Visit 10    OT Start Time 1100    OT Stop Time 1143    OT Time Calculation (min) 43 min    Activity Tolerance Patient tolerated treatment well    Behavior During Therapy WFL for tasks assessed/performed             Past Medical History:  Diagnosis Date   Compression fracture 05/24/2013   L1   GERD (gastroesophageal reflux disease)    Gout    Hyperlipidemia    Hypertension    Knee fracture, left feb 2015   no surgery done   Osteoporosis    Past Surgical History:  Procedure Laterality Date   ABDOMINAL HYSTERECTOMY  1992   complete   CHOLECYSTECTOMY N/A 06/28/2014   Procedure: LAPAROSCOPIC CHOLECYSTECTOMY ;  Surgeon: Claud Kelp, MD;  Location: [facility];  Service: General;  Laterality: N/A;   EYE SURGERY Bilateral 2013   lens for cataracts   LAMINECTOMY  1987   L5-S1   TONSILLECTOMY  1961   Patient Active Problem List   Diagnosis Date Noted   Anxiety 01/26/2023   Hypertriglyceridemia 08/18/2022   Elevated blood sugar 08/18/2022   Closed nondisplaced fracture of left patella 03/09/2018   Obesity (BMI 30.0-34.9) 02/16/2016   Gallstones 06/28/2014   Hyperlipidemia 02/19/2014   Osteoporosis 02/19/2014   Essential hypertension 02/19/2014   GERD (gastroesophageal reflux disease) 02/19/2014   Gout 02/19/2014   PCP: Dr. Ivin Booty Dettinger REFERRING PROVIDER: Malachy Mood, PA-C  ONSET DATE: 07/21/23  REFERRING DIAG: s/p ORIF comminuted articular right distal humerus  fracture  THERAPY DIAG:  Pain in right elbow  Stiffness of right elbow, not elsewhere classified  Other symptoms and signs involving the musculoskeletal system  Other lack of coordination  Rationale for Evaluation and Treatment: Rehabilitation  SUBJECTIVE:   SUBJECTIVE STATEMENT: S: I've been wearing this brace all the time.  Pt accompanied by: family member and step-son (caregiver)  PERTINENT HISTORY: Pt is an 83 y/o female s/p ORIF comminuted articular right distal humerus fracture on 07/21/23. Pt sustained injury when her foot caught on the threshold between 2 rooms and she fell. Pt presents in posterior long arm splint and sling today. Step-son present as caregiver.   PRECAUTIONS: Other: Strictly NWB; ok to begin A/ROM at 2 weeks, wear splint until follow up with MD at 6 weeks; follow [place] Handbook Protocol    WEIGHT BEARING RESTRICTIONS: Yes NWB  PAIN:  Are you having pain? No  FALLS: Has patient fallen in last 6 months? Yes. Number of falls 1  PLOF: Independent  PATIENT GOALS: To be able to use the RUE.   NEXT MD VISIT: 08/29/23  OBJECTIVE:  Note: Objective measures were completed at Evaluation unless otherwise noted.  HAND DOMINANCE: Right  ADLs: Pt is unable to use the dominant RUE for any ADLs, cannot reach overhead or behind back. Pt with min difficulty sleeping.  FUNCTIONAL OUTCOME MEASURES: Upper Extremity Functional Scale (UEFS): 16/80  UPPER EXTREMITY ROM:       Eval: Assessed in sitting, unable to assess P/ROM due to protocol  Active ROM Right eval  Shoulder flexion 69  Shoulder abduction 56  Shoulder internal rotation 90  Shoulder external rotation 19  Elbow flexion 115  Elbow extension 36  Wrist flexion 32  Wrist extension 28  Wrist ulnar deviation 40  Wrist radial deviation 10  Wrist pronation 90  Wrist supination 40  (Blank rows = not tested)   UPPER EXTREMITY MMT:       Eval: Assessed on observation due to precautions  limiting formal MMT  MMT Right eval  Shoulder flexion 3-/5  Shoulder abduction 3-/5  Shoulder internal rotation 3/5  Shoulder external rotation 3-/5  Elbow flexion 3-/5  Elbow extension 3-/5  Wrist flexion 3/5  Wrist extension 3/5  Wrist ulnar deviation 3/5  Wrist radial deviation 3/5  Wrist pronation 3/5  Wrist supination 3-/5  (Blank rows = not tested)  HAND FUNCTION: Grip strength: Right: 5 lbs; Left: 40 lbs  SENSATION: Tingling in fingers along ulnar nerve distribution  EDEMA:       Right:    Left:  Wrist     15.75cm  15.0cm Mid forearm(10cm from wrist)  16.5cm  16.0cm Elbow    27cm   24cm Mid upper arm (10cm from elbow) 30.5cm  27.5cm    COGNITION: Overall cognitive status: Within functional limits for tasks assessed  OBSERVATIONS: moderate edema and fascial restrictions along entire RUE, min edema in fingers   TREATMENT DATE:  08/10/23                                                                                                                           -A/ROM: elbow flexion/extension, wrist flexion/extension, ulnar/radial deviation, forearm supination/pronation    PATIENT EDUCATION: Education details: elbow and wrist A/ROM Person educated: Patient and Caregiver step-son Education method: Explanation, Demonstration, and Handouts Education comprehension: verbalized understanding and returned demonstration  HOME EXERCISE PROGRAM: Eval: elbow and wrist A/ROM  GOALS: Goals reviewed with patient? Yes  SHORT TERM GOALS: Target date: 09/09/23  Pt will be provided with and educated on HEP to improve mobility in RUE required for use during ADL completion.   Goal status: INITIAL  2.  Pt will increase RUE A/ROM by 15+ degrees to improve ability to use RUE during dressing tasks with minimal compensatory techniques.   Goal status: INITIAL  3.  Pt will increase RUE strength to 3+/5 to improve ability to reach for items at waist to chest height during bathing  and grooming tasks.   Goal status: INITIAL  4.  Pt will demonstrate decreased edema by at least 25% to improve ability to perform joint mobility tasks and improve functional use of the RUE during grooming tasks.    Goal status: INITIAL    LONG TERM GOALS: Target date: 10/09/23  Pt will decrease pain in  RUE to 3/10 or less to improve ability to perform HEP at recommended frequency and duration to improve mobility of RUE required for ADL completion.   Goal status: INITIAL  2.  Pt will decrease RUE fascial restrictions to min amounts or less to improve mobility required for functional reaching tasks.   Goal status: INITIAL  3.  Pt will increase RUE A/ROM by 30+ degrees to improve ability to use RUE when reaching overhead or behind back during dressing and bathing tasks.   Goal status: INITIAL  4.  Pt will increase RUE strength to 4/5 or greater to improve ability to use RUE when lifting or carrying items during meal preparation/housework/yardwork tasks.   Goal status: INITIAL   5.  Pt will increase RUE grip strength by 30# or greater to improve ability to use RUE when grasping and manipulating items for meal preparation or housework.   Goal status: INITIAL    ASSESSMENT:  CLINICAL IMPRESSION: Patient is an 83 y.o. female who was seen today for occupational therapy evaluation s/p ORIF comminuted articular right distal humerus fracture on 07/21/23. Pt presents with custom fabricated posterior long arm splint and sling. Educated on findings and POC, as well as expected healing and function at this point since surgery. Pt with steri-strips and bandage along posterior elbow due to skin tearing. Re-wrapped pt at end of evaluation. Pt provided with A/ROM exercises for HEP per protocol. Discussed strict NWB precautions for home.   PERFORMANCE DEFICITS: in functional skills including ADLs, IADLs, coordination, dexterity, proprioception, sensation, edema, ROM, strength, pain, fascial  restrictions, muscle spasms, flexibility, UE functional use, and vestibular  IMPAIRMENTS: are limiting patient from ADLs, IADLs, rest and sleep, leisure, and social participation.   COMORBIDITIES: has no other co-morbidities that affects occupational performance. Patient will benefit from skilled OT to address above impairments and improve overall function.  MODIFICATION OR ASSISTANCE TO COMPLETE EVALUATION: No modification of tasks or assist necessary to complete an evaluation.  OT OCCUPATIONAL PROFILE AND HISTORY: Problem focused assessment: Including review of records relating to presenting problem.  CLINICAL DECISION MAKING: LOW - limited treatment options, no task modification necessary  REHAB POTENTIAL: Good  EVALUATION COMPLEXITY: Low      PLAN:  OT FREQUENCY: 2x/week  OT DURATION: 8 weeks  PLANNED INTERVENTIONS: 97168 OT Re-evaluation, 97535 self care/ADL training, 40981 therapeutic exercise, 97530 therapeutic activity, 97112 neuromuscular re-education, 97140 manual therapy, 97035 ultrasound, 97018 paraffin, 19147 electrical stimulation unattended, patient/family education, and DME and/or AE instructions  RECOMMENDED OTHER SERVICES: None at this time  CONSULTED AND AGREED WITH PLAN OF CARE: Patient  PLAN FOR NEXT SESSION: Follow up on HEP, initiate manual techniques to address edema and fascial restrictions, follow protocol for A/ROM   Ezra Sites, OTR/L  [phone] 08/10/2023, 1:06 PM   [employer] Medicare Auth Request Information  Date of referral: 08/08/23 Referring provider: Eduard Roux, PA-C Referring diagnosis (ICD 10)? M57.846N Treatment diagnosis (ICD 10)? (if different than referring diagnosis) M25.521, M25.621, R29.898, R27.8  Functional Tool Score: UEFS 16/80  What was this (referring dx) caused by? Surgery (Type: ORIF)  Nature of Condition: Initial Onset (within last 3 months)   Laterality: Rt  Current Functional Measure Score: Other UEFS  16/80  Objective measurements identify impairments when they are compared to normal values, the uninvolved extremity, and prior level of function.  [x]  Yes  []  No  Objective assessment of functional ability: Moderate functional limitations   Briefly describe symptoms: pain, stiffness, decreased ROM and strength in the RUE,  moderate edema and fascial restriction along RUE, decreased functional use  How did symptoms start: fall then surgery  Average pain intensity:  Last 24 hours: 5/10  Past week: 5/10  How often does the pt experience symptoms? Constantly  How much have the symptoms interfered with usual daily activities? Quite a bit  How has condition changed since care began at this facility? NA - initial visit  In general, how is the patients overall health? Good   BACK PAIN (STarT Back Screening Tool) No

## 2023-08-12 ENCOUNTER — Encounter (HOSPITAL_COMMUNITY): Payer: Self-pay | Admitting: Occupational Therapy

## 2023-08-12 ENCOUNTER — Ambulatory Visit (HOSPITAL_COMMUNITY): Admitting: Occupational Therapy

## 2023-08-12 DIAGNOSIS — M25521 Pain in right elbow: Secondary | ICD-10-CM

## 2023-08-12 DIAGNOSIS — R278 Other lack of coordination: Secondary | ICD-10-CM | POA: Diagnosis not present

## 2023-08-12 DIAGNOSIS — M25621 Stiffness of right elbow, not elsewhere classified: Secondary | ICD-10-CM | POA: Diagnosis not present

## 2023-08-12 DIAGNOSIS — R29898 Other symptoms and signs involving the musculoskeletal system: Secondary | ICD-10-CM

## 2023-08-12 NOTE — Therapy (Signed)
 OUTPATIENT OCCUPATIONAL THERAPY ORTHO TREATMENT  Patient Name: Caitlyn Boone MRN: [id number] DOB:1941-03-23, 83 y.o., female Today's Date: 08/12/2023    END OF SESSION:  OT End of Session - 08/12/23 1207     Visit Number 2    Number of Visits 16    Date for OT Re-Evaluation 10/09/23   progress note 09/09/23   Authorization Type [employer] Medicare, $20 copay    Authorization Time Period requesting 16 visits    Authorization - Number of Visits 16    Progress Note Due on Visit 10    OT Start Time 1105    OT Stop Time 1156    OT Time Calculation (min) 51 min    Activity Tolerance Patient tolerated treatment well    Behavior During Therapy WFL for tasks assessed/performed              Past Medical History:  Diagnosis Date   Compression fracture 05/24/2013   L1   GERD (gastroesophageal reflux disease)    Gout    Hyperlipidemia    Hypertension    Knee fracture, left feb 2015   no surgery done   Osteoporosis    Past Surgical History:  Procedure Laterality Date   ABDOMINAL HYSTERECTOMY  1992   complete   CHOLECYSTECTOMY N/A 06/28/2014   Procedure: LAPAROSCOPIC CHOLECYSTECTOMY ;  Surgeon: Claud Kelp, MD;  Location: [facility];  Service: General;  Laterality: N/A;   EYE SURGERY Bilateral 2013   lens for cataracts   LAMINECTOMY  1987   L5-S1   TONSILLECTOMY  1961   Patient Active Problem List   Diagnosis Date Noted   Anxiety 01/26/2023   Hypertriglyceridemia 08/18/2022   Elevated blood sugar 08/18/2022   Closed nondisplaced fracture of left patella 03/09/2018   Obesity (BMI 30.0-34.9) 02/16/2016   Gallstones 06/28/2014   Hyperlipidemia 02/19/2014   Osteoporosis 02/19/2014   Essential hypertension 02/19/2014   GERD (gastroesophageal reflux disease) 02/19/2014   Gout 02/19/2014   PCP: Dr. Ivin Booty Dettinger REFERRING PROVIDER: Malachy Mood, PA-C  ONSET DATE: 07/21/23  REFERRING DIAG: s/p ORIF comminuted articular right distal humerus fracture  THERAPY DIAG:  Pain in  right elbow  Stiffness of right elbow, not elsewhere classified  Other lack of coordination  Other symptoms and signs involving the musculoskeletal system  Rationale for Evaluation and Treatment: Rehabilitation  SUBJECTIVE:   SUBJECTIVE STATEMENT: S: It's feeling good. Pt accompanied by: family member and step-son (caregiver)  PERTINENT HISTORY: Pt is an 83 y/o female s/p ORIF comminuted articular right distal humerus fracture on 07/21/23. Pt sustained injury when her foot caught on the threshold between 2 rooms and she fell. Pt presents in posterior long arm splint and sling today. Step-son present as caregiver.   PRECAUTIONS: Other: Strictly NWB; ok to begin A/ROM at 2 weeks, wear splint until follow up with MD at 6 weeks; follow [place] Handbook Protocol    WEIGHT BEARING RESTRICTIONS: Yes NWB  PAIN:  Are you having pain? No  FALLS: Has patient fallen in last 6 months? Yes. Number of falls 1  PLOF: Independent  PATIENT GOALS: To be able to use the RUE.   NEXT MD VISIT: 08/29/23  OBJECTIVE:  Note: Objective measures were completed at Evaluation unless otherwise noted.  HAND DOMINANCE: Right  ADLs: Pt is unable to use the dominant RUE for any ADLs, cannot reach overhead or behind back. Pt with min difficulty sleeping.    FUNCTIONAL OUTCOME MEASURES: Upper Extremity Functional Scale (UEFS): 16/80  UPPER EXTREMITY ROM:  Eval: Assessed in sitting, unable to assess P/ROM due to protocol  Active ROM Right eval  Shoulder flexion 69  Shoulder abduction 56  Shoulder internal rotation 90  Shoulder external rotation 19  Elbow flexion 115  Elbow extension 36  Wrist flexion 32  Wrist extension 28  Wrist ulnar deviation 40  Wrist radial deviation 10  Wrist pronation 90  Wrist supination 40  (Blank rows = not tested)   UPPER EXTREMITY MMT:       Eval: Assessed on observation due to precautions limiting formal MMT  MMT Right eval  Shoulder flexion 3-/5   Shoulder abduction 3-/5  Shoulder internal rotation 3/5  Shoulder external rotation 3-/5  Elbow flexion 3-/5  Elbow extension 3-/5  Wrist flexion 3/5  Wrist extension 3/5  Wrist ulnar deviation 3/5  Wrist radial deviation 3/5  Wrist pronation 3/5  Wrist supination 3-/5  (Blank rows = not tested)  HAND FUNCTION: Grip strength: Right: 5 lbs; Left: 40 lbs  SENSATION: Tingling in fingers along ulnar nerve distribution  EDEMA:       Right:    Left:  Wrist     15.75cm  15.0cm Mid forearm(10cm from wrist)  16.5cm  16.0cm Elbow    27cm   24cm Mid upper arm (10cm from elbow) 30.5cm  27.5cm    OBSERVATIONS: moderate edema and fascial restrictions along entire RUE, min edema in fingers   TREATMENT DATE:  08/12/23 -Retrograde massage to right hand, wrist, forearm, volar elbow, upper arm, and trapezius regions to decrease edema and improve joint ROM -P/ROM: digit flexion, wrist flexion/extension, shoulder flexion, abduction, 10 reps -A/ROM: elbow flexion/extension, forearm supination/pronation, wrist flexion/extension, ulnar/radial deviation, 10 reps -Table slides: flexion, 10 reps -Digit composite flexion/extension, 10 reps  08/10/23                                                                                                                           -A/ROM: elbow flexion/extension, wrist flexion/extension, ulnar/radial deviation, forearm supination/pronation    PATIENT EDUCATION: Education details: elbow and wrist A/ROM Person educated: Patient and Caregiver step-son Education method: Explanation, Demonstration, and Handouts Education comprehension: verbalized understanding and returned demonstration  HOME EXERCISE PROGRAM: Eval: elbow and wrist A/ROM   GOALS: Goals reviewed with patient? Yes  SHORT TERM GOALS: Target date: 09/09/23  Pt will be provided with and educated on HEP to improve mobility in RUE required for use during ADL completion.   Goal status: IN  PROGRESS  2.  Pt will increase RUE A/ROM by 15+ degrees to improve ability to use RUE during dressing tasks with minimal compensatory techniques.   Goal status: IN PROGRESS  3.  Pt will increase RUE strength to 3+/5 to improve ability to reach for items at waist to chest height during bathing and grooming tasks.   Goal status: IN PROGRESS  4.  Pt will demonstrate decreased edema by at least 25% to improve ability to perform joint mobility tasks and improve functional  use of the RUE during grooming tasks.    Goal status: IN PROGRESS    LONG TERM GOALS: Target date: 10/09/23  Pt will decrease pain in RUE to 3/10 or less to improve ability to perform HEP at recommended frequency and duration to improve mobility of RUE required for ADL completion.   Goal status: IN PROGRESS  2.  Pt will decrease RUE fascial restrictions to min amounts or less to improve mobility required for functional reaching tasks.   Goal status: IN PROGRESS  3.  Pt will increase RUE A/ROM by 30+ degrees to improve ability to use RUE when reaching overhead or behind back during dressing and bathing tasks.   Goal status: IN PROGRESS  4.  Pt will increase RUE strength to 4/5 or greater to improve ability to use RUE when lifting or carrying items during meal preparation/housework/yardwork tasks.   Goal status: IN PROGRESS   5.  Pt will increase RUE grip strength by 30# or greater to improve ability to use RUE when grasping and manipulating items for meal preparation or housework.   Goal status: IN PROGRESS    ASSESSMENT:  CLINICAL IMPRESSION: Pt reports her HEP is going well. Initiated retrograde massage to address edema along the RUE, great vasomotor response to edema management. P/ROM to digits, wrist, and shoulder, not elbow per protocol. After passive stretching pt able to make a full fist. Pt completing A/ROM tasks, table slides for shoulder mobility. Re-wrapped elbow at incision, very minimal  drainage/scabbing on removal of bandage pt wore to therapy today. Verbal and tactile cuing for form and positioning today.   PERFORMANCE DEFICITS: in functional skills including ADLs, IADLs, coordination, dexterity, proprioception, sensation, edema, ROM, strength, pain, fascial restrictions, muscle spasms, flexibility, UE functional use, and vestibular     PLAN:  OT FREQUENCY: 2x/week  OT DURATION: 8 weeks  PLANNED INTERVENTIONS: 97168 OT Re-evaluation, 97535 self care/ADL training, 16109 therapeutic exercise, 97530 therapeutic activity, 97112 neuromuscular re-education, 97140 manual therapy, 97035 ultrasound, 97018 paraffin, 60454 electrical stimulation unattended, patient/family education, and DME and/or AE instructions  CONSULTED AND AGREED WITH PLAN OF CARE: Patient  PLAN FOR NEXT SESSION: Follow up on HEP, continue manual techniques to address edema and fascial restrictions, follow protocol for A/ROM   Ezra Sites, OTR/L  [phone] 08/12/2023, 8:11 PM

## 2023-08-15 DIAGNOSIS — Z4789 Encounter for other orthopedic aftercare: Secondary | ICD-10-CM | POA: Diagnosis not present

## 2023-08-15 DIAGNOSIS — S42491A Other displaced fracture of lower end of right humerus, initial encounter for closed fracture: Secondary | ICD-10-CM | POA: Diagnosis not present

## 2023-08-16 ENCOUNTER — Ambulatory Visit (HOSPITAL_COMMUNITY): Admitting: Occupational Therapy

## 2023-08-16 ENCOUNTER — Encounter (HOSPITAL_COMMUNITY): Payer: Self-pay | Admitting: Occupational Therapy

## 2023-08-16 DIAGNOSIS — R278 Other lack of coordination: Secondary | ICD-10-CM

## 2023-08-16 DIAGNOSIS — M25521 Pain in right elbow: Secondary | ICD-10-CM

## 2023-08-16 DIAGNOSIS — M25621 Stiffness of right elbow, not elsewhere classified: Secondary | ICD-10-CM

## 2023-08-16 DIAGNOSIS — R29898 Other symptoms and signs involving the musculoskeletal system: Secondary | ICD-10-CM

## 2023-08-16 NOTE — Therapy (Signed)
 OUTPATIENT OCCUPATIONAL THERAPY ORTHO TREATMENT  Patient Name: Caitlyn Boone MRN: [id number] DOB:January 17, 1941, 83 y.o., female Today's Date: 08/16/2023    END OF SESSION:  OT End of Session - 08/16/23 1353     Visit Number 3    Number of Visits 16    Date for OT Re-Evaluation 10/09/23   progress note 09/09/23   Authorization Type [employer] Medicare, $20 copay    Authorization Time Period requesting 16 visits    Authorization - Number of Visits 16    Progress Note Due on Visit 10    OT Start Time 1300    OT Stop Time 1345    OT Time Calculation (min) 45 min    Activity Tolerance Patient tolerated treatment well    Behavior During Therapy WFL for tasks assessed/performed               Past Medical History:  Diagnosis Date   Compression fracture 05/24/2013   L1   GERD (gastroesophageal reflux disease)    Gout    Hyperlipidemia    Hypertension    Knee fracture, left feb 2015   no surgery done   Osteoporosis    Past Surgical History:  Procedure Laterality Date   ABDOMINAL HYSTERECTOMY  1992   complete   CHOLECYSTECTOMY N/A 06/28/2014   Procedure: LAPAROSCOPIC CHOLECYSTECTOMY ;  Surgeon: Claud Kelp, MD;  Location: [facility];  Service: General;  Laterality: N/A;   EYE SURGERY Bilateral 2013   lens for cataracts   LAMINECTOMY  1987   L5-S1   TONSILLECTOMY  1961   Patient Active Problem List   Diagnosis Date Noted   Anxiety 01/26/2023   Hypertriglyceridemia 08/18/2022   Elevated blood sugar 08/18/2022   Closed nondisplaced fracture of left patella 03/09/2018   Obesity (BMI 30.0-34.9) 02/16/2016   Gallstones 06/28/2014   Hyperlipidemia 02/19/2014   Osteoporosis 02/19/2014   Essential hypertension 02/19/2014   GERD (gastroesophageal reflux disease) 02/19/2014   Gout 02/19/2014   PCP: Dr. Ivin Booty Dettinger REFERRING PROVIDER: Malachy Mood, PA-C  ONSET DATE: 07/21/23  REFERRING DIAG: s/p ORIF comminuted articular right distal humerus fracture  THERAPY DIAG:  Pain in  right elbow  Stiffness of right elbow, not elsewhere classified  Other lack of coordination  Other symptoms and signs involving the musculoskeletal system  Rationale for Evaluation and Treatment: Rehabilitation  SUBJECTIVE:   SUBJECTIVE STATEMENT: S: I woke up hurting last night and took a pain pill.  Pt accompanied by: family member and step-son (caregiver)  PERTINENT HISTORY: Pt is an 83 y/o female s/p ORIF comminuted articular right distal humerus fracture on 07/21/23. Pt sustained injury when her foot caught on the threshold between 2 rooms and she fell. Pt presents in posterior long arm splint and sling today. Step-son present as caregiver.   PRECAUTIONS: Other: Strictly NWB; ok to begin A/ROM at 2 weeks, wear splint until follow up with MD at 6 weeks; follow [place] Handbook Protocol    WEIGHT BEARING RESTRICTIONS: Yes NWB  PAIN:  Are you having pain? No  FALLS: Has patient fallen in last 6 months? Yes. Number of falls 1  PLOF: Independent  PATIENT GOALS: To be able to use the RUE.   NEXT MD VISIT: 08/29/23  OBJECTIVE:  Note: Objective measures were completed at Evaluation unless otherwise noted.  HAND DOMINANCE: Right  ADLs: Pt is unable to use the dominant RUE for any ADLs, cannot reach overhead or behind back. Pt with min difficulty sleeping.    FUNCTIONAL OUTCOME MEASURES:  Upper Extremity Functional Scale (UEFS): 16/80  UPPER EXTREMITY ROM:       Eval: Assessed in sitting, unable to assess P/ROM due to protocol  Active ROM Right eval  Shoulder flexion 69  Shoulder abduction 56  Shoulder internal rotation 90  Shoulder external rotation 19  Elbow flexion 115  Elbow extension 36  Wrist flexion 32  Wrist extension 28  Wrist ulnar deviation 40  Wrist radial deviation 10  Wrist pronation 90  Wrist supination 40  (Blank rows = not tested)   UPPER EXTREMITY MMT:       Eval: Assessed on observation due to precautions limiting formal MMT  MMT  Right eval  Shoulder flexion 3-/5  Shoulder abduction 3-/5  Shoulder internal rotation 3/5  Shoulder external rotation 3-/5  Elbow flexion 3-/5  Elbow extension 3-/5  Wrist flexion 3/5  Wrist extension 3/5  Wrist ulnar deviation 3/5  Wrist radial deviation 3/5  Wrist pronation 3/5  Wrist supination 3-/5  (Blank rows = not tested)  HAND FUNCTION: Grip strength: Right: 5 lbs; Left: 40 lbs  SENSATION: Tingling in fingers along ulnar nerve distribution  EDEMA:       Right:    Left:  Wrist     15.75cm  15.0cm Mid forearm(10cm from wrist)  16.5cm  16.0cm Elbow    27cm   24cm Mid upper arm (10cm from elbow) 30.5cm  27.5cm    OBSERVATIONS: moderate edema and fascial restrictions along entire RUE, min edema in fingers   TREATMENT DATE:  08/16/23 -Retrograde massage to right hand, wrist, forearm, volar elbow, upper arm, and trapezius regions to decrease edema and improve joint ROM -P/ROM: digit flexion, wrist flexion/extension, shoulder flexion, abduction, 10 reps -A/ROM: supine-elbow flexion, extension, 10 reps -A/ROM: elbow flexion/extension, forearm supination/pronation, wrist flexion/extension, ulnar/radial deviation, 10 reps -Provided XS edema glove for hand and wrist -Scapular A/ROM: elevation/depression, extension, 10 reps -Table slides: flexion, 10 reps -Digit composite flexion/extension, 10 reps  08/12/23 -Retrograde massage to right hand, wrist, forearm, volar elbow, upper arm, and trapezius regions to decrease edema and improve joint ROM -P/ROM: digit flexion, wrist flexion/extension, shoulder flexion, abduction, 10 reps -A/ROM: elbow flexion/extension, forearm supination/pronation, wrist flexion/extension, ulnar/radial deviation, 10 reps -Table slides: flexion, 10 reps -Digit composite flexion/extension, 10 reps  08/10/23                                                                                                                           -A/ROM: elbow  flexion/extension, wrist flexion/extension, ulnar/radial deviation, forearm supination/pronation    PATIENT EDUCATION: Education details: edema glove wear Person educated: Patient and Caregiver step-son Education method: Explanation, Demonstration, and Handouts Education comprehension: verbalized understanding and returned demonstration  HOME EXERCISE PROGRAM: Eval: elbow and wrist A/ROM   GOALS: Goals reviewed with patient? Yes  SHORT TERM GOALS: Target date: 09/09/23  Pt will be provided with and educated on HEP to improve mobility in RUE required for use during ADL completion.   Goal  status: IN PROGRESS  2.  Pt will increase RUE A/ROM by 15+ degrees to improve ability to use RUE during dressing tasks with minimal compensatory techniques.   Goal status: IN PROGRESS  3.  Pt will increase RUE strength to 3+/5 to improve ability to reach for items at waist to chest height during bathing and grooming tasks.   Goal status: IN PROGRESS  4.  Pt will demonstrate decreased edema by at least 25% to improve ability to perform joint mobility tasks and improve functional use of the RUE during grooming tasks.    Goal status: IN PROGRESS    LONG TERM GOALS: Target date: 10/09/23  Pt will decrease pain in RUE to 3/10 or less to improve ability to perform HEP at recommended frequency and duration to improve mobility of RUE required for ADL completion.   Goal status: IN PROGRESS  2.  Pt will decrease RUE fascial restrictions to min amounts or less to improve mobility required for functional reaching tasks.   Goal status: IN PROGRESS  3.  Pt will increase RUE A/ROM by 30+ degrees to improve ability to use RUE when reaching overhead or behind back during dressing and bathing tasks.   Goal status: IN PROGRESS  4.  Pt will increase RUE strength to 4/5 or greater to improve ability to use RUE when lifting or carrying items during meal preparation/housework/yardwork tasks.   Goal status:  IN PROGRESS   5.  Pt will increase RUE grip strength by 30# or greater to improve ability to use RUE when grasping and manipulating items for meal preparation or housework.   Goal status: IN PROGRESS    ASSESSMENT:  CLINICAL IMPRESSION: Pt reports her HEP is going well. MD is pleased with edema and ROM, has a follow up on Monday but if continues to have no additional drainage might cancel that appt. Continued with manual techniques for edema control and provided XS edema glove today. Added scapular A/ROM today, pt stabilizing balance at countertop with LUE. Rewrapped elbow prior to donning splint, removed 1 layer of padding for improved splint fit, as pt woke up in a lot of pain this morning.   PERFORMANCE DEFICITS: in functional skills including ADLs, IADLs, coordination, dexterity, proprioception, sensation, edema, ROM, strength, pain, fascial restrictions, muscle spasms, flexibility, UE functional use, and vestibular     PLAN:  OT FREQUENCY: 2x/week  OT DURATION: 8 weeks  PLANNED INTERVENTIONS: 97168 OT Re-evaluation, 97535 self care/ADL training, 19147 therapeutic exercise, 97530 therapeutic activity, 97112 neuromuscular re-education, 97140 manual therapy, 97035 ultrasound, 97018 paraffin, 82956 electrical stimulation unattended, patient/family education, and DME and/or AE instructions  CONSULTED AND AGREED WITH PLAN OF CARE: Patient  PLAN FOR NEXT SESSION: Follow up on HEP, continue manual techniques to address edema and fascial restrictions, follow protocol for A/ROM   Ezra Sites, OTR/L  [phone] 08/16/2023, 1:53 PM

## 2023-08-18 ENCOUNTER — Encounter (HOSPITAL_COMMUNITY): Payer: Self-pay | Admitting: Occupational Therapy

## 2023-08-18 ENCOUNTER — Ambulatory Visit (HOSPITAL_COMMUNITY): Admitting: Occupational Therapy

## 2023-08-18 DIAGNOSIS — R29898 Other symptoms and signs involving the musculoskeletal system: Secondary | ICD-10-CM

## 2023-08-18 DIAGNOSIS — M25621 Stiffness of right elbow, not elsewhere classified: Secondary | ICD-10-CM

## 2023-08-18 DIAGNOSIS — M25521 Pain in right elbow: Secondary | ICD-10-CM | POA: Diagnosis not present

## 2023-08-18 DIAGNOSIS — R278 Other lack of coordination: Secondary | ICD-10-CM | POA: Diagnosis not present

## 2023-08-18 NOTE — Patient Instructions (Signed)
 1) SHOULDER: Flexion On Table   Place hands on towel placed on table, elbows straight. Lean forward with you upper body, pushing towel away from body.  10 reps per set, 3 sets per day  2) Abduction (Passive)   With arm out to side, resting on towel placed on table with palm DOWN, keeping trunk away from table, lean to the side while pushing towel away from body.  Repeat 10 times. Do 3 sessions per day.  Copyright  [employer]. All rights reserved.

## 2023-08-18 NOTE — Therapy (Signed)
 OUTPATIENT OCCUPATIONAL THERAPY ORTHO TREATMENT  Patient Name: Caitlyn Boone MRN: [id number] DOB:1941/04/19, 83 y.o., female Today's Date: 08/18/2023    END OF SESSION:  OT End of Session - 08/18/23 1152     Visit Number 4    Number of Visits 16    Date for OT Re-Evaluation 10/09/23   progress note 09/09/23   Authorization Type [employer] Medicare, $20 copay    Authorization Time Period 16 visits approved 08/10/23-10/05/23    Authorization - Visit Number 4    Authorization - Number of Visits 16    Progress Note Due on Visit 10    OT Start Time 1100    OT Stop Time 1145    OT Time Calculation (min) 45 min    Activity Tolerance Patient tolerated treatment well    Behavior During Therapy [facility] for tasks assessed/performed                Past Medical History:  Diagnosis Date   Compression fracture 05/24/2013   L1   GERD (gastroesophageal reflux disease)    Gout    Hyperlipidemia    Hypertension    Knee fracture, left feb 2015   no surgery done   Osteoporosis    Past Surgical History:  Procedure Laterality Date   ABDOMINAL HYSTERECTOMY  1992   complete   CHOLECYSTECTOMY N/A 06/28/2014   Procedure: LAPAROSCOPIC CHOLECYSTECTOMY ;  Surgeon: Claud Kelp, MD;  Location: [facility];  Service: General;  Laterality: N/A;   EYE SURGERY Bilateral 2013   lens for cataracts   LAMINECTOMY  1987   L5-S1   TONSILLECTOMY  1961   Patient Active Problem List   Diagnosis Date Noted   Anxiety 01/26/2023   Hypertriglyceridemia 08/18/2022   Elevated blood sugar 08/18/2022   Closed nondisplaced fracture of left patella 03/09/2018   Obesity (BMI 30.0-34.9) 02/16/2016   Gallstones 06/28/2014   Hyperlipidemia 02/19/2014   Osteoporosis 02/19/2014   Essential hypertension 02/19/2014   GERD (gastroesophageal reflux disease) 02/19/2014   Gout 02/19/2014   PCP: Dr. Ivin Booty Dettinger REFERRING PROVIDER: Malachy Mood, PA-C  ONSET DATE: 07/21/23  REFERRING DIAG: s/p ORIF comminuted articular right  distal humerus fracture  THERAPY DIAG:  Pain in right elbow  Stiffness of right elbow, not elsewhere classified  Other lack of coordination  Other symptoms and signs involving the musculoskeletal system  Rationale for Evaluation and Treatment: Rehabilitation  SUBJECTIVE:   SUBJECTIVE STATEMENT: S: I dressed myself this morning.  Pt accompanied by: family member and step-son (caregiver)  PERTINENT HISTORY: Pt is an 83 y/o female s/p ORIF comminuted articular right distal humerus fracture on 07/21/23. Pt sustained injury when her foot caught on the threshold between 2 rooms and she fell. Pt presents in posterior long arm splint and sling today. Step-son present as caregiver.   PRECAUTIONS: Other: Strictly NWB; ok to begin A/ROM at 2 weeks, wear splint until follow up with MD at 6 weeks; follow [place] Handbook Protocol    WEIGHT BEARING RESTRICTIONS: Yes NWB  PAIN:  Are you having pain? No  FALLS: Has patient fallen in last 6 months? Yes. Number of falls 1  PLOF: Independent  PATIENT GOALS: To be able to use the RUE.   NEXT MD VISIT: 08/29/23  OBJECTIVE:  Note: Objective measures were completed at Evaluation unless otherwise noted.  HAND DOMINANCE: Right  ADLs: Pt is unable to use the dominant RUE for any ADLs, cannot reach overhead or behind back. Pt with min difficulty sleeping.  FUNCTIONAL OUTCOME MEASURES: Upper Extremity Functional Scale (UEFS): 16/80  UPPER EXTREMITY ROM:       Eval: Assessed in sitting, unable to assess P/ROM due to protocol  Active ROM Right eval  Shoulder flexion 69  Shoulder abduction 56  Shoulder internal rotation 90  Shoulder external rotation 19  Elbow flexion 115  Elbow extension 36  Wrist flexion 32  Wrist extension 28  Wrist ulnar deviation 40  Wrist radial deviation 10  Wrist pronation 90  Wrist supination 40  (Blank rows = not tested)   UPPER EXTREMITY MMT:       Eval: Assessed on observation due to precautions  limiting formal MMT  MMT Right eval  Shoulder flexion 3-/5  Shoulder abduction 3-/5  Shoulder internal rotation 3/5  Shoulder external rotation 3-/5  Elbow flexion 3-/5  Elbow extension 3-/5  Wrist flexion 3/5  Wrist extension 3/5  Wrist ulnar deviation 3/5  Wrist radial deviation 3/5  Wrist pronation 3/5  Wrist supination 3-/5  (Blank rows = not tested)  HAND FUNCTION: Grip strength: Right: 5 lbs; Left: 40 lbs  SENSATION: Tingling in fingers along ulnar nerve distribution  EDEMA:       Right:    Left:  Wrist     15.75cm  15.0cm Mid forearm(10cm from wrist)  16.5cm  16.0cm Elbow    27cm   24cm Mid upper arm (10cm from elbow) 30.5cm  27.5cm    OBSERVATIONS: moderate edema and fascial restrictions along entire RUE, min edema in fingers   TREATMENT DATE:  08/18/23 -Retrograde massage to right hand, wrist, forearm, volar elbow, upper arm, and trapezius regions to decrease edema and improve joint ROM -P/ROM: digit flexion, wrist flexion/extension, shoulder flexion, abduction, 10 reps -A/ROM: supine-elbow flexion, extension, 10 reps -Functional reaching: pt placing 10 cones on bottom shelf of overhead cabinet then removing -Countertop wipe: 1'  -A/ROM: seated- elbow flexion/extension, forearm supination/pronation, 10 reps -Towel squeeze: 10 reps for grip strengthening -Towel slides: flexion, abduction, 10 reps  08/16/23 -Retrograde massage to right hand, wrist, forearm, volar elbow, upper arm, and trapezius regions to decrease edema and improve joint ROM -P/ROM: digit flexion, wrist flexion/extension, shoulder flexion, abduction, 10 reps -A/ROM: supine-elbow flexion, extension, 10 reps -A/ROM: elbow flexion/extension, forearm supination/pronation, wrist flexion/extension, ulnar/radial deviation, 10 reps -Provided XS edema glove for hand and wrist -Scapular A/ROM: elevation/depression, extension, 10 reps -Table slides: flexion, 10 reps -Digit composite flexion/extension,  10 reps  08/12/23 -Retrograde massage to right hand, wrist, forearm, volar elbow, upper arm, and trapezius regions to decrease edema and improve joint ROM -P/ROM: digit flexion, wrist flexion/extension, shoulder flexion, abduction, 10 reps -A/ROM: elbow flexion/extension, forearm supination/pronation, wrist flexion/extension, ulnar/radial deviation, 10 reps -Table slides: flexion, 10 reps -Digit composite flexion/extension, 10 reps    PATIENT EDUCATION: Education details: continue HEP, edema glove wear, table slides Person educated: Patient and Caregiver step-son Education method: Explanation, Demonstration, and Handouts Education comprehension: verbalized understanding and returned demonstration  HOME EXERCISE PROGRAM: Eval: elbow and wrist A/ROM 08/18/23: Table slides   GOALS: Goals reviewed with patient? Yes  SHORT TERM GOALS: Target date: 09/09/23  Pt will be provided with and educated on HEP to improve mobility in RUE required for use during ADL completion.   Goal status: IN PROGRESS  2.  Pt will increase RUE A/ROM by 15+ degrees to improve ability to use RUE during dressing tasks with minimal compensatory techniques.   Goal status: IN PROGRESS  3.  Pt will increase RUE strength to 3+/5 to improve  ability to reach for items at waist to chest height during bathing and grooming tasks.   Goal status: IN PROGRESS  4.  Pt will demonstrate decreased edema by at least 25% to improve ability to perform joint mobility tasks and improve functional use of the RUE during grooming tasks.    Goal status: IN PROGRESS    LONG TERM GOALS: Target date: 10/09/23  Pt will decrease pain in RUE to 3/10 or less to improve ability to perform HEP at recommended frequency and duration to improve mobility of RUE required for ADL completion.   Goal status: IN PROGRESS  2.  Pt will decrease RUE fascial restrictions to min amounts or less to improve mobility required for functional reaching tasks.    Goal status: IN PROGRESS  3.  Pt will increase RUE A/ROM by 30+ degrees to improve ability to use RUE when reaching overhead or behind back during dressing and bathing tasks.   Goal status: IN PROGRESS  4.  Pt will increase RUE strength to 4/5 or greater to improve ability to use RUE when lifting or carrying items during meal preparation/housework/yardwork tasks.   Goal status: IN PROGRESS   5.  Pt will increase RUE grip strength by 30# or greater to improve ability to use RUE when grasping and manipulating items for meal preparation or housework.   Goal status: IN PROGRESS    ASSESSMENT:  CLINICAL IMPRESSION: Pt reports her HEP is going well. Marked improvement in edema in hand today with use of edema glove. Pt also noted to have less edema throughout the RUE. Incision area with scab, no new drainage or redness/localized edema. Continued with manual techniques and A/ROM, added functional reaching for the elbow and shoulder today, as well as gentle grip work. Pt tolerating new exercises very well, added table slides to HEP. Verbal cuing for form and technique during exercises.   PERFORMANCE DEFICITS: in functional skills including ADLs, IADLs, coordination, dexterity, proprioception, sensation, edema, ROM, strength, pain, fascial restrictions, muscle spasms, flexibility, UE functional use, and vestibular     PLAN:  OT FREQUENCY: 2x/week  OT DURATION: 8 weeks  PLANNED INTERVENTIONS: 97168 OT Re-evaluation, 97535 self care/ADL training, 16109 therapeutic exercise, 97530 therapeutic activity, 97112 neuromuscular re-education, 97140 manual therapy, 97035 ultrasound, 97018 paraffin, 60454 electrical stimulation unattended, patient/family education, and DME and/or AE instructions  CONSULTED AND AGREED WITH PLAN OF CARE: Patient  PLAN FOR NEXT SESSION: Follow up on HEP, continue manual techniques to address edema and fascial restrictions, follow protocol for A/ROM   Ezra Sites, OTR/L  [phone] 08/18/2023, 11:53 AM

## 2023-08-19 ENCOUNTER — Other Ambulatory Visit: Payer: Self-pay | Admitting: Family Medicine

## 2023-08-19 DIAGNOSIS — M1A9XX0 Chronic gout, unspecified, without tophus (tophi): Secondary | ICD-10-CM

## 2023-08-19 DIAGNOSIS — I10 Essential (primary) hypertension: Secondary | ICD-10-CM

## 2023-08-23 ENCOUNTER — Ambulatory Visit (HOSPITAL_COMMUNITY): Admitting: Occupational Therapy

## 2023-08-23 ENCOUNTER — Encounter (HOSPITAL_COMMUNITY): Payer: Self-pay | Admitting: Occupational Therapy

## 2023-08-23 DIAGNOSIS — M25521 Pain in right elbow: Secondary | ICD-10-CM | POA: Diagnosis not present

## 2023-08-23 DIAGNOSIS — R278 Other lack of coordination: Secondary | ICD-10-CM | POA: Diagnosis not present

## 2023-08-23 DIAGNOSIS — R29898 Other symptoms and signs involving the musculoskeletal system: Secondary | ICD-10-CM | POA: Diagnosis not present

## 2023-08-23 DIAGNOSIS — M25621 Stiffness of right elbow, not elsewhere classified: Secondary | ICD-10-CM

## 2023-08-23 NOTE — Therapy (Signed)
 OUTPATIENT OCCUPATIONAL THERAPY ORTHO TREATMENT  Patient Name: Caitlyn Boone MRN: [id number] DOB:1940-10-07, 83 y.o., female Today's Date: 08/23/2023    END OF SESSION:  OT End of Session - 08/23/23 1204     Visit Number 5    Number of Visits 16    Date for OT Re-Evaluation 10/09/23   progress note 09/09/23   Authorization Type [employer] Medicare, $20 copay    Authorization Time Period 16 visits approved 08/10/23-10/05/23    Authorization - Visit Number 5    Authorization - Number of Visits 16    Progress Note Due on Visit 10    OT Start Time 1102    OT Stop Time 1146    OT Time Calculation (min) 44 min    Activity Tolerance Patient tolerated treatment well    Behavior During Therapy [facility] for tasks assessed/performed                 Past Medical History:  Diagnosis Date   Compression fracture 05/24/2013   L1   GERD (gastroesophageal reflux disease)    Gout    Hyperlipidemia    Hypertension    Knee fracture, left feb 2015   no surgery done   Osteoporosis    Past Surgical History:  Procedure Laterality Date   ABDOMINAL HYSTERECTOMY  1992   complete   CHOLECYSTECTOMY N/A 06/28/2014   Procedure: LAPAROSCOPIC CHOLECYSTECTOMY ;  Surgeon: Claud Kelp, MD;  Location: [facility];  Service: General;  Laterality: N/A;   EYE SURGERY Bilateral 2013   lens for cataracts   LAMINECTOMY  1987   L5-S1   TONSILLECTOMY  1961   Patient Active Problem List   Diagnosis Date Noted   Anxiety 01/26/2023   Hypertriglyceridemia 08/18/2022   Elevated blood sugar 08/18/2022   Closed nondisplaced fracture of left patella 03/09/2018   Obesity (BMI 30.0-34.9) 02/16/2016   Gallstones 06/28/2014   Hyperlipidemia 02/19/2014   Osteoporosis 02/19/2014   Essential hypertension 02/19/2014   GERD (gastroesophageal reflux disease) 02/19/2014   Gout 02/19/2014   PCP: Dr. Ivin Booty Dettinger REFERRING PROVIDER: Malachy Mood, PA-C  ONSET DATE: 07/21/23  REFERRING DIAG: s/p ORIF comminuted articular  right distal humerus fracture  THERAPY DIAG:  Pain in right elbow  Stiffness of right elbow, not elsewhere classified  Other lack of coordination  Rationale for Evaluation and Treatment: Rehabilitation  SUBJECTIVE:   SUBJECTIVE STATEMENT: S: I washed and folded clothes yesterday. Pt accompanied by: family member and step-son (caregiver)  PERTINENT HISTORY: Pt is an 83 y/o female s/p ORIF comminuted articular right distal humerus fracture on 07/21/23. Pt sustained injury when her foot caught on the threshold between 2 rooms and she fell. Pt presents in posterior long arm splint and sling today. Step-son present as caregiver.   PRECAUTIONS: Other: Strictly NWB; ok to begin A/ROM at 2 weeks, wear splint until follow up with MD at 6 weeks; follow [place] Handbook Protocol    WEIGHT BEARING RESTRICTIONS: Yes NWB  PAIN:  Are you having pain? No  FALLS: Has patient fallen in last 6 months? Yes. Number of falls 1  PLOF: Independent  PATIENT GOALS: To be able to use the RUE.   NEXT MD VISIT: 08/29/23  OBJECTIVE:  Note: Objective measures were completed at Evaluation unless otherwise noted.  HAND DOMINANCE: Right  ADLs: Pt is unable to use the dominant RUE for any ADLs, cannot reach overhead or behind back. Pt with min difficulty sleeping.    FUNCTIONAL OUTCOME MEASURES: Upper Extremity Functional Scale (  UEFS): 16/80  UPPER EXTREMITY ROM:       Eval: Assessed in sitting, unable to assess P/ROM due to protocol  Active ROM Right eval  Shoulder flexion 69  Shoulder abduction 56  Shoulder internal rotation 90  Shoulder external rotation 19  Elbow flexion 115  Elbow extension 36  Wrist flexion 32  Wrist extension 28  Wrist ulnar deviation 40  Wrist radial deviation 10  Wrist pronation 90  Wrist supination 40  (Blank rows = not tested)   UPPER EXTREMITY MMT:       Eval: Assessed on observation due to precautions limiting formal MMT  MMT Right eval  Shoulder  flexion 3-/5  Shoulder abduction 3-/5  Shoulder internal rotation 3/5  Shoulder external rotation 3-/5  Elbow flexion 3-/5  Elbow extension 3-/5  Wrist flexion 3/5  Wrist extension 3/5  Wrist ulnar deviation 3/5  Wrist radial deviation 3/5  Wrist pronation 3/5  Wrist supination 3-/5  (Blank rows = not tested)  HAND FUNCTION: Grip strength: Right: 5 lbs; Left: 40 lbs  SENSATION: Tingling in fingers along ulnar nerve distribution  EDEMA:       Right:    Left:  Wrist     15.75cm  15.0cm Mid forearm(10cm from wrist)  16.5cm  16.0cm Elbow    27cm   24cm Mid upper arm (10cm from elbow) 30.5cm  27.5cm    OBSERVATIONS: moderate edema and fascial restrictions along entire RUE, min edema in fingers   TREATMENT DATE:  08/23/23 -Retrograde massage to right forearm, volar elbow, upper arm, and trapezius regions to decrease edema and improve joint ROM -P/ROM: digit flexion, wrist flexion/extension, shoulder flexion, abduction; elbow flexion/extension 10 reps -A/ROM: supine-elbow flexion/extension, shoulder protraction, 10 reps -AA/ROM: supine-shoulder flexion, horizontal abduction, 10 reps -AA/ROM: sitting-elbow extension to elbow flexion, overhead press, and reverse; 10 reps -AA/ROM: sitting-shoulder protraction, flexion, 10 reps -A/ROM: sitting-elbow flexion, extension, forearm supination, pronation, 10 reps -Functional reaching: pt placing 10 cones on bottom shelf of overhead cabinet then removing -Hand gripper: gripper vertical at 20#, large beads  08/18/23 -Retrograde massage to right hand, wrist, forearm, volar elbow, upper arm, and trapezius regions to decrease edema and improve joint ROM -P/ROM: digit flexion, wrist flexion/extension, shoulder flexion, abduction, 10 reps -A/ROM: supine-elbow flexion, extension, 10 reps -Functional reaching: pt placing 10 cones on bottom shelf of overhead cabinet then removing -Countertop wipe: 1'  -A/ROM: seated- elbow flexion/extension,  forearm supination/pronation, 10 reps -Towel squeeze: 10 reps for grip strengthening -Towel slides: flexion, abduction, 10 reps  08/16/23 -Retrograde massage to right hand, wrist, forearm, volar elbow, upper arm, and trapezius regions to decrease edema and improve joint ROM -P/ROM: digit flexion, wrist flexion/extension, shoulder flexion, abduction, 10 reps -A/ROM: supine-elbow flexion, extension, 10 reps -A/ROM: elbow flexion/extension, forearm supination/pronation, wrist flexion/extension, ulnar/radial deviation, 10 reps -Provided XS edema glove for hand and wrist -Scapular A/ROM: elevation/depression, extension, 10 reps -Table slides: flexion, 10 reps -Digit composite flexion/extension, 10 reps     PATIENT EDUCATION: Education details: continue HEP, edema glove wear, table slides Person educated: Patient and Caregiver step-son Education method: Explanation, Demonstration, and Handouts Education comprehension: verbalized understanding and returned demonstration  HOME EXERCISE PROGRAM: Eval: elbow and wrist A/ROM 08/18/23: Table slides   GOALS: Goals reviewed with patient? Yes  SHORT TERM GOALS: Target date: 09/09/23  Pt will be provided with and educated on HEP to improve mobility in RUE required for use during ADL completion.   Goal status: IN PROGRESS  2.  Pt will increase  RUE A/ROM by 15+ degrees to improve ability to use RUE during dressing tasks with minimal compensatory techniques.   Goal status: IN PROGRESS  3.  Pt will increase RUE strength to 3+/5 to improve ability to reach for items at waist to chest height during bathing and grooming tasks.   Goal status: IN PROGRESS  4.  Pt will demonstrate decreased edema by at least 25% to improve ability to perform joint mobility tasks and improve functional use of the RUE during grooming tasks.    Goal status: IN PROGRESS    LONG TERM GOALS: Target date: 10/09/23  Pt will decrease pain in RUE to 3/10 or less to  improve ability to perform HEP at recommended frequency and duration to improve mobility of RUE required for ADL completion.   Goal status: IN PROGRESS  2.  Pt will decrease RUE fascial restrictions to min amounts or less to improve mobility required for functional reaching tasks.   Goal status: IN PROGRESS  3.  Pt will increase RUE A/ROM by 30+ degrees to improve ability to use RUE when reaching overhead or behind back during dressing and bathing tasks.   Goal status: IN PROGRESS  4.  Pt will increase RUE strength to 4/5 or greater to improve ability to use RUE when lifting or carrying items during meal preparation/housework/yardwork tasks.   Goal status: IN PROGRESS   5.  Pt will increase RUE grip strength by 30# or greater to improve ability to use RUE when grasping and manipulating items for meal preparation or housework.   Goal status: IN PROGRESS    ASSESSMENT:  CLINICAL IMPRESSION: Pt reports her HEP is going well has been washing and folding her clothes and putting them away. Continued with edema management and addressing fascial restrictions. Pt continues to have minimal edema in the right hand and digits, educated on removing edema glove unless pt notes increasing edema. Progressed to very gentle P/ROM today, added AA/ROM for shoulder and elbow. Also added grip strengthening, pt with notable improvement in grip compared to evaluation. Verbal cuing for form and technique.   PERFORMANCE DEFICITS: in functional skills including ADLs, IADLs, coordination, dexterity, proprioception, sensation, edema, ROM, strength, pain, fascial restrictions, muscle spasms, flexibility, UE functional use, and vestibular     PLAN:  OT FREQUENCY: 2x/week  OT DURATION: 8 weeks  PLANNED INTERVENTIONS: 97168 OT Re-evaluation, 97535 self care/ADL training, 29528 therapeutic exercise, 97530 therapeutic activity, 97112 neuromuscular re-education, 97140 manual therapy, 97035 ultrasound, 97018  paraffin, 41324 electrical stimulation unattended, patient/family education, and DME and/or AE instructions  CONSULTED AND AGREED WITH PLAN OF CARE: Patient  PLAN FOR NEXT SESSION: Follow up on HEP, continue manual techniques to address edema and fascial restrictions, follow protocol for A/ROM; measure for MD appt and upgrade HEP   Ezra Sites, OTR/L  [phone] 08/23/2023, 12:05 PM

## 2023-08-26 ENCOUNTER — Encounter (HOSPITAL_COMMUNITY): Payer: Self-pay | Admitting: Occupational Therapy

## 2023-08-26 ENCOUNTER — Ambulatory Visit (HOSPITAL_COMMUNITY): Admitting: Occupational Therapy

## 2023-08-26 DIAGNOSIS — M25621 Stiffness of right elbow, not elsewhere classified: Secondary | ICD-10-CM

## 2023-08-26 DIAGNOSIS — M25521 Pain in right elbow: Secondary | ICD-10-CM

## 2023-08-26 DIAGNOSIS — R29898 Other symptoms and signs involving the musculoskeletal system: Secondary | ICD-10-CM | POA: Diagnosis not present

## 2023-08-26 DIAGNOSIS — R278 Other lack of coordination: Secondary | ICD-10-CM

## 2023-08-26 NOTE — Therapy (Signed)
 OUTPATIENT OCCUPATIONAL THERAPY ORTHO TREATMENT  Patient Name: Caitlyn Boone MRN: [id number] DOB:08-12-1940, 83 y.o., female Today's Date: 08/26/2023    END OF SESSION:  OT End of Session - 08/26/23 1150     Visit Number 6    Number of Visits 16    Date for OT Re-Evaluation 10/09/23   progress note 09/09/23   Authorization Type [employer] Medicare, $20 copay    Authorization Time Period 16 visits approved 08/10/23-10/05/23    Authorization - Visit Number 6    Authorization - Number of Visits 16    Progress Note Due on Visit 10    OT Start Time 1057    OT Stop Time 1144    OT Time Calculation (min) 47 min    Activity Tolerance Patient tolerated treatment well    Behavior During Therapy [facility] for tasks assessed/performed                  Past Medical History:  Diagnosis Date   Compression fracture 05/24/2013   L1   GERD (gastroesophageal reflux disease)    Gout    Hyperlipidemia    Hypertension    Knee fracture, left feb 2015   no surgery done   Osteoporosis    Past Surgical History:  Procedure Laterality Date   ABDOMINAL HYSTERECTOMY  1992   complete   CHOLECYSTECTOMY N/A 06/28/2014   Procedure: LAPAROSCOPIC CHOLECYSTECTOMY ;  Surgeon: Claud Kelp, MD;  Location: [facility];  Service: General;  Laterality: N/A;   EYE SURGERY Bilateral 2013   lens for cataracts   LAMINECTOMY  1987   L5-S1   TONSILLECTOMY  1961   Patient Active Problem List   Diagnosis Date Noted   Anxiety 01/26/2023   Hypertriglyceridemia 08/18/2022   Elevated blood sugar 08/18/2022   Closed nondisplaced fracture of left patella 03/09/2018   Obesity (BMI 30.0-34.9) 02/16/2016   Gallstones 06/28/2014   Hyperlipidemia 02/19/2014   Osteoporosis 02/19/2014   Essential hypertension 02/19/2014   GERD (gastroesophageal reflux disease) 02/19/2014   Gout 02/19/2014   PCP: Dr. Ivin Booty Dettinger REFERRING PROVIDER: Malachy Mood, PA-C  ONSET DATE: 07/21/23  REFERRING DIAG: s/p ORIF comminuted articular  right distal humerus fracture  THERAPY DIAG:  Pain in right elbow  Stiffness of right elbow, not elsewhere classified  Other lack of coordination  Other symptoms and signs involving the musculoskeletal system  Rationale for Evaluation and Treatment: Rehabilitation  SUBJECTIVE:   SUBJECTIVE STATEMENT: S: I get at least 4 exercise sessions in a day. Pt accompanied by: family member and step-son (caregiver)  PERTINENT HISTORY: Pt is an 83 y/o female s/p ORIF comminuted articular right distal humerus fracture on 07/21/23. Pt sustained injury when her foot caught on the threshold between 2 rooms and she fell. Pt presents in posterior long arm splint and sling today. Step-son present as caregiver.   PRECAUTIONS: Other: Strictly NWB; ok to begin A/ROM at 2 weeks, wear splint until follow up with MD at 6 weeks; follow [place] Handbook Protocol    WEIGHT BEARING RESTRICTIONS: Yes NWB  PAIN:  Are you having pain? No  FALLS: Has patient fallen in last 6 months? Yes. Number of falls 1  PLOF: Independent  PATIENT GOALS: To be able to use the RUE.   NEXT MD VISIT: 08/29/23  OBJECTIVE:  Note: Objective measures were completed at Evaluation unless otherwise noted.  HAND DOMINANCE: Right  ADLs: Pt is unable to use the dominant RUE for any ADLs, cannot reach overhead or behind back. Pt  with min difficulty sleeping.    FUNCTIONAL OUTCOME MEASURES: Upper Extremity Functional Scale (UEFS): 16/80  UPPER EXTREMITY ROM:       Eval: Assessed in sitting, unable to assess P/ROM due to protocol   08/26/23: Assessed in sitting, er/IR adducted  Active ROM Right eval Right 08/26/23  Shoulder flexion 69 76  Shoulder abduction 56 58  Shoulder internal rotation 90 90  Shoulder external rotation 19 44  Elbow flexion 115 104  Elbow extension 36 45  Wrist flexion 32 40  Wrist extension 28 58  Wrist ulnar deviation 40   Wrist radial deviation 10 22  Wrist pronation 90   Wrist supination 40 80   (Blank rows = not tested)   UPPER EXTREMITY MMT:       Eval: Assessed on observation due to precautions limiting formal MMT   08/26/23: Assessed seated, er/IR adducted  MMT Right eval Right 08/26/23  Shoulder flexion 3-/5 3-/5  Shoulder abduction 3-/5 3-/5  Shoulder internal rotation 3/5 3+/5  Shoulder external rotation 3-/5 3/5  Elbow flexion 3-/5 3+/5  Elbow extension 3-/5 3+/5  Wrist flexion 3/5 4-/5  Wrist extension 3/5 4-/5  Wrist ulnar deviation 3/5 4-/5  Wrist radial deviation 3/5 4+/5  Wrist pronation 3/5 4-/5  Wrist supination 3-/5 4-/5  (Blank rows = not tested)  HAND FUNCTION: Grip strength: Right: 5 lbs; Left: 40 lbs 08/26/23: Right: 18 lbs  SENSATION: Tingling in fingers along ulnar nerve distribution  EDEMA:  Eval     Right:    Left:  Wrist     15.75cm  15.0cm Mid forearm(10cm from wrist)  16.5cm  16.0cm Elbow    27cm   24cm Mid upper arm (10cm from elbow) 30.5cm  27.5cm  08/26/23:      Right:      Wrist     15.25cm   Mid forearm(10cm from wrist)  16.5cm   Elbow    25.75cm    Mid upper arm (10cm from elbow) 28.5cm       OBSERVATIONS: moderate edema and fascial restrictions along RUE forearm and elbow, min edema in fingers and wrist   TREATMENT DATE:  08/26/23 -Retrograde massage to right forearm, volar elbow, upper arm, and trapezius regions to decrease edema and improve joint ROM -P/ROM: shoulder flexion, abduction; elbow flexion/extension 10 reps -A/ROM: supine-elbow flexion/extension, shoulder protraction, 10 reps -AA/ROM: supine-shoulder flexion, horizontal abduction, 10 reps -AA/ROM: sitting-elbow extension to elbow flexion, overhead press, and reverse; 10 reps -AA/ROM: sitting-shoulder protraction, flexion, 10 reps -A/ROM: sitting-elbow flexion, extension, 10 reps -Pulleys: 1' flexion  08/23/23 -Retrograde massage to right forearm, volar elbow, upper arm, and trapezius regions to decrease edema and improve joint ROM -P/ROM: digit  flexion, wrist flexion/extension, shoulder flexion, abduction; elbow flexion/extension 10 reps -A/ROM: supine-elbow flexion/extension, shoulder protraction, 10 reps -AA/ROM: supine-shoulder flexion, horizontal abduction, 10 reps -AA/ROM: sitting-elbow extension to elbow flexion, overhead press, and reverse; 10 reps -AA/ROM: sitting-shoulder protraction, flexion, 10 reps -A/ROM: sitting-elbow flexion, extension, forearm supination, pronation, 10 reps -Functional reaching: pt placing 10 cones on bottom shelf of overhead cabinet then removing -Hand gripper: gripper vertical at 20#, large beads  08/18/23 -Retrograde massage to right hand, wrist, forearm, volar elbow, upper arm, and trapezius regions to decrease edema and improve joint ROM -P/ROM: digit flexion, wrist flexion/extension, shoulder flexion, abduction, 10 reps -A/ROM: supine-elbow flexion, extension, 10 reps -Functional reaching: pt placing 10 cones on bottom shelf of overhead cabinet then removing -Countertop wipe: 1'  -A/ROM: seated- elbow flexion/extension, forearm supination/pronation, 10  reps -Towel squeeze: 10 reps for grip strengthening -Towel slides: flexion, abduction, 10 reps     PATIENT EDUCATION: Education details: continue HEP Person educated: Patient and Caregiver step-son Education method: Explanation, Demonstration, and Handouts Education comprehension: verbalized understanding and returned demonstration  HOME EXERCISE PROGRAM: Eval: elbow and wrist A/ROM 08/18/23: Table slides   GOALS: Goals reviewed with patient? Yes  SHORT TERM GOALS: Target date: 09/09/23  Pt will be provided with and educated on HEP to improve mobility in RUE required for use during ADL completion.   Goal status: IN PROGRESS  2.  Pt will increase RUE A/ROM by 15+ degrees to improve ability to use RUE during dressing tasks with minimal compensatory techniques.   Goal status: IN PROGRESS  3.  Pt will increase RUE strength to 3+/5  to improve ability to reach for items at waist to chest height during bathing and grooming tasks.   Goal status: IN PROGRESS  4.  Pt will demonstrate decreased edema by at least 25% to improve ability to perform joint mobility tasks and improve functional use of the RUE during grooming tasks.    Goal status: IN PROGRESS    LONG TERM GOALS: Target date: 10/09/23  Pt will decrease pain in RUE to 3/10 or less to improve ability to perform HEP at recommended frequency and duration to improve mobility of RUE required for ADL completion.   Goal status: IN PROGRESS  2.  Pt will decrease RUE fascial restrictions to min amounts or less to improve mobility required for functional reaching tasks.   Goal status: IN PROGRESS  3.  Pt will increase RUE A/ROM by 30+ degrees to improve ability to use RUE when reaching overhead or behind back during dressing and bathing tasks.   Goal status: IN PROGRESS  4.  Pt will increase RUE strength to 4/5 or greater to improve ability to use RUE when lifting or carrying items during meal preparation/housework/yardwork tasks.   Goal status: IN PROGRESS   5.  Pt will increase RUE grip strength by 30# or greater to improve ability to use RUE when grasping and manipulating items for meal preparation or housework.   Goal status: IN PROGRESS    ASSESSMENT:  CLINICAL IMPRESSION: Pt reports her HEP is going well and has been completing 3-4x per day. Pt also reporting some occasional burning in her elbow, tingling down to her 5th digit, suspect possible ulnar nerve compression. Pt is reducing edema glove wear, no increased edema noted in her hand or digits today. Continued with manual techniques and gentle passive stretching. AA/ROM and A/ROM completed for elbow and shoulder, added pulleys. Measurements taken for MD appointment on Monday. Pt with improving ROM, does demonstrate stiffness at the elbow, 24/7 splint wear impacting even with HEP completion daily.  Recommend considering a JAS brace when appropriate if stiffness continues.   PERFORMANCE DEFICITS: in functional skills including ADLs, IADLs, coordination, dexterity, proprioception, sensation, edema, ROM, strength, pain, fascial restrictions, muscle spasms, flexibility, UE functional use, and vestibular     PLAN:  OT FREQUENCY: 2x/week  OT DURATION: 8 weeks  PLANNED INTERVENTIONS: 97168 OT Re-evaluation, 97535 self care/ADL training, 16109 therapeutic exercise, 97530 therapeutic activity, 97112 neuromuscular re-education, 97140 manual therapy, 97035 ultrasound, 97018 paraffin, 60454 electrical stimulation unattended, patient/family education, and DME and/or AE instructions  CONSULTED AND AGREED WITH PLAN OF CARE: Patient  PLAN FOR NEXT SESSION: Follow up on HEP, continue manual techniques to address edema and fascial restrictions, follow protocol for A/ROM; follow up on  MD appt, update HEP for AA/ROM for shoulder   Ezra Sites, OTR/L  [phone] 08/26/2023, 11:51 AM

## 2023-08-29 DIAGNOSIS — Z9889 Other specified postprocedural states: Secondary | ICD-10-CM | POA: Diagnosis not present

## 2023-08-29 DIAGNOSIS — S42491A Other displaced fracture of lower end of right humerus, initial encounter for closed fracture: Secondary | ICD-10-CM | POA: Diagnosis not present

## 2023-08-29 DIAGNOSIS — S42251D Displaced fracture of greater tuberosity of right humerus, subsequent encounter for fracture with routine healing: Secondary | ICD-10-CM | POA: Diagnosis not present

## 2023-08-29 DIAGNOSIS — S42411D Displaced simple supracondylar fracture without intercondylar fracture of right humerus, subsequent encounter for fracture with routine healing: Secondary | ICD-10-CM | POA: Diagnosis not present

## 2023-08-29 DIAGNOSIS — S52041D Displaced fracture of coronoid process of right ulna, subsequent encounter for closed fracture with routine healing: Secondary | ICD-10-CM | POA: Diagnosis not present

## 2023-08-30 ENCOUNTER — Encounter (HOSPITAL_COMMUNITY): Payer: Self-pay | Admitting: Occupational Therapy

## 2023-08-30 ENCOUNTER — Ambulatory Visit (HOSPITAL_COMMUNITY): Admitting: Occupational Therapy

## 2023-08-30 DIAGNOSIS — M25521 Pain in right elbow: Secondary | ICD-10-CM

## 2023-08-30 DIAGNOSIS — R278 Other lack of coordination: Secondary | ICD-10-CM

## 2023-08-30 DIAGNOSIS — M25621 Stiffness of right elbow, not elsewhere classified: Secondary | ICD-10-CM | POA: Diagnosis not present

## 2023-08-30 DIAGNOSIS — R29898 Other symptoms and signs involving the musculoskeletal system: Secondary | ICD-10-CM | POA: Diagnosis not present

## 2023-08-30 NOTE — Therapy (Signed)
 OUTPATIENT OCCUPATIONAL THERAPY ORTHO TREATMENT  Patient Name: Caitlyn Boone MRN: [id number] DOB:07-Apr-1941, 83 y.o., female Today's Date: 08/30/2023    END OF SESSION:  OT End of Session - 08/30/23 1234     Visit Number 7    Number of Visits 16    Date for OT Re-Evaluation 10/09/23   progress note 09/09/23   Authorization Type [employer] Medicare, $20 copay    Authorization Time Period 16 visits approved 08/10/23-10/05/23    Authorization - Visit Number 7    Authorization - Number of Visits 16    Progress Note Due on Visit 10    OT Start Time 1149    OT Stop Time 1233    OT Time Calculation (min) 44 min    Activity Tolerance Patient tolerated treatment well    Behavior During Therapy [facility] for tasks assessed/performed                   Past Medical History:  Diagnosis Date   Compression fracture 05/24/2013   L1   GERD (gastroesophageal reflux disease)    Gout    Hyperlipidemia    Hypertension    Knee fracture, left feb 2015   no surgery done   Osteoporosis    Past Surgical History:  Procedure Laterality Date   ABDOMINAL HYSTERECTOMY  1992   complete   CHOLECYSTECTOMY N/A 06/28/2014   Procedure: LAPAROSCOPIC CHOLECYSTECTOMY ;  Surgeon: Claud Kelp, MD;  Location: [facility];  Service: General;  Laterality: N/A;   EYE SURGERY Bilateral 2013   lens for cataracts   LAMINECTOMY  1987   L5-S1   TONSILLECTOMY  1961   Patient Active Problem List   Diagnosis Date Noted   Anxiety 01/26/2023   Hypertriglyceridemia 08/18/2022   Elevated blood sugar 08/18/2022   Closed nondisplaced fracture of left patella 03/09/2018   Obesity (BMI 30.0-34.9) 02/16/2016   Gallstones 06/28/2014   Hyperlipidemia 02/19/2014   Osteoporosis 02/19/2014   Essential hypertension 02/19/2014   GERD (gastroesophageal reflux disease) 02/19/2014   Gout 02/19/2014   PCP: Dr. Ivin Booty Dettinger REFERRING PROVIDER: Malachy Mood, PA-C  ONSET DATE: 07/21/23  REFERRING DIAG: s/p ORIF comminuted articular  right distal humerus fracture  THERAPY DIAG:  Pain in right elbow  Stiffness of right elbow, not elsewhere classified  Other lack of coordination  Other symptoms and signs involving the musculoskeletal system  Rationale for Evaluation and Treatment: Rehabilitation  SUBJECTIVE:   SUBJECTIVE STATEMENT: S: I had a good report.  Pt accompanied by: family member and step-son (caregiver)  PERTINENT HISTORY: Pt is an 83 y/o female s/p ORIF comminuted articular right distal humerus fracture on 07/21/23. Pt sustained injury when her foot caught on the threshold between 2 rooms and she fell. Pt presents in posterior long arm splint and sling today. Step-son present as caregiver.   PRECAUTIONS: Other: Strictly NWB; ok to begin A/ROM at 2 weeks, wear splint until follow up with MD at 6 weeks; follow [place] Handbook Protocol    WEIGHT BEARING RESTRICTIONS: Yes NWB  PAIN:  Are you having pain? No  FALLS: Has patient fallen in last 6 months? Yes. Number of falls 1  PLOF: Independent  PATIENT GOALS: To be able to use the RUE.   NEXT MD VISIT: 08/29/23  OBJECTIVE:  Note: Objective measures were completed at Evaluation unless otherwise noted.  HAND DOMINANCE: Right  ADLs: Pt is unable to use the dominant RUE for any ADLs, cannot reach overhead or behind back. Pt with min difficulty  sleeping.    FUNCTIONAL OUTCOME MEASURES: Upper Extremity Functional Scale (UEFS): 16/80  UPPER EXTREMITY ROM:       Eval: Assessed in sitting, unable to assess P/ROM due to protocol   08/26/23: Assessed in sitting, er/IR adducted  Active ROM Right eval Right 08/26/23  Shoulder flexion 69 76  Shoulder abduction 56 58  Shoulder internal rotation 90 90  Shoulder external rotation 19 44  Elbow flexion 115 104  Elbow extension 36 45  Wrist flexion 32 40  Wrist extension 28 58  Wrist ulnar deviation 40   Wrist radial deviation 10 22  Wrist pronation 90   Wrist supination 40 80  (Blank rows = not  tested)   UPPER EXTREMITY MMT:       Eval: Assessed on observation due to precautions limiting formal MMT   08/26/23: Assessed seated, er/IR adducted  MMT Right eval Right 08/26/23  Shoulder flexion 3-/5 3-/5  Shoulder abduction 3-/5 3-/5  Shoulder internal rotation 3/5 3+/5  Shoulder external rotation 3-/5 3/5  Elbow flexion 3-/5 3+/5  Elbow extension 3-/5 3+/5  Wrist flexion 3/5 4-/5  Wrist extension 3/5 4-/5  Wrist ulnar deviation 3/5 4-/5  Wrist radial deviation 3/5 4+/5  Wrist pronation 3/5 4-/5  Wrist supination 3-/5 4-/5  (Blank rows = not tested)  HAND FUNCTION: Grip strength: Right: 5 lbs; Left: 40 lbs 08/26/23: Right: 18 lbs  SENSATION: Tingling in fingers along ulnar nerve distribution  EDEMA:  Eval     Right:    Left:  Wrist     15.75cm  15.0cm Mid forearm(10cm from wrist)  16.5cm  16.0cm Elbow    27cm   24cm Mid upper arm (10cm from elbow) 30.5cm  27.5cm  08/26/23:      Right:      Wrist     15.25cm   Mid forearm(10cm from wrist)  16.5cm   Elbow    25.75cm    Mid upper arm (10cm from elbow) 28.5cm       OBSERVATIONS: moderate edema and fascial restrictions along RUE forearm and elbow, min edema in fingers and wrist   TREATMENT DATE:  08/30/23 -Retrograde massage to right forearm, volar elbow, upper arm, and trapezius regions to decrease edema and improve joint ROM -P/ROM: shoulder flexion, abduction; elbow flexion/extension 10 reps -AA/ROM: supine-protraction, shoulder flexion, horizontal abduction, 10 reps -AA/ROM: sitting-elbow extension to elbow flexion, overhead press, and reverse; 10 reps -AA/ROM: sitting-shoulder protraction, flexion, 10 reps -A/ROM: sitting-elbow flexion, extension, 10 reps -Functional reaching: pt placing 10 cones on bottom shelf of overhead cabinet then removing in abduction  08/26/23 -Retrograde massage to right forearm, volar elbow, upper arm, and trapezius regions to decrease edema and improve joint ROM -P/ROM:  shoulder flexion, abduction; elbow flexion/extension 10 reps -A/ROM: supine-elbow flexion/extension, shoulder protraction, 10 reps -AA/ROM: supine-shoulder flexion, horizontal abduction, 10 reps -AA/ROM: sitting-elbow extension to elbow flexion, overhead press, and reverse; 10 reps -AA/ROM: sitting-shoulder protraction, flexion, 10 reps -A/ROM: sitting-elbow flexion, extension, 10 reps -Pulleys: 1' flexion  08/23/23 -Retrograde massage to right forearm, volar elbow, upper arm, and trapezius regions to decrease edema and improve joint ROM -P/ROM: digit flexion, wrist flexion/extension, shoulder flexion, abduction; elbow flexion/extension 10 reps -A/ROM: supine-elbow flexion/extension, shoulder protraction, 10 reps -AA/ROM: supine-shoulder flexion, horizontal abduction, 10 reps -AA/ROM: sitting-elbow extension to elbow flexion, overhead press, and reverse; 10 reps -AA/ROM: sitting-shoulder protraction, flexion, 10 reps -A/ROM: sitting-elbow flexion, extension, forearm supination, pronation, 10 reps -Functional reaching: pt placing 10 cones on bottom shelf of overhead cabinet  then removing -Hand gripper: gripper vertical at 20#, large beads     PATIENT EDUCATION: Education details: continue HEP Person educated: Patient and Caregiver step-son Education method: Explanation, Demonstration, and Handouts Education comprehension: verbalized understanding and returned demonstration  HOME EXERCISE PROGRAM: Eval: elbow and wrist A/ROM 08/18/23: Table slides   GOALS: Goals reviewed with patient? Yes  SHORT TERM GOALS: Target date: 09/09/23  Pt will be provided with and educated on HEP to improve mobility in RUE required for use during ADL completion.   Goal status: IN PROGRESS  2.  Pt will increase RUE A/ROM by 15+ degrees to improve ability to use RUE during dressing tasks with minimal compensatory techniques.   Goal status: IN PROGRESS  3.  Pt will increase RUE strength to 3+/5 to  improve ability to reach for items at waist to chest height during bathing and grooming tasks.   Goal status: IN PROGRESS  4.  Pt will demonstrate decreased edema by at least 25% to improve ability to perform joint mobility tasks and improve functional use of the RUE during grooming tasks.    Goal status: IN PROGRESS    LONG TERM GOALS: Target date: 10/09/23  Pt will decrease pain in RUE to 3/10 or less to improve ability to perform HEP at recommended frequency and duration to improve mobility of RUE required for ADL completion.   Goal status: IN PROGRESS  2.  Pt will decrease RUE fascial restrictions to min amounts or less to improve mobility required for functional reaching tasks.   Goal status: IN PROGRESS  3.  Pt will increase RUE A/ROM by 30+ degrees to improve ability to use RUE when reaching overhead or behind back during dressing and bathing tasks.   Goal status: IN PROGRESS  4.  Pt will increase RUE strength to 4/5 or greater to improve ability to use RUE when lifting or carrying items during meal preparation/housework/yardwork tasks.   Goal status: IN PROGRESS   5.  Pt will increase RUE grip strength by 30# or greater to improve ability to use RUE when grasping and manipulating items for meal preparation or housework.   Goal status: IN PROGRESS    ASSESSMENT:  CLINICAL IMPRESSION: Pt reports her MD appt on Monday went well. Per MD notes pt is cleared to remove splint at home, wearing when out in public. Pt can also complete P/ROM focusing on elbow flexion. Continued with manual techniques and passive stretching, pt with full shoulder ROM during P/ROM. Continued with AA/ROM in supine targeting both shoulder and elbow, A/ROM for elbow independently. Resumed functional reaching today, attempted overhead lacing however pt unable to complete due to quickly fatiguing. Verbal cuing for form and technique during session. Pt with small improvements in elbow flexion and good  tolerance to putting gentle pressure into elbow flexion.  PERFORMANCE DEFICITS: in functional skills including ADLs, IADLs, coordination, dexterity, proprioception, sensation, edema, ROM, strength, pain, fascial restrictions, muscle spasms, flexibility, UE functional use, and vestibular     PLAN:  OT FREQUENCY: 2x/week  OT DURATION: 8 weeks  PLANNED INTERVENTIONS: 97168 OT Re-evaluation, 97535 self care/ADL training, 21308 therapeutic exercise, 97530 therapeutic activity, 97112 neuromuscular re-education, 97140 manual therapy, 97035 ultrasound, 97018 paraffin, 65784 electrical stimulation unattended, patient/family education, and DME and/or AE instructions  CONSULTED AND AGREED WITH PLAN OF CARE: Patient  PLAN FOR NEXT SESSION: Follow up on HEP, continue manual techniques to address edema and fascial restrictions, follow protocol for A/ROM; update HEP   Ezra Sites, OTR/L  [phone]  08/30/2023, 12:34 PM

## 2023-09-01 ENCOUNTER — Encounter (HOSPITAL_COMMUNITY): Payer: Self-pay | Admitting: Occupational Therapy

## 2023-09-01 ENCOUNTER — Ambulatory Visit (HOSPITAL_COMMUNITY): Admitting: Occupational Therapy

## 2023-09-01 DIAGNOSIS — M25621 Stiffness of right elbow, not elsewhere classified: Secondary | ICD-10-CM

## 2023-09-01 DIAGNOSIS — R29898 Other symptoms and signs involving the musculoskeletal system: Secondary | ICD-10-CM | POA: Diagnosis not present

## 2023-09-01 DIAGNOSIS — M25521 Pain in right elbow: Secondary | ICD-10-CM | POA: Diagnosis not present

## 2023-09-01 DIAGNOSIS — R278 Other lack of coordination: Secondary | ICD-10-CM

## 2023-09-01 NOTE — Patient Instructions (Signed)
 Perform each exercise ____10-15____ reps. 2-3x days.   1) Protraction   Start by holding a wand or cane at chest height.  Next, slowly push the wand outwards in front of your body so that your elbows become fully straightened. Then, return to the original position.     2) Shoulder FLEXION   In the standing position, hold a wand/cane with both arms, palms down on both sides. Raise up the wand/cane allowing your unaffected arm to perform most of the effort. Your affected arm should be partially relaxed.      3) Internal/External ROTATION   In the standing position, hold a wand/cane with both hands keeping your elbows bent. Move your arms and wand/cane to one side.  Your affected arm should be partially relaxed while your unaffected arm performs most of the effort.       4) Shoulder ABDUCTION   While holding a wand/cane palm face up on the injured side and palm face down on the uninjured side, slowly raise up your injured arm to the side.        5) Horizontal Abduction/Adduction      Straight arms holding cane at shoulder height, bring cane to right, center, left. Repeat starting to left.   Copyright  [employer]. All rights reserved.     6) Bicep Curl into Overhead Press    Standing hold wand towards ground with elbows straight, bend elbows while doing a bicep curl (hold position for 3-5 seconds), push wand up overhead to straighten elbow (hold for 3-5 seconds). Return to start position.

## 2023-09-01 NOTE — Therapy (Signed)
 OUTPATIENT OCCUPATIONAL THERAPY ORTHO TREATMENT  Patient Name: Caitlyn Boone MRN: [id number] DOB:[birth year], 83 y.o., female Today's Date: 09/01/2023    END OF SESSION:  OT End of Session - 09/01/23 1146     Visit Number 8    Number of Visits 16    Date for OT Re-Evaluation 10/09/23   progress note 09/09/23   Authorization Type [employer] Medicare, $20 copay    Authorization Time Period 16 visits approved 08/10/23-10/05/23    Authorization - Visit Number 8    Authorization - Number of Visits 16    Progress Note Due on Visit 10    OT Start Time 1101    OT Stop Time 1145    OT Time Calculation (min) 44 min    Activity Tolerance Patient tolerated treatment well    Behavior During Therapy WFL for tasks assessed/performed                    Past Medical History:  Diagnosis Date   Compression fracture 05/24/2013   L1   GERD (gastroesophageal reflux disease)    Gout    Hyperlipidemia    Hypertension    Knee fracture, left feb 2015   no surgery done   Osteoporosis    Past Surgical History:  Procedure Laterality Date   ABDOMINAL HYSTERECTOMY  1992   complete   CHOLECYSTECTOMY N/A 06/28/2014   Procedure: LAPAROSCOPIC CHOLECYSTECTOMY ;  Surgeon: Claud Kelp, MD;  Location: [facility];  Service: General;  Laterality: N/A;   EYE SURGERY Bilateral 2013   lens for cataracts   LAMINECTOMY  1987   L5-S1   TONSILLECTOMY  1961   Patient Active Problem List   Diagnosis Date Noted   Anxiety 01/26/2023   Hypertriglyceridemia 08/18/2022   Elevated blood sugar 08/18/2022   Closed nondisplaced fracture of left patella 03/09/2018   Obesity (BMI 30.0-34.9) 02/16/2016   Gallstones 06/28/2014   Hyperlipidemia 02/19/2014   Osteoporosis 02/19/2014   Essential hypertension 02/19/2014   GERD (gastroesophageal reflux disease) 02/19/2014   Gout 02/19/2014   PCP: Dr. Ivin Booty Dettinger REFERRING PROVIDER: Malachy Mood, PA-C  ONSET DATE: 07/21/23  REFERRING DIAG: s/p ORIF comminuted  articular right distal humerus fracture  THERAPY DIAG:  Pain in right elbow  Stiffness of right elbow, not elsewhere classified  Other lack of coordination  Other symptoms and signs involving the musculoskeletal system  Rationale for Evaluation and Treatment: Rehabilitation  SUBJECTIVE:   SUBJECTIVE STATEMENT: S:  Pt accompanied by: family member and step-son (caregiver)  PERTINENT HISTORY: Pt is an 83 y/o female s/p ORIF comminuted articular right distal humerus fracture on 07/21/23. Pt sustained injury when her foot caught on the threshold between 2 rooms and she fell. Pt presents in posterior long arm splint and sling today. Step-son present as caregiver.   PRECAUTIONS: Other: Strictly NWB; ok to begin A/ROM at 2 weeks, wear splint until follow up with MD at 6 weeks; follow [place] Handbook Protocol    WEIGHT BEARING RESTRICTIONS: Yes NWB  PAIN:  Are you having pain? No  FALLS: Has patient fallen in last 6 months? Yes. Number of falls 1  PLOF: Independent  PATIENT GOALS: To be able to use the RUE.   NEXT MD VISIT: 08/29/23  OBJECTIVE:  Note: Objective measures were completed at Evaluation unless otherwise noted.  HAND DOMINANCE: Right  ADLs: Pt is unable to use the dominant RUE for any ADLs, cannot reach overhead or behind back. Pt with min difficulty sleeping.  FUNCTIONAL OUTCOME MEASURES: Upper Extremity Functional Scale (UEFS): 16/80  UPPER EXTREMITY ROM:       Eval: Assessed in sitting, unable to assess P/ROM due to protocol   08/26/23: Assessed in sitting, er/IR adducted  Active ROM Right eval Right 08/26/23  Shoulder flexion 69 76  Shoulder abduction 56 58  Shoulder internal rotation 90 90  Shoulder external rotation 19 44  Elbow flexion 115 104  Elbow extension 36 45  Wrist flexion 32 40  Wrist extension 28 58  Wrist ulnar deviation 40   Wrist radial deviation 10 22  Wrist pronation 90   Wrist supination 40 80  (Blank rows = not  tested)   UPPER EXTREMITY MMT:       Eval: Assessed on observation due to precautions limiting formal MMT   08/26/23: Assessed seated, er/IR adducted  MMT Right eval Right 08/26/23  Shoulder flexion 3-/5 3-/5  Shoulder abduction 3-/5 3-/5  Shoulder internal rotation 3/5 3+/5  Shoulder external rotation 3-/5 3/5  Elbow flexion 3-/5 3+/5  Elbow extension 3-/5 3+/5  Wrist flexion 3/5 4-/5  Wrist extension 3/5 4-/5  Wrist ulnar deviation 3/5 4-/5  Wrist radial deviation 3/5 4+/5  Wrist pronation 3/5 4-/5  Wrist supination 3-/5 4-/5  (Blank rows = not tested)  HAND FUNCTION: Grip strength: Right: 5 lbs; Left: 40 lbs 08/26/23: Right: 18 lbs  SENSATION: Tingling in fingers along ulnar nerve distribution  EDEMA:  Eval     Right:    Left:  Wrist     15.75cm  15.0cm Mid forearm(10cm from wrist)  16.5cm  16.0cm Elbow    27cm   24cm Mid upper arm (10cm from elbow) 30.5cm  27.5cm  08/26/23:      Right:      Wrist     15.25cm   Mid forearm(10cm from wrist)  16.5cm   Elbow    25.75cm    Mid upper arm (10cm from elbow) 28.5cm       OBSERVATIONS: moderate edema and fascial restrictions along RUE forearm and elbow, min edema in fingers and wrist   TREATMENT DATE:  08/31/23 -Retrograde massage and myofascial release to right forearm, volar elbow, upper arm, and trapezius regions to decrease edema and improve joint ROM -P/ROM: shoulder flexion, abduction; elbow flexion/extension 10 reps -AA/ROM: supine-protraction, shoulder flexion, horizontal abduction, er, 10 reps -AA/ROM: sitting-elbow flexion and extension, 10 reps  -AA/ROM: sitting-shoulder protraction, flexion, er, abduction, 10 reps -A/ROM: sitting-elbow flexion, extension, 10 reps -Wall wash: 1' flexion  08/30/23 -Retrograde massage to right forearm, volar elbow, upper arm, and trapezius regions to decrease edema and improve joint ROM -P/ROM: shoulder flexion, abduction; elbow flexion/extension 10 reps -AA/ROM:  supine-protraction, shoulder flexion, horizontal abduction, 10 reps -AA/ROM: sitting-elbow extension to elbow flexion, overhead press, and reverse; 10 reps -AA/ROM: sitting-shoulder protraction, flexion, 10 reps -A/ROM: sitting-elbow flexion, extension, 10 reps -Functional reaching: pt placing 10 cones on bottom shelf of overhead cabinet then removing in abduction  08/26/23 -Retrograde massage to right forearm, volar elbow, upper arm, and trapezius regions to decrease edema and improve joint ROM -P/ROM: shoulder flexion, abduction; elbow flexion/extension 10 reps -A/ROM: supine-elbow flexion/extension, shoulder protraction, 10 reps -AA/ROM: supine-shoulder flexion, horizontal abduction, 10 reps -AA/ROM: sitting-elbow extension to elbow flexion, overhead press, and reverse; 10 reps -AA/ROM: sitting-shoulder protraction, flexion, 10 reps -A/ROM: sitting-elbow flexion, extension, 10 reps -Pulleys: 1' flexion     PATIENT EDUCATION: Education details: AA/ROM for shoulder (except horizontal abduction) and elbow Person educated: Patient and Caregiver step-son Education  method: Explanation, Demonstration, and Handouts Education comprehension: verbalized understanding and returned demonstration  HOME EXERCISE PROGRAM: Eval: elbow and wrist A/ROM 08/18/23: Table slides 09/01/23: AA/ROM shoulder and elbow   GOALS: Goals reviewed with patient? Yes  SHORT TERM GOALS: Target date: 09/09/23  Pt will be provided with and educated on HEP to improve mobility in RUE required for use during ADL completion.   Goal status: IN PROGRESS  2.  Pt will increase RUE A/ROM by 15+ degrees to improve ability to use RUE during dressing tasks with minimal compensatory techniques.   Goal status: IN PROGRESS  3.  Pt will increase RUE strength to 3+/5 to improve ability to reach for items at waist to chest height during bathing and grooming tasks.   Goal status: IN PROGRESS  4.  Pt will demonstrate decreased  edema by at least 25% to improve ability to perform joint mobility tasks and improve functional use of the RUE during grooming tasks.    Goal status: IN PROGRESS    LONG TERM GOALS: Target date: 10/09/23  Pt will decrease pain in RUE to 3/10 or less to improve ability to perform HEP at recommended frequency and duration to improve mobility of RUE required for ADL completion.   Goal status: IN PROGRESS  2.  Pt will decrease RUE fascial restrictions to min amounts or less to improve mobility required for functional reaching tasks.   Goal status: IN PROGRESS  3.  Pt will increase RUE A/ROM by 30+ degrees to improve ability to use RUE when reaching overhead or behind back during dressing and bathing tasks.   Goal status: IN PROGRESS  4.  Pt will increase RUE strength to 4/5 or greater to improve ability to use RUE when lifting or carrying items during meal preparation/housework/yardwork tasks.   Goal status: IN PROGRESS   5.  Pt will increase RUE grip strength by 30# or greater to improve ability to use RUE when grasping and manipulating items for meal preparation or housework.   Goal status: IN PROGRESS    ASSESSMENT:  CLINICAL IMPRESSION: Pt reports a burning in her elbow sometimes when wearing the splint. Pt is only wearing when out and about, not at home. Discussed  pt's wearing of the ace bandage and spandex sleeve. Instructed pt not to wear anything when she is home, as the bandage is impeding flexion during functional tasks. Ok to wear sleeve if she wants to. Only wear splint when going out of the house. Added AA/ROM er and abduction for shoulder today, AA/ROM for elbow. Continued with gentle passive stretching. Pt without increased pain, mod to max visual and verbal cuing for correct for and technique. Donned sleeve and splint only, did not use ace wrap, pt with good splint fit, no pain at elbow.   PERFORMANCE DEFICITS: in functional skills including ADLs, IADLs, coordination,  dexterity, proprioception, sensation, edema, ROM, strength, pain, fascial restrictions, muscle spasms, flexibility, UE functional use, and vestibular     PLAN:  OT FREQUENCY: 2x/week  OT DURATION: 8 weeks  PLANNED INTERVENTIONS: 97168 OT Re-evaluation, 97535 self care/ADL training, 13086 therapeutic exercise, 97530 therapeutic activity, 97112 neuromuscular re-education, 97140 manual therapy, 97035 ultrasound, 97018 paraffin, 57846 electrical stimulation unattended, patient/family education, and DME and/or AE instructions  CONSULTED AND AGREED WITH PLAN OF CARE: Patient  PLAN FOR NEXT SESSION: Follow up on HEP, progress note, continue with manual techniques, P/ROM, AA/ROM, and A/ROM   Ezra Sites, OTR/L  [phone] 09/01/2023, 11:46 AM

## 2023-09-05 ENCOUNTER — Encounter (HOSPITAL_COMMUNITY): Payer: Self-pay | Admitting: Occupational Therapy

## 2023-09-05 ENCOUNTER — Ambulatory Visit (HOSPITAL_COMMUNITY): Admitting: Occupational Therapy

## 2023-09-05 DIAGNOSIS — M25521 Pain in right elbow: Secondary | ICD-10-CM

## 2023-09-05 DIAGNOSIS — R29898 Other symptoms and signs involving the musculoskeletal system: Secondary | ICD-10-CM

## 2023-09-05 DIAGNOSIS — M25621 Stiffness of right elbow, not elsewhere classified: Secondary | ICD-10-CM | POA: Diagnosis not present

## 2023-09-05 DIAGNOSIS — R278 Other lack of coordination: Secondary | ICD-10-CM | POA: Diagnosis not present

## 2023-09-05 NOTE — Therapy (Signed)
 OUTPATIENT OCCUPATIONAL THERAPY ORTHO TREATMENT  Patient Name: Caitlyn Boone MRN: [id number] DOB:1941-04-20, 83 y.o., female Today's Date: 09/05/2023    END OF SESSION:  OT End of Session - 09/05/23 1428     Visit Number 9    Number of Visits 16    Date for OT Re-Evaluation 10/09/23   progress note 09/09/23   Authorization Type [employer] Medicare, $20 copay    Authorization Time Period 16 visits approved 08/10/23-10/05/23    Authorization - Visit Number 9    Authorization - Number of Visits 16    Progress Note Due on Visit 10    OT Start Time 1344    OT Stop Time 1427    OT Time Calculation (min) 43 min    Activity Tolerance Patient tolerated treatment well    Behavior During Therapy WFL for tasks assessed/performed                     Past Medical History:  Diagnosis Date   Compression fracture 05/24/2013   L1   GERD (gastroesophageal reflux disease)    Gout    Hyperlipidemia    Hypertension    Knee fracture, left feb 2015   no surgery done   Osteoporosis    Past Surgical History:  Procedure Laterality Date   ABDOMINAL HYSTERECTOMY  1992   complete   CHOLECYSTECTOMY N/A 06/28/2014   Procedure: LAPAROSCOPIC CHOLECYSTECTOMY ;  Surgeon: Claud Kelp, MD;  Location: [facility];  Service: General;  Laterality: N/A;   EYE SURGERY Bilateral 2013   lens for cataracts   LAMINECTOMY  1987   L5-S1   TONSILLECTOMY  1961   Patient Active Problem List   Diagnosis Date Noted   Anxiety 01/26/2023   Hypertriglyceridemia 08/18/2022   Elevated blood sugar 08/18/2022   Closed nondisplaced fracture of left patella 03/09/2018   Obesity (BMI 30.0-34.9) 02/16/2016   Gallstones 06/28/2014   Hyperlipidemia 02/19/2014   Osteoporosis 02/19/2014   Essential hypertension 02/19/2014   GERD (gastroesophageal reflux disease) 02/19/2014   Gout 02/19/2014   PCP: Dr. Ivin Booty Dettinger REFERRING PROVIDER: Malachy Mood, PA-C  ONSET DATE: 07/21/23  REFERRING DIAG: s/p ORIF comminuted  articular right distal humerus fracture  THERAPY DIAG:  Pain in right elbow  Stiffness of right elbow, not elsewhere classified  Other lack of coordination  Other symptoms and signs involving the musculoskeletal system  Rationale for Evaluation and Treatment: Rehabilitation  SUBJECTIVE:   SUBJECTIVE STATEMENT: S: I took the bandage off and put the sleeve on at night. Pt accompanied by: family member and step-son (caregiver)  PERTINENT HISTORY: Pt is an 83 y/o female s/p ORIF comminuted articular right distal humerus fracture on 07/21/23. Pt sustained injury when her foot caught on the threshold between 2 rooms and she fell. Pt presents in posterior long arm splint and sling today. Step-son present as caregiver.   PRECAUTIONS: Other: Strictly NWB; ok to begin A/ROM at 2 weeks, wear splint until follow up with MD at 6 weeks; follow [place] Handbook Protocol    WEIGHT BEARING RESTRICTIONS: Yes NWB  PAIN:  Are you having pain? No  FALLS: Has patient fallen in last 6 months? Yes. Number of falls 1  PLOF: Independent  PATIENT GOALS: To be able to use the RUE.   NEXT MD VISIT: unsure  OBJECTIVE:  Note: Objective measures were completed at Evaluation unless otherwise noted.  HAND DOMINANCE: Right  ADLs: Pt is unable to use the dominant RUE for any ADLs, cannot reach  overhead or behind back. Pt with min difficulty sleeping.    FUNCTIONAL OUTCOME MEASURES: Upper Extremity Functional Scale (UEFS): 16/80  UPPER EXTREMITY ROM:       Eval: Assessed in sitting, unable to assess P/ROM due to protocol   08/26/23: Assessed in sitting, er/IR adducted  Active ROM Right eval Right 08/26/23  Shoulder flexion 69 76  Shoulder abduction 56 58  Shoulder internal rotation 90 90  Shoulder external rotation 19 44  Elbow flexion 115 104  Elbow extension 36 45  Wrist flexion 32 40  Wrist extension 28 58  Wrist ulnar deviation 40   Wrist radial deviation 10 22  Wrist pronation 90    Wrist supination 40 80  (Blank rows = not tested)   UPPER EXTREMITY MMT:       Eval: Assessed on observation due to precautions limiting formal MMT   08/26/23: Assessed seated, er/IR adducted  MMT Right eval Right 08/26/23  Shoulder flexion 3-/5 3-/5  Shoulder abduction 3-/5 3-/5  Shoulder internal rotation 3/5 3+/5  Shoulder external rotation 3-/5 3/5  Elbow flexion 3-/5 3+/5  Elbow extension 3-/5 3+/5  Wrist flexion 3/5 4-/5  Wrist extension 3/5 4-/5  Wrist ulnar deviation 3/5 4-/5  Wrist radial deviation 3/5 4+/5  Wrist pronation 3/5 4-/5  Wrist supination 3-/5 4-/5  (Blank rows = not tested)  HAND FUNCTION: Grip strength: Right: 5 lbs; Left: 40 lbs 08/26/23: Right: 18 lbs  SENSATION: Tingling in fingers along ulnar nerve distribution  EDEMA:  Eval     Right:    Left:  Wrist     15.75cm  15.0cm Mid forearm(10cm from wrist)  16.5cm  16.0cm Elbow    27cm   24cm Mid upper arm (10cm from elbow) 30.5cm  27.5cm  08/26/23:      Right:      Wrist     15.25cm   Mid forearm(10cm from wrist)  16.5cm   Elbow    25.75cm    Mid upper arm (10cm from elbow) 28.5cm       OBSERVATIONS: moderate edema and fascial restrictions along RUE forearm and elbow, min edema in fingers and wrist   TREATMENT DATE:  09/05/23 -Retrograde massage and myofascial release to right forearm, volar elbow, upper arm, and trapezius regions to decrease edema and improve joint ROM -P/ROM: shoulder flexion, abduction; elbow flexion/extension 5 reps -A/ROM: supine-shoulder protraction, flexion, horizontal abduction, er, 10 reps -AA/ROM: sitting-elbow flexion and extension, 12 reps  -A/ROM: sitting-shoulder protraction, flexion, er, abduction; elbow flexion/extension, 10 reps -Wall wash: 1' flexion -Pulleys: 1' flexion -Functional reaching: pinch tree-pt placing red and yellow clothespin along vertical bar of pinch tree, task focusing on shoulder flexion and protraction, as well as elbow extension;  completed all but 1 clothespin -UBE: level 1, 2' forward  08/31/23 -Retrograde massage and myofascial release to right forearm, volar elbow, upper arm, and trapezius regions to decrease edema and improve joint ROM -P/ROM: shoulder flexion, abduction; elbow flexion/extension 10 reps -AA/ROM: supine-protraction, shoulder flexion, horizontal abduction, er, 10 reps -AA/ROM: sitting-elbow flexion and extension, 10 reps  -AA/ROM: sitting-shoulder protraction, flexion, er, abduction, 10 reps -A/ROM: sitting-elbow flexion, extension, 10 reps -Wall wash: 1' flexion  08/30/23 -Retrograde massage to right forearm, volar elbow, upper arm, and trapezius regions to decrease edema and improve joint ROM -P/ROM: shoulder flexion, abduction; elbow flexion/extension 10 reps -AA/ROM: supine-protraction, shoulder flexion, horizontal abduction, 10 reps -AA/ROM: sitting-elbow extension to elbow flexion, overhead press, and reverse; 10 reps -AA/ROM: sitting-shoulder protraction, flexion, 10 reps -  A/ROM: sitting-elbow flexion, extension, 10 reps -Functional reaching: pt placing 10 cones on bottom shelf of overhead cabinet then removing in abduction     PATIENT EDUCATION: Education details: AA/ROM for shoulder (except horizontal abduction) and elbow Person educated: Patient and Caregiver step-son Education method: Explanation, Demonstration, and Handouts Education comprehension: verbalized understanding and returned demonstration  HOME EXERCISE PROGRAM: Eval: elbow and wrist A/ROM 08/18/23: Table slides 09/01/23: AA/ROM shoulder and elbow   GOALS: Goals reviewed with patient? Yes  SHORT TERM GOALS: Target date: 09/09/23  Pt will be provided with and educated on HEP to improve mobility in RUE required for use during ADL completion.   Goal status: IN PROGRESS  2.  Pt will increase RUE A/ROM by 15+ degrees to improve ability to use RUE during dressing tasks with minimal compensatory techniques.   Goal  status: IN PROGRESS  3.  Pt will increase RUE strength to 3+/5 to improve ability to reach for items at waist to chest height during bathing and grooming tasks.   Goal status: IN PROGRESS  4.  Pt will demonstrate decreased edema by at least 25% to improve ability to perform joint mobility tasks and improve functional use of the RUE during grooming tasks.    Goal status: IN PROGRESS    LONG TERM GOALS: Target date: 10/09/23  Pt will decrease pain in RUE to 3/10 or less to improve ability to perform HEP at recommended frequency and duration to improve mobility of RUE required for ADL completion.   Goal status: IN PROGRESS  2.  Pt will decrease RUE fascial restrictions to min amounts or less to improve mobility required for functional reaching tasks.   Goal status: IN PROGRESS  3.  Pt will increase RUE A/ROM by 30+ degrees to improve ability to use RUE when reaching overhead or behind back during dressing and bathing tasks.   Goal status: IN PROGRESS  4.  Pt will increase RUE strength to 4/5 or greater to improve ability to use RUE when lifting or carrying items during meal preparation/housework/yardwork tasks.   Goal status: IN PROGRESS   5.  Pt will increase RUE grip strength by 30# or greater to improve ability to use RUE when grasping and manipulating items for meal preparation or housework.   Goal status: IN PROGRESS    ASSESSMENT:  CLINICAL IMPRESSION: Pt reports HEP is going well, she has been competing her updated HEP 2-3x/day, and has removed her ace bandage at home. She is now only wearing the sleeve and puts the splint on when she leaves the house. She was able to sweep her floor over the weekend. Continued with manual techniques and passive stretching, noting improvement in passive and active elbow flexion today. Added functional reaching with pinch tree and UBE today. Verbal cuing for form and technique.   PERFORMANCE DEFICITS: in functional skills including ADLs,  IADLs, coordination, dexterity, proprioception, sensation, edema, ROM, strength, pain, fascial restrictions, muscle spasms, flexibility, UE functional use, and vestibular     PLAN:  OT FREQUENCY: 2x/week  OT DURATION: 8 weeks  PLANNED INTERVENTIONS: 97168 OT Re-evaluation, 97535 self care/ADL training, 11914 therapeutic exercise, 97530 therapeutic activity, 97112 neuromuscular re-education, 97140 manual therapy, 97035 ultrasound, 97018 paraffin, 78295 electrical stimulation unattended, patient/family education, and DME and/or AE instructions  CONSULTED AND AGREED WITH PLAN OF CARE: Patient  PLAN FOR NEXT SESSION: Follow up on HEP, progress note, continue with manual techniques, P/ROM, AA/ROM, and A/ROM   Ezra Sites, OTR/L  [phone] 09/05/2023, 2:28 PM

## 2023-09-06 ENCOUNTER — Encounter (HOSPITAL_COMMUNITY): Admitting: Occupational Therapy

## 2023-09-08 ENCOUNTER — Encounter (HOSPITAL_COMMUNITY): Admitting: Occupational Therapy

## 2023-09-09 ENCOUNTER — Ambulatory Visit (HOSPITAL_COMMUNITY): Attending: Physician Assistant | Admitting: Occupational Therapy

## 2023-09-09 ENCOUNTER — Encounter (HOSPITAL_COMMUNITY): Payer: Self-pay | Admitting: Occupational Therapy

## 2023-09-09 DIAGNOSIS — R29898 Other symptoms and signs involving the musculoskeletal system: Secondary | ICD-10-CM | POA: Diagnosis not present

## 2023-09-09 DIAGNOSIS — R278 Other lack of coordination: Secondary | ICD-10-CM | POA: Diagnosis not present

## 2023-09-09 DIAGNOSIS — M25521 Pain in right elbow: Secondary | ICD-10-CM | POA: Insufficient documentation

## 2023-09-09 DIAGNOSIS — M25621 Stiffness of right elbow, not elsewhere classified: Secondary | ICD-10-CM | POA: Diagnosis not present

## 2023-09-09 NOTE — Therapy (Signed)
 OUTPATIENT OCCUPATIONAL THERAPY ORTHO TREATMENT AND PROGRESS NOTE  Progress Note  Reporting Period 08/10/23 to 10/05/23  See note below for Objective Data and Assessment of Progress/Goals.    Patient Name: Caitlyn Boone MRN: [id number] DOB:[birth year], 83 y.o., female Today's Date: 09/09/2023    END OF SESSION:  OT End of Session - 09/09/23 1137     Visit Number 10    Number of Visits 16    Date for OT Re-Evaluation 10/09/23    Authorization Type [employer] Medicare, $20 copay    Authorization Time Period 16 visits approved 08/10/23-10/05/23    Authorization - Visit Number 10    Authorization - Number of Visits 16    Progress Note Due on Visit 10    OT Start Time 1058    OT Stop Time 1140    OT Time Calculation (min) 42 min    Activity Tolerance Patient tolerated treatment well    Behavior During Therapy WFL for tasks assessed/performed                      Past Medical History:  Diagnosis Date   Compression fracture 05/24/2013   L1   GERD (gastroesophageal reflux disease)    Gout    Hyperlipidemia    Hypertension    Knee fracture, left feb 2015   no surgery done   Osteoporosis    Past Surgical History:  Procedure Laterality Date   ABDOMINAL HYSTERECTOMY  1992   complete   CHOLECYSTECTOMY N/A 06/28/2014   Procedure: LAPAROSCOPIC CHOLECYSTECTOMY ;  Surgeon: Claud Kelp, MD;  Location: [facility];  Service: General;  Laterality: N/A;   EYE SURGERY Bilateral 2013   lens for cataracts   LAMINECTOMY  1987   L5-S1   TONSILLECTOMY  1961   Patient Active Problem List   Diagnosis Date Noted   Anxiety 01/26/2023   Hypertriglyceridemia 08/18/2022   Elevated blood sugar 08/18/2022   Closed nondisplaced fracture of left patella 03/09/2018   Obesity (BMI 30.0-34.9) 02/16/2016   Gallstones 06/28/2014   Hyperlipidemia 02/19/2014   Osteoporosis 02/19/2014   Essential hypertension 02/19/2014   GERD (gastroesophageal reflux disease) 02/19/2014   Gout 02/19/2014   PCP:  Dr. Ivin Booty Dettinger REFERRING PROVIDER: Malachy Mood, PA-C  ONSET DATE: 07/21/23  REFERRING DIAG: s/p ORIF comminuted articular right distal humerus fracture  THERAPY DIAG:  Pain in right elbow  Stiffness of right elbow, not elsewhere classified  Other lack of coordination  Other symptoms and signs involving the musculoskeletal system  Rationale for Evaluation and Treatment: Rehabilitation  SUBJECTIVE:   SUBJECTIVE STATEMENT: S: It feels less sore than last time.  Pt accompanied by: family member and step-son (caregiver)  PERTINENT HISTORY: Pt is an 83 y/o female s/p ORIF comminuted articular right distal humerus fracture on 07/21/23. Pt sustained injury when her foot caught on the threshold between 2 rooms and she fell. Pt presents in posterior long arm splint and sling today. Step-son present as caregiver.   PRECAUTIONS: Other: Strictly NWB; ok to begin A/ROM at 2 weeks, wear splint until follow up with MD at 6 weeks; follow [place] Handbook Protocol    WEIGHT BEARING RESTRICTIONS: Yes NWB  PAIN:  Are you having pain? No  FALLS: Has patient fallen in last 6 months? Yes. Number of falls 1  PLOF: Independent  PATIENT GOALS: To be able to use the RUE.   NEXT MD VISIT: unsure  OBJECTIVE:  Note: Objective measures were completed at Evaluation unless otherwise noted.  HAND DOMINANCE: Right  ADLs: Pt is unable to use the dominant RUE for any ADLs, cannot reach overhead or behind back. Pt with min difficulty sleeping.    FUNCTIONAL OUTCOME MEASURES: Upper Extremity Functional Scale (UEFS): 16/80  UPPER EXTREMITY ROM:       Eval: Assessed in sitting, unable to assess P/ROM due to protocol   08/26/23: Assessed in sitting, er/IR adducted  Active ROM Right eval Right 08/26/23  Shoulder flexion 69 76  Shoulder abduction 56 58  Shoulder internal rotation 90 90  Shoulder external rotation 19 44  Elbow flexion 115 104  Elbow extension 36 45  Wrist flexion 32 40  Wrist  extension 28 58  Wrist ulnar deviation 40   Wrist radial deviation 10 22  Wrist pronation 90   Wrist supination 40 80  (Blank rows = not tested)   UPPER EXTREMITY MMT:       Eval: Assessed on observation due to precautions limiting formal MMT   08/26/23: Assessed seated, er/IR adducted  MMT Right eval Right 08/26/23  Shoulder flexion 3-/5 3-/5  Shoulder abduction 3-/5 3-/5  Shoulder internal rotation 3/5 3+/5  Shoulder external rotation 3-/5 3/5  Elbow flexion 3-/5 3+/5  Elbow extension 3-/5 3+/5  Wrist flexion 3/5 4-/5  Wrist extension 3/5 4-/5  Wrist ulnar deviation 3/5 4-/5  Wrist radial deviation 3/5 4+/5  Wrist pronation 3/5 4-/5  Wrist supination 3-/5 4-/5  (Blank rows = not tested)  HAND FUNCTION: Grip strength: Right: 5 lbs; Left: 40 lbs 08/26/23: Right: 18 lbs  SENSATION: Tingling in fingers along ulnar nerve distribution  EDEMA:  Eval     Right:    Left:  Wrist     15.75cm  15.0cm Mid forearm(10cm from wrist)  16.5cm  16.0cm Elbow    27cm   24cm Mid upper arm (10cm from elbow) 30.5cm  27.5cm  08/26/23:      Right:      Wrist     15.25cm   Mid forearm(10cm from wrist)  16.5cm   Elbow    25.75cm    Mid upper arm (10cm from elbow) 28.5cm       OBSERVATIONS: moderate edema and fascial restrictions along RUE forearm and elbow, min edema in fingers and wrist   TREATMENT DATE:  09/09/23 -Retrograde massage and myofascial release to right forearm, volar elbow, upper arm, and trapezius regions to decrease edema and improve joint ROM -P/ROM: shoulder flexion, abduction; elbow flexion/extension 5 reps -A/ROM: supine-shoulder protraction, flexion, horizontal abduction, er, 10 reps -AA/ROM: sitting-elbow flexion and extension, 12 reps  -A/ROM: sitting-shoulder protraction, flexion, er, abduction; elbow flexion/extension, 10 reps -Functional reaching: pinch tree-pt placing red and yellow clothespin along vertical bar of pinch tree, task focusing on shoulder  flexion and protraction, as well as elbow extension -UBE: level 1, 2' forward  09/05/23 -Retrograde massage and myofascial release to right forearm, volar elbow, upper arm, and trapezius regions to decrease edema and improve joint ROM -P/ROM: shoulder flexion, abduction; elbow flexion/extension 5 reps -A/ROM: supine-shoulder protraction, flexion, horizontal abduction, er, 10 reps -AA/ROM: sitting- elbow flexion and extension, 12 reps  -A/ROM: sitting-shoulder protraction, flexion, er, abduction; elbow flexion/extension, 10 reps -Wall wash: 1' flexion -Pulleys: 1' flexion -Functional reaching: pinch tree-pt placing red and yellow clothespin along vertical bar of pinch tree, task focusing on shoulder flexion and protraction, as well as elbow extension; completed all but 1 clothespin -UBE: level 1, 2' forward  08/31/23 -Retrograde massage and myofascial release to right forearm, volar elbow,  upper arm, and trapezius regions to decrease edema and improve joint ROM -P/ROM: shoulder flexion, abduction; elbow flexion/extension 10 reps -AA/ROM: supine-protraction, shoulder flexion, horizontal abduction, er, 10 reps -AA/ROM: sitting-elbow flexion and extension, 10 reps  -AA/ROM: sitting-shoulder protraction, flexion, er, abduction, 10 reps -A/ROM: sitting-elbow flexion, extension, 10 reps -Wall wash: 1' flexion     PATIENT EDUCATION: Education details: AA/ROM for shoulder (except horizontal abduction) and elbow Person educated: Patient and Caregiver step-son Education method: Explanation, Demonstration, and Handouts Education comprehension: verbalized understanding and returned demonstration  HOME EXERCISE PROGRAM: Eval: elbow and wrist A/ROM 08/18/23: Table slides 09/01/23: AA/ROM shoulder and elbow   GOALS: Goals reviewed with patient? Yes  SHORT TERM GOALS: Target date: 09/09/23  Pt will be provided with and educated on HEP to improve mobility in RUE required for use during ADL  completion.   Goal status: IN PROGRESS  2.  Pt will increase RUE A/ROM by 15+ degrees to improve ability to use RUE during dressing tasks with minimal compensatory techniques.   Goal status: IN PROGRESS  3.  Pt will increase RUE strength to 3+/5 to improve ability to reach for items at waist to chest height during bathing and grooming tasks.   Goal status: IN PROGRESS  4.  Pt will demonstrate decreased edema by at least 25% to improve ability to perform joint mobility tasks and improve functional use of the RUE during grooming tasks.    Goal status: IN PROGRESS    LONG TERM GOALS: Target date: 10/09/23  Pt will decrease pain in RUE to 3/10 or less to improve ability to perform HEP at recommended frequency and duration to improve mobility of RUE required for ADL completion.   Goal status: IN PROGRESS  2.  Pt will decrease RUE fascial restrictions to min amounts or less to improve mobility required for functional reaching tasks.   Goal status: IN PROGRESS  3.  Pt will increase RUE A/ROM by 30+ degrees to improve ability to use RUE when reaching overhead or behind back during dressing and bathing tasks.   Goal status: IN PROGRESS  4.  Pt will increase RUE strength to 4/5 or greater to improve ability to use RUE when lifting or carrying items during meal preparation/housework/yardwork tasks.   Goal status: IN PROGRESS   5.  Pt will increase RUE grip strength by 30# or greater to improve ability to use RUE when grasping and manipulating items for meal preparation or housework.   Goal status: IN PROGRESS    ASSESSMENT:  CLINICAL IMPRESSION: Pt reports no pain again across session. She demonstrated great improvement with functional reach task placing clips onto vertical pole- red clips and then green clips completing all. Ended session in UE bike to improve endurance. Pt making progress towards goals and would continue to benefit from OT services.   PERFORMANCE DEFICITS: in  functional skills including ADLs, IADLs, coordination, dexterity, proprioception, sensation, edema, ROM, strength, pain, fascial restrictions, muscle spasms, flexibility, UE functional use, and vestibular     PLAN:  OT FREQUENCY: 2x/week  OT DURATION: 8 weeks  PLANNED INTERVENTIONS: 97168 OT Re-evaluation, 97535 self care/ADL training, 13086 therapeutic exercise, 97530 therapeutic activity, 97112 neuromuscular re-education, 97140 manual therapy, 97035 ultrasound, 97018 paraffin, 57846 electrical stimulation unattended, patient/family education, and DME and/or AE instructions  CONSULTED AND AGREED WITH PLAN OF CARE: Patient  PLAN FOR NEXT SESSION: Follow up on HEP, progress note, continue with manual techniques, P/ROM, AA/ROM, and A/ROM   Lurena Joiner, OTR/L  [phone] 09/09/2023, 11:39  AM

## 2023-09-12 ENCOUNTER — Ambulatory Visit (INDEPENDENT_AMBULATORY_CARE_PROVIDER_SITE_OTHER): Payer: Medicare Other

## 2023-09-12 VITALS — Ht 63.0 in | Wt 191.0 lb

## 2023-09-12 DIAGNOSIS — Z0000 Encounter for general adult medical examination without abnormal findings: Secondary | ICD-10-CM | POA: Diagnosis not present

## 2023-09-12 NOTE — Progress Notes (Signed)
 Subjective:   Caitlyn Boone is a 83 y.o. who presents for a Medicare Wellness preventive visit.  Visit Complete: Virtual I connected with  Caitlyn Boone on 09/12/23 by a audio enabled telemedicine application and verified that I am speaking with the correct person using two identifiers.  Patient Location: Home  Provider Location: Home Office  I discussed the limitations of evaluation and management by telemedicine. The patient expressed understanding and agreed to proceed.  Vital Signs: Because this visit was a virtual/telehealth visit, some criteria may be missing or patient reported. Any vitals not documented were not able to be obtained and vitals that have been documented are patient reported.  VideoDeclined- This patient declined Librarian, academic. Therefore the visit was completed with audio only.  Persons Participating in Visit: Patient.  AWV Questionnaire: Yes: Patient Medicare AWV questionnaire was completed by the patient on 09/12/23; I have confirmed that all information answered by patient is correct and no changes since this date.  Cardiac Risk Factors include: advanced age (>24men, >12 women);hypertension;dyslipidemia     Objective:    Today's Vitals   09/12/23 1356  Weight: 191 lb (86.6 kg)  Height: 5\' 3"  (1.6 m)   Body mass index is 33.83 kg/m.     09/12/2023    2:07 PM 08/10/2023   11:01 AM 09/09/2022    9:24 AM 09/07/2021    9:14 AM 09/03/2020    9:00 AM 09/03/2019    8:50 AM 12/28/2017   11:05 AM  Advanced Directives  Does Patient Have a Medical Advance Directive? No Yes No Yes No Yes Yes  Type of Pharmacist, hospital Power of [employer] Power of Attorney  Does patient want to make changes to medical advance directive?       No - Patient declined  Copy of Healthcare Power of Attorney in Chart?  No - copy requested  No - copy requested  No - copy requested  Yes  Would patient like information on creating a medical advance directive? Yes (MAU/Ambulatory/Procedural Areas - Information given)  No - Patient declined  No - Patient declined  No - Patient declined    Current Medications (verified) Outpatient Encounter Medications as of 09/12/2023  Medication Sig   alendronate (FOSAMAX) 70 MG tablet Take 1 tablet (70 mg total) by mouth every 7 (seven) days. Take with a full glass of water on an empty stomach.   allopurinol (ZYLOPRIM) 300 MG tablet Take 1 tablet by mouth once daily   amLODipine (NORVASC) 5 MG tablet Take 1 tablet (5 mg total) by mouth daily.   aspirin 325 MG tablet Take 1 tablet by mouth daily.   Calcium Citrate 1040 MG TABS Take by mouth.   Cholecalciferol (VITAMIN D) 2000 UNITS CAPS Take 1 capsule by mouth daily.   ciclopirox (PENLAC) 8 % solution Apply topically at bedtime. Apply over nail and surrounding skin. Apply daily over previous coat. After seven (7) days, may remove with alcohol and continue cycle.   DULoxetine (CYMBALTA) 30 MG capsule Take 1 capsule by mouth once daily   furosemide (LASIX) 20 MG tablet Take 1 tablet (20 mg total) by mouth daily as needed.   hydrochlorothiazide (MICROZIDE) 12.5 MG capsule Take 1 capsule by mouth once daily   lisinopril (ZESTRIL) 40 MG tablet Take 1 tablet by mouth once daily   loratadine (CLARITIN) 10 MG tablet Take 10 mg by mouth daily.  meclizine (ANTIVERT) 25 MG tablet Take 1 tablet (25 mg total) by mouth 3 (three) times daily as needed for dizziness.   Multiple Vitamin (MULTIVITAMIN WITH MINERALS) TABS tablet Take 1 tablet by mouth daily.   Omega-3 Fatty Acids (FISH OIL) 1000 MG CAPS Take 1 capsule by mouth.   omeprazole (PRILOSEC) 20 MG capsule Take 1 capsule (20 mg total) by mouth daily.   pravastatin (PRAVACHOL) 80 MG tablet Take 1 tablet (80 mg total) by mouth every evening.   No facility-administered encounter medications on file as of 09/12/2023.    Allergies (verified) Patient  has no known allergies.   History: Past Medical History:  Diagnosis Date   Compression fracture 05/24/2013   L1   GERD (gastroesophageal reflux disease)    Gout    Hyperlipidemia    Hypertension    Knee fracture, left feb 2015   no surgery done   Osteoporosis    Past Surgical History:  Procedure Laterality Date   ABDOMINAL HYSTERECTOMY  1992   complete   CHOLECYSTECTOMY N/A 06/28/2014   Procedure: LAPAROSCOPIC CHOLECYSTECTOMY ;  Surgeon: Claud Kelp, MD;  Location: [facility];  Service: General;  Laterality: N/A;   EYE SURGERY Bilateral 2013   lens for cataracts   LAMINECTOMY  1987   L5-S1   TONSILLECTOMY  1961   Family History  Problem Relation Age of Onset   Heart disease Mother    Cancer Father        smoker   Cancer Brother        lung and colon   Colon cancer Brother    Hip fracture Brother    Dementia Brother 48   COPD Brother    Parkinson's disease Brother    Social History   Socioeconomic History   Marital status: Married    Spouse name: Not on file   Number of children: 0   Years of education: 12   Highest education level: 12th grade  Occupational History   Occupation: Retired  Tobacco Use   Smoking status: Former    Current packs/day: 0.00    Types: Cigarettes    Quit date: 02/24/1979    Years since quitting: 44.5   Smokeless tobacco: Never   Tobacco comments:    smoked 2-3 cig a day when smoking, quit long ago  Vaping Use   Vaping status: Never Used  Substance and Sexual Activity   Alcohol use: No    Alcohol/week: 0.0 standard drinks of alcohol   Drug use: No   Sexual activity: Not Currently  Other Topics Concern   Not on file  Social History Narrative   Has 3 step-children - one passed away   Ex son-in-law lives nearby and checks on her often   Social Drivers of Health   Financial Resource Strain: Low Risk  (09/12/2023)   Overall Financial Resource Strain (CARDIA)    Difficulty of Paying Living Expenses: Not hard at all  Food  Insecurity: No Food Insecurity (09/12/2023)   Hunger Vital Sign    Worried About Running Out of Food in the Last Year: Never true    Ran Out of Food in the Last Year: Never true  Transportation Needs: No Transportation Needs (09/12/2023)   PRAPARE - Administrator, Civil Service (Medical): No    Lack of Transportation (Non-Medical): No  Physical Activity: Inactive (09/12/2023)   Exercise Vital Sign    Days of Exercise per Week: 0 days    Minutes of Exercise per Session:  0 min  Stress: No Stress Concern Present (09/12/2023)   [employer] of Occupational Health - Occupational Stress Questionnaire    Feeling of Stress : Not at all  Social Connections: Socially Integrated (09/12/2023)   Social Connection and Isolation Panel [NHANES]    Frequency of Communication with Friends and Family: More than three times a week    Frequency of Social Gatherings with Friends and Family: Once a week    Attends Religious Services: More than 4 times per year    Active Member of [employer] or Organizations: Yes    Attends Engineer, structural: More than 4 times per year    Marital Status: Married    Tobacco Counseling Counseling given: Not Answered Tobacco comments: smoked 2-3 cig a day when smoking, quit long ago    Clinical Intake:  Pre-visit preparation completed: Yes  Pain : No/denies pain     Diabetes: No  Lab Results  Component Value Date   HGBA1C 5.5 08/18/2022     How often do you need to have someone help you when you read instructions, pamphlets, or other written materials from your doctor or pharmacy?: 1 - Never  Interpreter Needed?: No  Information entered by :: Kandis Fantasia LPN   Activities of Daily Living     09/12/2023    5:45 AM  In your present state of health, do you have any difficulty performing the following activities:  Hearing? 0  Vision? 0  Difficulty concentrating or making decisions? 0  Walking or climbing stairs? 1  Dressing or bathing? 0   Doing errands, shopping? 1  Preparing Food and eating ? Y  Using the Toilet? N  In the past six months, have you accidently leaked urine? Y  Do you have problems with loss of bowel control? N  Managing your Medications? N  Managing your Finances? N  Housekeeping or managing your Housekeeping? Y    Patient Care Team: Dettinger, Elige Radon, MD as PCP - General (Family Medicine) Rachael Fee, MD (Inactive) as Attending Physician (Gastroenterology) Mateo Flow, MD as Consulting Physician (Ophthalmology)  Indicate any recent Medical Services you may have received from other than [facility] providers in the past year (date may be approximate).     Assessment:   This is a routine wellness examination for [place].  Hearing/Vision screen Hearing Screening - Comments:: Denies hearing difficulties   Vision Screening - Comments:: Wears rx glasses - up to date with routine eye exams with [facility]     Goals Addressed   None    Depression Screen     09/12/2023    2:03 PM 08/01/2023    2:45 PM 01/26/2023    4:22 PM 09/09/2022    9:23 AM 08/18/2022    1:12 PM 03/19/2022    2:04 PM 02/17/2022   12:55 PM  PHQ 2/9 Scores  PHQ - 2 Score 0 0 0 0 0  0  PHQ- 9 Score   0 0 0  0  Exception Documentation      Patient refusal     Fall Risk    09/12/2023    5:45 AM 08/01/2023    2:45 PM 01/26/2023    4:22 PM 09/09/2022    9:22 AM 08/18/2022    1:12 PM  Fall Risk   Falls in the past year? 1 1 0 0 0  Number falls in past yr: 1 0  0   Injury with Fall? 1 1  0   Risk  for fall due to : Impaired balance/gait;Impaired mobility Impaired balance/gait  No Fall Risks   Follow up Falls prevention discussed;Education provided;Falls evaluation completed Falls evaluation completed  Falls prevention discussed     MEDICARE RISK AT HOME:  Medicare Risk at Home Any stairs in or around the home?: (Patient-Rptd) No If so, are there any without handrails?: No Home free of loose throw rugs in walkways, pet  beds, electrical cords, etc?: (Patient-Rptd) Yes Adequate lighting in your home to reduce risk of falls?: (Patient-Rptd) Yes Life alert?: (Patient-Rptd) Yes Use of a cane, walker or w/c?: (Patient-Rptd) Yes Grab bars in the bathroom?: (Patient-Rptd) Yes Shower chair or bench in shower?: (Patient-Rptd) Yes Elevated toilet seat or a handicapped toilet?: (Patient-Rptd) Yes  TIMED UP AND GO:  Was the test performed?  No  Cognitive Function: 6CIT completed    12/28/2017   11:39 AM  MMSE - Mini Mental State Exam  Orientation to time 5  Orientation to Place 5  Registration 3  Attention/ Calculation 5  Recall 3  Language- name 2 objects 2  Language- repeat 1  Language- follow 3 step command 3  Language- read & follow direction 1  Write a sentence 1  Copy design 1  Total score 30        09/12/2023    2:07 PM 09/09/2022    9:25 AM 09/07/2021    9:16 AM 09/03/2020    9:03 AM 09/03/2019    8:52 AM  6CIT Screen  What Year? 0 points 0 points 0 points 0 points 0 points  What month? 0 points 0 points 0 points 0 points 0 points  What time? 0 points 0 points 0 points 0 points 0 points  Count back from 20 0 points 0 points 0 points 0 points 0 points  Months in reverse 0 points 0 points 0 points 0 points 0 points  Repeat phrase 0 points 0 points 4 points 0 points 0 points  Total Score 0 points 0 points 4 points 0 points 0 points    Immunizations Immunization History  Administered Date(s) Administered   Fluad Quad(high Dose 65+) 04/02/2019, 03/21/2020, 03/23/2021, 03/19/2022   Fluad Trivalent(High Dose 65+) 03/11/2023   Influenza, High Dose Seasonal PF 03/30/2016, 03/21/2017, 03/08/2018   Influenza,inj,Quad PF,6+ Mos 04/02/2014, 05/07/2015   Moderna Sars-Covid-2 Vaccination 06/19/2019, 07/20/2019, 04/10/2020   Pneumococcal Conjugate-13 02/19/2014   Pneumococcal Polysaccharide-23 03/30/2006   Tdap 04/02/2019   Zoster Recombinant(Shingrix) 08/07/2019, 11/15/2019    Screening  Tests Health Maintenance  Topic Date Due   COVID-19 Vaccine (4 - 2024-25 season) 02/06/2023   INFLUENZA VACCINE  01/06/2024   MAMMOGRAM  04/03/2024   Medicare Annual Wellness (AWV)  09/11/2024   DEXA SCAN  09/30/2024   DTaP/Tdap/Td (2 - Td or Tdap) 04/01/2029   Pneumonia Vaccine 67+ Years old  Completed   Zoster Vaccines- Shingrix  Completed   HPV VACCINES  Aged Out    Health Maintenance  Health Maintenance Due  Topic Date Due   COVID-19 Vaccine (4 - 2024-25 season) 02/06/2023    Additional Screening:  Vision Screening: Recommended annual ophthalmology exams for early detection of glaucoma and other disorders of the eye.  Dental Screening: Recommended annual dental exams for proper oral hygiene  Community Resource Referral / Chronic Care Management: CRR required this visit?  No   CCM required this visit?  No     Plan:     I have personally reviewed and noted the following in the patient's chart:  Medical and social history Use of alcohol, tobacco or illicit drugs  Current medications and supplements including opioid prescriptions. Patient is not currently taking opioid prescriptions. Functional ability and status Nutritional status Physical activity Advanced directives List of other physicians Hospitalizations, surgeries, and ER visits in previous 12 months Vitals Screenings to include cognitive, depression, and falls Referrals and appointments  In addition, I have reviewed and discussed with patient certain preventive protocols, quality metrics, and best practice recommendations. A written personalized care plan for preventive services as well as general preventive health recommendations were provided to patient.     Kandis Fantasia [place], [place]   06/12/1094   After Visit Summary: (MyChart) Due to this being a telephonic visit, the after visit summary with patients personalized plan was offered to patient via MyChart   Notes: Nothing significant to report  at this time.

## 2023-09-12 NOTE — Patient Instructions (Signed)
 Caitlyn Boone , Thank you for taking time to come for your Medicare Wellness Visit. I appreciate your ongoing commitment to your health goals. Please review the following plan we discussed and let me know if I can assist you in the future.   Referrals/Orders/Follow-Ups/Clinician Recommendations: Aim for 30 minutes of exercise or brisk walking, 6-8 glasses of water, and 5 servings of fruits and vegetables each day.  This is a list of the screening recommended for you and due dates:  Health Maintenance  Topic Date Due   COVID-19 Vaccine (4 - 2024-25 season) 02/06/2023   Flu Shot  01/06/2024   Mammogram  04/03/2024   Medicare Annual Wellness Visit  09/11/2024   DEXA scan (bone density measurement)  09/30/2024   DTaP/Tdap/Td vaccine (2 - Td or Tdap) 04/01/2029   Pneumonia Vaccine  Completed   Zoster (Shingles) Vaccine  Completed   HPV Vaccine  Aged Out    Advanced directives: (ACP Link)Information on Advanced Care Planning can be found at [facility] Advance Health Care Directives Advance Health Care Directives. [email or web address]   Next Medicare Annual Wellness Visit scheduled for next year: Yes

## 2023-09-13 ENCOUNTER — Ambulatory Visit (HOSPITAL_COMMUNITY): Admitting: Occupational Therapy

## 2023-09-13 ENCOUNTER — Encounter (HOSPITAL_COMMUNITY): Payer: Self-pay | Admitting: Occupational Therapy

## 2023-09-13 DIAGNOSIS — M25621 Stiffness of right elbow, not elsewhere classified: Secondary | ICD-10-CM

## 2023-09-13 DIAGNOSIS — R278 Other lack of coordination: Secondary | ICD-10-CM

## 2023-09-13 DIAGNOSIS — M25521 Pain in right elbow: Secondary | ICD-10-CM | POA: Diagnosis not present

## 2023-09-13 DIAGNOSIS — R29898 Other symptoms and signs involving the musculoskeletal system: Secondary | ICD-10-CM | POA: Diagnosis not present

## 2023-09-13 NOTE — Therapy (Signed)
 OUTPATIENT OCCUPATIONAL THERAPY ORTHO TREATMENT   Patient Name: Caitlyn Boone MRN: [id number] DOB:Oct 31, 1940, 83 y.o., female Today's Date: 09/13/2023    END OF SESSION:  OT End of Session - 09/13/23 1206     Visit Number 11    Number of Visits 16    Date for OT Re-Evaluation 10/09/23    Authorization Type [employer] Medicare, $20 copay    Authorization Time Period 16 visits approved 08/10/23-10/05/23    Authorization - Visit Number 11    Authorization - Number of Visits 16    Progress Note Due on Visit 20    OT Start Time 1019    OT Stop Time 1059    OT Time Calculation (min) 40 min    Activity Tolerance Patient tolerated treatment well    Behavior During Therapy WFL for tasks assessed/performed                       Past Medical History:  Diagnosis Date   Compression fracture 05/24/2013   L1   GERD (gastroesophageal reflux disease)    Gout    Hyperlipidemia    Hypertension    Knee fracture, left feb 2015   no surgery done   Osteoporosis    Past Surgical History:  Procedure Laterality Date   ABDOMINAL HYSTERECTOMY  1992   complete   CHOLECYSTECTOMY N/A 06/28/2014   Procedure: LAPAROSCOPIC CHOLECYSTECTOMY ;  Surgeon: Claud Kelp, MD;  Location: [facility];  Service: General;  Laterality: N/A;   EYE SURGERY Bilateral 2013   lens for cataracts   LAMINECTOMY  1987   L5-S1   TONSILLECTOMY  1961   Patient Active Problem List   Diagnosis Date Noted   Anxiety 01/26/2023   Hypertriglyceridemia 08/18/2022   Elevated blood sugar 08/18/2022   Closed nondisplaced fracture of left patella 03/09/2018   Obesity (BMI 30.0-34.9) 02/16/2016   Gallstones 06/28/2014   Hyperlipidemia 02/19/2014   Osteoporosis 02/19/2014   Essential hypertension 02/19/2014   GERD (gastroesophageal reflux disease) 02/19/2014   Gout 02/19/2014   PCP: Dr. Ivin Booty Dettinger REFERRING PROVIDER: Malachy Mood, PA-C  ONSET DATE: 07/21/23  REFERRING DIAG: s/p ORIF comminuted articular right  distal humerus fracture  THERAPY DIAG:  Pain in right elbow  Stiffness of right elbow, not elsewhere classified  Other lack of coordination  Other symptoms and signs involving the musculoskeletal system  Rationale for Evaluation and Treatment: Rehabilitation  SUBJECTIVE:   SUBJECTIVE STATEMENT: S: All the strips came off.  Pt accompanied by: family member and step-son (caregiver)  PERTINENT HISTORY: Pt is an 83 y/o female s/p ORIF comminuted articular right distal humerus fracture on 07/21/23. Pt sustained injury when her foot caught on the threshold between 2 rooms and she fell. Pt presents in posterior long arm splint and sling today. Step-son present as caregiver.   PRECAUTIONS: Other: Strictly NWB; ok to begin A/ROM at 2 weeks, wear splint until follow up with MD at 6 weeks; follow [place] Handbook Protocol    WEIGHT BEARING RESTRICTIONS: Yes NWB  PAIN:  Are you having pain? No  FALLS: Has patient fallen in last 6 months? Yes. Number of falls 1  PLOF: Independent  PATIENT GOALS: To be able to use the RUE.   NEXT MD VISIT: unsure  OBJECTIVE:  Note: Objective measures were completed at Evaluation unless otherwise noted.  HAND DOMINANCE: Right  ADLs: Pt is unable to use the dominant RUE for any ADLs, cannot reach overhead or behind back. Pt with min  difficulty sleeping.    FUNCTIONAL OUTCOME MEASURES: Upper Extremity Functional Scale (UEFS): 16/80  UPPER EXTREMITY ROM:       Eval: Assessed in sitting, unable to assess P/ROM due to protocol   08/26/23: Assessed in sitting, er/IR adducted  Active ROM Right eval Right 08/26/23  Shoulder flexion 69 76  Shoulder abduction 56 58  Shoulder internal rotation 90 90  Shoulder external rotation 19 44  Elbow flexion 115 104  Elbow extension 36 45  Wrist flexion 32 40  Wrist extension 28 58  Wrist ulnar deviation 40   Wrist radial deviation 10 22  Wrist pronation 90   Wrist supination 40 80  (Blank rows = not  tested)   UPPER EXTREMITY MMT:       Eval: Assessed on observation due to precautions limiting formal MMT   08/26/23: Assessed seated, er/IR adducted  MMT Right eval Right 08/26/23  Shoulder flexion 3-/5 3-/5  Shoulder abduction 3-/5 3-/5  Shoulder internal rotation 3/5 3+/5  Shoulder external rotation 3-/5 3/5  Elbow flexion 3-/5 3+/5  Elbow extension 3-/5 3+/5  Wrist flexion 3/5 4-/5  Wrist extension 3/5 4-/5  Wrist ulnar deviation 3/5 4-/5  Wrist radial deviation 3/5 4+/5  Wrist pronation 3/5 4-/5  Wrist supination 3-/5 4-/5  (Blank rows = not tested)  HAND FUNCTION: Grip strength: Right: 5 lbs; Left: 40 lbs 08/26/23: Right: 18 lbs  SENSATION: Tingling in fingers along ulnar nerve distribution  EDEMA:  Eval     Right:    Left:  Wrist     15.75cm  15.0cm Mid forearm(10cm from wrist)  16.5cm  16.0cm Elbow    27cm   24cm Mid upper arm (10cm from elbow) 30.5cm  27.5cm  08/26/23:      Right:      Wrist     15.25cm   Mid forearm(10cm from wrist)  16.5cm   Elbow    25.75cm    Mid upper arm (10cm from elbow) 28.5cm       OBSERVATIONS: moderate edema and fascial restrictions along RUE forearm and elbow, min edema in fingers and wrist   TREATMENT DATE:  09/13/23 -Retrograde massage and myofascial release to right forearm, volar elbow, upper arm, and trapezius regions to decrease edema and improve joint ROM -P/ROM: shoulder flexion, abduction; elbow flexion/extension 5 reps -A/ROM: supine-shoulder protraction, flexion, horizontal abduction, er, 12 reps -Functional reaching: pt standing at countertop, placing 10 cones on bottom shelf of overhead cabinet in flexion, removing in abduction -A/ROM: sitting-bicep curl, hammer curl, 10 reps -A/ROM: sitting-shoulder protraction, flexion, 10 reps -Elbow flexion stretch: leaning onto forearms on countertop progressing elbow flexion as able to tolerate, 3x10" holds -Touching thumb to chin, 10 reps  09/09/23 -Retrograde massage and  myofascial release to right forearm, volar elbow, upper arm, and trapezius regions to decrease edema and improve joint ROM -P/ROM: shoulder flexion, abduction; elbow flexion/extension 5 reps -A/ROM: supine-shoulder protraction, flexion, horizontal abduction, er, 10 reps -AA/ROM: sitting-elbow flexion and extension, 12 reps  -A/ROM: sitting-shoulder protraction, flexion, er, abduction; elbow flexion/extension, 10 reps -Functional reaching: pinch tree-pt placing red and yellow clothespin along vertical bar of pinch tree, task focusing on shoulder flexion and protraction, as well as elbow extension -UBE: level 1, 2' forward  09/05/23 -Retrograde massage and myofascial release to right forearm, volar elbow, upper arm, and trapezius regions to decrease edema and improve joint ROM -P/ROM: shoulder flexion, abduction; elbow flexion/extension 5 reps -A/ROM: supine-shoulder protraction, flexion, horizontal abduction, er, 10 reps -AA/ROM: sitting- elbow  flexion and extension, 12 reps  -A/ROM: sitting-shoulder protraction, flexion, er, abduction; elbow flexion/extension, 10 reps -Wall wash: 1' flexion -Pulleys: 1' flexion -Functional reaching: pinch tree-pt placing red and yellow clothespin along vertical bar of pinch tree, task focusing on shoulder flexion and protraction, as well as elbow extension; completed all but 1 clothespin -UBE: level 1, 2' forward      PATIENT EDUCATION: Education details: touching thumb to chin or nose Person educated: Patient and Caregiver step-son Education method: Explanation, Demonstration, and Handouts Education comprehension: verbalized understanding and returned demonstration  HOME EXERCISE PROGRAM: Eval: elbow and wrist A/ROM 08/18/23: Table slides 09/01/23: AA/ROM shoulder and elbow 09/13/23: Touching thumb to chin or nose   GOALS: Goals reviewed with patient? Yes  SHORT TERM GOALS: Target date: 09/09/23  Pt will be provided with and educated on HEP to  improve mobility in RUE required for use during ADL completion.   Goal status: IN PROGRESS  2.  Pt will increase RUE A/ROM by 15+ degrees to improve ability to use RUE during dressing tasks with minimal compensatory techniques.   Goal status: IN PROGRESS  3.  Pt will increase RUE strength to 3+/5 to improve ability to reach for items at waist to chest height during bathing and grooming tasks.   Goal status: IN PROGRESS  4.  Pt will demonstrate decreased edema by at least 25% to improve ability to perform joint mobility tasks and improve functional use of the RUE during grooming tasks.    Goal status: IN PROGRESS    LONG TERM GOALS: Target date: 10/09/23  Pt will decrease pain in RUE to 3/10 or less to improve ability to perform HEP at recommended frequency and duration to improve mobility of RUE required for ADL completion.   Goal status: IN PROGRESS  2.  Pt will decrease RUE fascial restrictions to min amounts or less to improve mobility required for functional reaching tasks.   Goal status: IN PROGRESS  3.  Pt will increase RUE A/ROM by 30+ degrees to improve ability to use RUE when reaching overhead or behind back during dressing and bathing tasks.   Goal status: IN PROGRESS  4.  Pt will increase RUE strength to 4/5 or greater to improve ability to use RUE when lifting or carrying items during meal preparation/housework/yardwork tasks.   Goal status: IN PROGRESS   5.  Pt will increase RUE grip strength by 30# or greater to improve ability to use RUE when grasping and manipulating items for meal preparation or housework.   Goal status: IN PROGRESS    ASSESSMENT:  CLINICAL IMPRESSION: Pt reports she is doing her exercises, has no pain, and her steri-strips have been removed. Pt continues to have slight improvements in elbow flexion each session. At beginning of session today was unable to touch her chin or nose, at end of session touched chin for the first time! Added  elbow flexion stretch at countertop, pt completing with no increased pain. Continued with shoulder ROM and strengthening incorporating elbow flexion and extension during tasks. Verbal cuing for form and technique.   PERFORMANCE DEFICITS: in functional skills including ADLs, IADLs, coordination, dexterity, proprioception, sensation, edema, ROM, strength, pain, fascial restrictions, muscle spasms, flexibility, UE functional use, and vestibular     PLAN:  OT FREQUENCY: 2x/week  OT DURATION: 8 weeks  PLANNED INTERVENTIONS: 97168 OT Re-evaluation, 97535 self care/ADL training, 69629 therapeutic exercise, 97530 therapeutic activity, 97112 neuromuscular re-education, 97140 manual therapy, 97035 ultrasound, 97018 paraffin, 52841 electrical stimulation unattended, patient/family  education, and DME and/or AE instructions  CONSULTED AND AGREED WITH PLAN OF CARE: Patient  PLAN FOR NEXT SESSION: Follow up on HEP, continue with manual techniques, P/ROM, AA/ROM, and A/ROM, touching nose or chin for progress towards eating with the RUE   Ezra Sites, OTR/L  [phone] 09/13/2023, 12:09 PM

## 2023-09-15 ENCOUNTER — Ambulatory Visit (HOSPITAL_COMMUNITY): Admitting: Occupational Therapy

## 2023-09-15 ENCOUNTER — Encounter (HOSPITAL_COMMUNITY): Payer: Self-pay | Admitting: Occupational Therapy

## 2023-09-15 DIAGNOSIS — R278 Other lack of coordination: Secondary | ICD-10-CM | POA: Diagnosis not present

## 2023-09-15 DIAGNOSIS — M25521 Pain in right elbow: Secondary | ICD-10-CM | POA: Diagnosis not present

## 2023-09-15 DIAGNOSIS — R29898 Other symptoms and signs involving the musculoskeletal system: Secondary | ICD-10-CM

## 2023-09-15 DIAGNOSIS — M25621 Stiffness of right elbow, not elsewhere classified: Secondary | ICD-10-CM

## 2023-09-15 NOTE — Therapy (Signed)
 OUTPATIENT OCCUPATIONAL THERAPY ORTHO TREATMENT   Patient Name: Caitlyn Boone MRN: [id number] DOB:11-16-1940, 83 y.o., female Today's Date: 09/15/2023    END OF SESSION:  OT End of Session - 09/15/23 1146     Visit Number 12    Number of Visits 16    Date for OT Re-Evaluation 10/09/23    Authorization Type [employer] Medicare, $20 copay    Authorization Time Period 16 visits approved 08/10/23-10/05/23    Authorization - Visit Number 12    Authorization - Number of Visits 16    Progress Note Due on Visit 20    OT Start Time 1101    OT Stop Time 1144    OT Time Calculation (min) 43 min    Activity Tolerance Patient tolerated treatment well    Behavior During Therapy WFL for tasks assessed/performed                        Past Medical History:  Diagnosis Date   Compression fracture 05/24/2013   L1   GERD (gastroesophageal reflux disease)    Gout    Hyperlipidemia    Hypertension    Knee fracture, left feb 2015   no surgery done   Osteoporosis    Past Surgical History:  Procedure Laterality Date   ABDOMINAL HYSTERECTOMY  1992   complete   CHOLECYSTECTOMY N/A 06/28/2014   Procedure: LAPAROSCOPIC CHOLECYSTECTOMY ;  Surgeon: Claud Kelp, MD;  Location: [facility];  Service: General;  Laterality: N/A;   EYE SURGERY Bilateral 2013   lens for cataracts   LAMINECTOMY  1987   L5-S1   TONSILLECTOMY  1961   Patient Active Problem List   Diagnosis Date Noted   Anxiety 01/26/2023   Hypertriglyceridemia 08/18/2022   Elevated blood sugar 08/18/2022   Closed nondisplaced fracture of left patella 03/09/2018   Obesity (BMI 30.0-34.9) 02/16/2016   Gallstones 06/28/2014   Hyperlipidemia 02/19/2014   Osteoporosis 02/19/2014   Essential hypertension 02/19/2014   GERD (gastroesophageal reflux disease) 02/19/2014   Gout 02/19/2014   PCP: Dr. Ivin Booty Dettinger REFERRING PROVIDER: Malachy Mood, PA-C  ONSET DATE: 07/21/23  REFERRING DIAG: s/p ORIF comminuted articular right  distal humerus fracture  THERAPY DIAG:  Pain in right elbow  Stiffness of right elbow, not elsewhere classified  Other lack of coordination  Other symptoms and signs involving the musculoskeletal system  Rationale for Evaluation and Treatment: Rehabilitation  SUBJECTIVE:   SUBJECTIVE STATEMENT: S: "I can almost get to my nose."  Pt accompanied by: family member and step-son (caregiver)  PERTINENT HISTORY: Pt is an 83 y/o female s/p ORIF comminuted articular right distal humerus fracture on 07/21/23. Pt sustained injury when her foot caught on the threshold between 2 rooms and she fell. Pt presents in posterior long arm splint and sling today. Step-son present as caregiver.   PRECAUTIONS: Other: Strictly NWB; ok to begin A/ROM at 2 weeks, wear splint until follow up with MD at 6 weeks; follow [place] Handbook Protocol    WEIGHT BEARING RESTRICTIONS: Yes NWB  PAIN:  Are you having pain? No  FALLS: Has patient fallen in last 6 months? Yes. Number of falls 1  PLOF: Independent  PATIENT GOALS: To be able to use the RUE.   NEXT MD VISIT: unsure  OBJECTIVE:  Note: Objective measures were completed at Evaluation unless otherwise noted.  HAND DOMINANCE: Right  ADLs: Pt is unable to use the dominant RUE for any ADLs, cannot reach overhead or behind back.  Pt with min difficulty sleeping.    FUNCTIONAL OUTCOME MEASURES: Upper Extremity Functional Scale (UEFS): 16/80  UPPER EXTREMITY ROM:       Eval: Assessed in sitting, unable to assess P/ROM due to protocol   08/26/23: Assessed in sitting, er/IR adducted  Active ROM Right eval Right 08/26/23  Shoulder flexion 69 76  Shoulder abduction 56 58  Shoulder internal rotation 90 90  Shoulder external rotation 19 44  Elbow flexion 115 104  Elbow extension 36 45  Wrist flexion 32 40  Wrist extension 28 58  Wrist ulnar deviation 40   Wrist radial deviation 10 22  Wrist pronation 90   Wrist supination 40 80  (Blank rows = not  tested)   UPPER EXTREMITY MMT:       Eval: Assessed on observation due to precautions limiting formal MMT   08/26/23: Assessed seated, er/IR adducted  MMT Right eval Right 08/26/23  Shoulder flexion 3-/5 3-/5  Shoulder abduction 3-/5 3-/5  Shoulder internal rotation 3/5 3+/5  Shoulder external rotation 3-/5 3/5  Elbow flexion 3-/5 3+/5  Elbow extension 3-/5 3+/5  Wrist flexion 3/5 4-/5  Wrist extension 3/5 4-/5  Wrist ulnar deviation 3/5 4-/5  Wrist radial deviation 3/5 4+/5  Wrist pronation 3/5 4-/5  Wrist supination 3-/5 4-/5  (Blank rows = not tested)  HAND FUNCTION: Grip strength: Right: 5 lbs; Left: 40 lbs 08/26/23: Right: 18 lbs  SENSATION: Tingling in fingers along ulnar nerve distribution  EDEMA:  Eval     Right:    Left:  Wrist     15.75cm  15.0cm Mid forearm(10cm from wrist)  16.5cm  16.0cm Elbow    27cm   24cm Mid upper arm (10cm from elbow) 30.5cm  27.5cm  08/26/23:      Right:      Wrist     15.25cm   Mid forearm(10cm from wrist)  16.5cm   Elbow    25.75cm    Mid upper arm (10cm from elbow) 28.5cm       OBSERVATIONS: moderate edema and fascial restrictions along RUE forearm and elbow, min edema in fingers and wrist   TREATMENT DATE:   09/15/23 -Retrograde massage and myofascial release to right forearm, volar elbow, upper arm, and trapezius regions to decrease edema and improve joint ROM -P/ROM: shoulder flexion, abduction; elbow flexion/extension 5 reps -A/ROM: supine-shoulder protraction, flexion, horizontal abduction, er, 12 reps -Proximal shoulder strengthening: supine-paddles, criss cross, circles each direction, 10 reps -A/ROM: sitting-bicep curl, hammer curl, 10 reps -A/ROM: sitting-shoulder protraction, flexion, abduction, 10 reps -Elbow flexion stretch: leaning onto forearms on countertop progressing elbow flexion as able to tolerate, 3x20" holds -Functional reaching: pt standing at countertop, placing 10 cones on middle shelf of overhead  cabinet in flexion, removing in flexion -Overhead lacing: seated, lacing from top down x6 links -Touching thumb to chin, 10 reps with minimal neck flexion  09/13/23 -Retrograde massage and myofascial release to right forearm, volar elbow, upper arm, and trapezius regions to decrease edema and improve joint ROM -P/ROM: shoulder flexion, abduction; elbow flexion/extension 5 reps -A/ROM: supine-shoulder protraction, flexion, horizontal abduction, er, 12 reps -Functional reaching: pt standing at countertop, placing 10 cones on bottom shelf of overhead cabinet in flexion, removing in abduction -A/ROM: sitting-bicep curl, hammer curl, 10 reps -A/ROM: sitting-shoulder protraction, flexion, 10 reps -Elbow flexion stretch: leaning onto forearms on countertop progressing elbow flexion as able to tolerate, 3x10" holds -Touching thumb to chin, 10 reps  09/09/23 -Retrograde massage and myofascial release to right  forearm, volar elbow, upper arm, and trapezius regions to decrease edema and improve joint ROM -P/ROM: shoulder flexion, abduction; elbow flexion/extension 5 reps -A/ROM: supine-shoulder protraction, flexion, horizontal abduction, er, 10 reps -AA/ROM: sitting-elbow flexion and extension, 12 reps  -A/ROM: sitting-shoulder protraction, flexion, er, abduction; elbow flexion/extension, 10 reps -Functional reaching: pinch tree-pt placing red and yellow clothespin along vertical bar of pinch tree, task focusing on shoulder flexion and protraction, as well as elbow extension -UBE: level 1, 2' forward     PATIENT EDUCATION: Education details: self feeding with right hand Person educated: Patient and Caregiver step-son Education method: Explanation, Demonstration, and Handouts Education comprehension: verbalized understanding and returned demonstration  HOME EXERCISE PROGRAM: Eval: elbow and wrist A/ROM 08/18/23: Table slides 09/01/23: AA/ROM shoulder and elbow 09/13/23: Touching thumb to chin or  nose 09/15/23: self-feeding with right hand   GOALS: Goals reviewed with patient? Yes  SHORT TERM GOALS: Target date: 09/09/23  Pt will be provided with and educated on HEP to improve mobility in RUE required for use during ADL completion.   Goal status: IN PROGRESS  2.  Pt will increase RUE A/ROM by 15+ degrees to improve ability to use RUE during dressing tasks with minimal compensatory techniques.   Goal status: IN PROGRESS  3.  Pt will increase RUE strength to 3+/5 to improve ability to reach for items at waist to chest height during bathing and grooming tasks.   Goal status: IN PROGRESS  4.  Pt will demonstrate decreased edema by at least 25% to improve ability to perform joint mobility tasks and improve functional use of the RUE during grooming tasks.    Goal status: IN PROGRESS    LONG TERM GOALS: Target date: 10/09/23  Pt will decrease pain in RUE to 3/10 or less to improve ability to perform HEP at recommended frequency and duration to improve mobility of RUE required for ADL completion.   Goal status: IN PROGRESS  2.  Pt will decrease RUE fascial restrictions to min amounts or less to improve mobility required for functional reaching tasks.   Goal status: IN PROGRESS  3.  Pt will increase RUE A/ROM by 30+ degrees to improve ability to use RUE when reaching overhead or behind back during dressing and bathing tasks.   Goal status: IN PROGRESS  4.  Pt will increase RUE strength to 4/5 or greater to improve ability to use RUE when lifting or carrying items during meal preparation/housework/yardwork tasks.   Goal status: IN PROGRESS   5.  Pt will increase RUE grip strength by 30# or greater to improve ability to use RUE when grasping and manipulating items for meal preparation or housework.   Goal status: IN PROGRESS    ASSESSMENT:  CLINICAL IMPRESSION: Pt reports she is doing her exercises, has no pain. She was able to touch her nose 1 time at home with her  right hand. Increased flexion stretch to 20" holds today. During functional reaching pt was able to reach the middle shelf of the cabinet instead of the bottom shelf. Today, pt was able to touch her finger to her nose with minimal compensatory neck flexion. Added overhead lacing with mod fatigue noted. Verbal cuing for form and technique, rest breaks provided as needed.     PERFORMANCE DEFICITS: in functional skills including ADLs, IADLs, coordination, dexterity, proprioception, sensation, edema, ROM, strength, pain, fascial restrictions, muscle spasms, flexibility, UE functional use, and vestibular     PLAN:  OT FREQUENCY: 2x/week  OT DURATION: 8 weeks  PLANNED INTERVENTIONS: 97168 OT Re-evaluation, 97535 self care/ADL training, 30865 therapeutic exercise, 97530 therapeutic activity, 97112 neuromuscular re-education, 97140 manual therapy, 97035 ultrasound, 97018 paraffin, 78469 electrical stimulation unattended, patient/family education, and DME and/or AE instructions  CONSULTED AND AGREED WITH PLAN OF CARE: Patient  PLAN FOR NEXT SESSION: Follow up on HEP, continue with manual techniques, P/ROM, AA/ROM, and A/ROM, touching nose or chin for progress towards eating with the RUE   Ezra Sites, OTR/L  [phone] 09/15/2023, 11:46 AM

## 2023-09-16 ENCOUNTER — Other Ambulatory Visit: Payer: Self-pay | Admitting: Family Medicine

## 2023-09-16 DIAGNOSIS — K219 Gastro-esophageal reflux disease without esophagitis: Secondary | ICD-10-CM

## 2023-09-19 ENCOUNTER — Other Ambulatory Visit: Payer: Self-pay

## 2023-09-19 ENCOUNTER — Encounter (HOSPITAL_COMMUNITY): Payer: Self-pay | Admitting: Occupational Therapy

## 2023-09-19 ENCOUNTER — Ambulatory Visit (HOSPITAL_COMMUNITY): Admitting: Occupational Therapy

## 2023-09-19 DIAGNOSIS — M25621 Stiffness of right elbow, not elsewhere classified: Secondary | ICD-10-CM

## 2023-09-19 DIAGNOSIS — I6523 Occlusion and stenosis of bilateral carotid arteries: Secondary | ICD-10-CM

## 2023-09-19 DIAGNOSIS — R29898 Other symptoms and signs involving the musculoskeletal system: Secondary | ICD-10-CM

## 2023-09-19 DIAGNOSIS — M25521 Pain in right elbow: Secondary | ICD-10-CM | POA: Diagnosis not present

## 2023-09-19 DIAGNOSIS — R278 Other lack of coordination: Secondary | ICD-10-CM | POA: Diagnosis not present

## 2023-09-19 NOTE — Therapy (Signed)
 OUTPATIENT OCCUPATIONAL THERAPY ORTHO TREATMENT   Patient Name: Caitlyn Boone MRN: [id number] DOB:Feb 04, 1941, 83 y.o., female Today's Date: 09/19/2023    END OF SESSION:  OT End of Session - 09/19/23 1059     Visit Number 13    Number of Visits 16    Date for OT Re-Evaluation 10/09/23    Authorization Type [employer] Medicare, $20 copay    Authorization Time Period 16 visits approved 08/10/23-10/05/23    Authorization - Visit Number 13    Authorization - Number of Visits 16    Progress Note Due on Visit 20    OT Start Time 1013    OT Stop Time 1058    OT Time Calculation (min) 45 min    Activity Tolerance Patient tolerated treatment well    Behavior During Therapy WFL for tasks assessed/performed                         Past Medical History:  Diagnosis Date   Compression fracture 05/24/2013   L1   GERD (gastroesophageal reflux disease)    Gout    Hyperlipidemia    Hypertension    Knee fracture, left feb 2015   no surgery done   Osteoporosis    Past Surgical History:  Procedure Laterality Date   ABDOMINAL HYSTERECTOMY  1992   complete   CHOLECYSTECTOMY N/A 06/28/2014   Procedure: LAPAROSCOPIC CHOLECYSTECTOMY ;  Surgeon: Boyce Byes, MD;  Location: [facility];  Service: General;  Laterality: N/A;   EYE SURGERY Bilateral 2013   lens for cataracts   LAMINECTOMY  1987   L5-S1   TONSILLECTOMY  1961   Patient Active Problem List   Diagnosis Date Noted   Anxiety 01/26/2023   Hypertriglyceridemia 08/18/2022   Elevated blood sugar 08/18/2022   Closed nondisplaced fracture of left patella 03/09/2018   Obesity (BMI 30.0-34.9) 02/16/2016   Gallstones 06/28/2014   Hyperlipidemia 02/19/2014   Osteoporosis 02/19/2014   Essential hypertension 02/19/2014   GERD (gastroesophageal reflux disease) 02/19/2014   Gout 02/19/2014   PCP: Dr. Melodie Spry Dettinger REFERRING PROVIDER: Wilfredo Hanly, PA-C  ONSET DATE: 07/21/23  REFERRING DIAG: s/p ORIF comminuted articular right  distal humerus fracture  THERAPY DIAG:  Pain in right elbow  Stiffness of right elbow, not elsewhere classified  Other lack of coordination  Other symptoms and signs involving the musculoskeletal system  Rationale for Evaluation and Treatment: Rehabilitation  SUBJECTIVE:   SUBJECTIVE STATEMENT: S: "I had some pain in my shoulder last night."  PERTINENT HISTORY: Pt is an 83 y/o female s/p ORIF comminuted articular right distal humerus fracture on 07/21/23. Pt sustained injury when her foot caught on the threshold between 2 rooms and she fell. Pt presents in posterior long arm splint and sling today. Step-son present as caregiver.   PRECAUTIONS: Other: Strictly NWB; ok to begin A/ROM at 2 weeks, wear splint until follow up with MD at 6 weeks; follow [place]  Handbook Protocol    WEIGHT BEARING RESTRICTIONS: Yes NWB  PAIN:  Are you having pain? No  FALLS: Has patient fallen in last 6 months? Yes. Number of falls 1  PLOF: Independent  PATIENT GOALS: To be able to use the RUE.   NEXT MD VISIT: unsure  OBJECTIVE:  Note: Objective measures were completed at Evaluation unless otherwise noted.  HAND DOMINANCE: Right  ADLs: Pt is unable to use the dominant RUE for any ADLs, cannot reach overhead or behind back. Pt with min difficulty sleeping.  FUNCTIONAL OUTCOME MEASURES: Upper Extremity Functional Scale (UEFS): 16/80  UPPER EXTREMITY ROM:       Eval: Assessed in sitting, unable to assess P/ROM due to protocol   08/26/23: Assessed in sitting, er/IR adducted  Active ROM Right eval Right 08/26/23  Shoulder flexion 69 76  Shoulder abduction 56 58  Shoulder internal rotation 90 90  Shoulder external rotation 19 44  Elbow flexion 115 104  Elbow extension 36 45  Wrist flexion 32 40  Wrist extension 28 58  Wrist ulnar deviation 40   Wrist radial deviation 10 22  Wrist pronation 90   Wrist supination 40 80  (Blank rows = not tested)   UPPER EXTREMITY MMT:        Eval: Assessed on observation due to precautions limiting formal MMT   08/26/23: Assessed seated, er/IR adducted  MMT Right eval Right 08/26/23  Shoulder flexion 3-/5 3-/5  Shoulder abduction 3-/5 3-/5  Shoulder internal rotation 3/5 3+/5  Shoulder external rotation 3-/5 3/5  Elbow flexion 3-/5 3+/5  Elbow extension 3-/5 3+/5  Wrist flexion 3/5 4-/5  Wrist extension 3/5 4-/5  Wrist ulnar deviation 3/5 4-/5  Wrist radial deviation 3/5 4+/5  Wrist pronation 3/5 4-/5  Wrist supination 3-/5 4-/5  (Blank rows = not tested)  HAND FUNCTION: Grip strength: Right: 5 lbs; Left: 40 lbs 08/26/23: Right: 18 lbs  SENSATION: Tingling in fingers along ulnar nerve distribution  EDEMA:  Eval     Right:    Left:  Wrist     15.75cm  15.0cm Mid forearm(10cm from wrist)  16.5cm  16.0cm Elbow    27cm   24cm Mid upper arm (10cm from elbow) 30.5cm  27.5cm  08/26/23:      Right:      Wrist     15.25cm   Mid forearm(10cm from wrist)  16.5cm   Elbow    25.75cm    Mid upper arm (10cm from elbow) 28.5cm       OBSERVATIONS: moderate edema and fascial restrictions along RUE forearm and elbow, min edema in fingers and wrist   TREATMENT DATE:  09/19/23 -Retrograde massage and myofascial release to right forearm, volar elbow, upper arm, and trapezius regions to decrease edema and improve joint ROM -P/ROM: shoulder flexion, abduction; elbow flexion/extension 5 reps -A/ROM: supine-shoulder protraction, flexion, horizontal abduction, er, 12 reps -Proximal shoulder strengthening: supine-paddles, criss cross, circles each direction, 10 reps -A/ROM: sitting-bicep curl, hammer curl, 10 reps -A/ROM: sitting-shoulder protraction, flexion, abduction, er, horizontal abduction 10 reps -Touching thumb to chin, 12 reps with minimal neck flexion -Elbow flexion stretch: leaning onto forearms on countertop progressing elbow flexion as able to tolerate, 3x20" holds -Wall wash: 1' flexion -UBE: level 1, 2'  forward, 2' reverse, pace: 4.5  09/15/23 -Retrograde massage and myofascial release to right forearm, volar elbow, upper arm, and trapezius regions to decrease edema and improve joint ROM -P/ROM: shoulder flexion, abduction; elbow flexion/extension 5 reps -A/ROM: supine-shoulder protraction, flexion, horizontal abduction, er, 12 reps -Proximal shoulder strengthening: supine-paddles, criss cross, circles each direction, 10 reps -A/ROM: sitting-bicep curl, hammer curl, 10 reps -A/ROM: sitting-shoulder protraction, flexion, abduction, 10 reps -Elbow flexion stretch: leaning onto forearms on countertop progressing elbow flexion as able to tolerate, 3x20" holds -Functional reaching: pt standing at countertop, placing 10 cones on middle shelf of overhead cabinet in flexion, removing in flexion -Overhead lacing: seated, lacing from top down x6 links -Touching thumb to chin, 10 reps with minimal neck flexion  09/13/23 -Retrograde massage and myofascial  release to right forearm, volar elbow, upper arm, and trapezius regions to decrease edema and improve joint ROM -P/ROM: shoulder flexion, abduction; elbow flexion/extension 5 reps -A/ROM: supine-shoulder protraction, flexion, horizontal abduction, er, 12 reps -Functional reaching: pt standing at countertop, placing 10 cones on bottom shelf of overhead cabinet in flexion, removing in abduction -A/ROM: sitting-bicep curl, hammer curl, 10 reps -A/ROM: sitting-shoulder protraction, flexion, 10 reps -Elbow flexion stretch: leaning onto forearms on countertop progressing elbow flexion as able to tolerate, 3x10" holds -Touching thumb to chin, 10 reps     PATIENT EDUCATION: Education details: reviewed HEP Person educated: Patient and Caregiver step-son Education method: Explanation, Demonstration, and Handouts Education comprehension: verbalized understanding and returned demonstration  HOME EXERCISE PROGRAM: Eval: elbow and wrist A/ROM 08/18/23: Table  slides 09/01/23: AA/ROM shoulder and elbow 09/13/23: Touching thumb to chin or nose 09/15/23: self-feeding with right hand   GOALS: Goals reviewed with patient? Yes  SHORT TERM GOALS: Target date: 09/09/23  Pt will be provided with and educated on HEP to improve mobility in RUE required for use during ADL completion.   Goal status: IN PROGRESS  2.  Pt will increase RUE A/ROM by 15+ degrees to improve ability to use RUE during dressing tasks with minimal compensatory techniques.   Goal status: IN PROGRESS  3.  Pt will increase RUE strength to 3+/5 to improve ability to reach for items at waist to chest height during bathing and grooming tasks.   Goal status: IN PROGRESS  4.  Pt will demonstrate decreased edema by at least 25% to improve ability to perform joint mobility tasks and improve functional use of the RUE during grooming tasks.    Goal status: IN PROGRESS    LONG TERM GOALS: Target date: 10/09/23  Pt will decrease pain in RUE to 3/10 or less to improve ability to perform HEP at recommended frequency and duration to improve mobility of RUE required for ADL completion.   Goal status: IN PROGRESS  2.  Pt will decrease RUE fascial restrictions to min amounts or less to improve mobility required for functional reaching tasks.   Goal status: IN PROGRESS  3.  Pt will increase RUE A/ROM by 30+ degrees to improve ability to use RUE when reaching overhead or behind back during dressing and bathing tasks.   Goal status: IN PROGRESS  4.  Pt will increase RUE strength to 4/5 or greater to improve ability to use RUE when lifting or carrying items during meal preparation/housework/yardwork tasks.   Goal status: IN PROGRESS   5.  Pt will increase RUE grip strength by 30# or greater to improve ability to use RUE when grasping and manipulating items for meal preparation or housework.   Goal status: IN PROGRESS    ASSESSMENT:  CLINICAL IMPRESSION: Pt reports she had some pain  last night, OT notes significant line in upper arm where elastic sleeve ends. Instructed pt to remove sleeve unless she is wearing her splint. Pt continues to improve with shoulder strength and elbow flexion. Able to feed herself breakfast on Saturday using the right hand and was able to touch both nose and chin today with minimal neck compensation. Added wall wash with focus on elbow and shoulder flexion, resumed UBE. Verbal cuing for form and technique throughout tasks.     PERFORMANCE DEFICITS: in functional skills including ADLs, IADLs, coordination, dexterity, proprioception, sensation, edema, ROM, strength, pain, fascial restrictions, muscle spasms, flexibility, UE functional use, and vestibular     PLAN:  OT FREQUENCY:  2x/week  OT DURATION: 8 weeks  PLANNED INTERVENTIONS: 97168 OT Re-evaluation, 97535 self care/ADL training, 78295 therapeutic exercise, 97530 therapeutic activity, 97112 neuromuscular re-education, 97140 manual therapy, 97035 ultrasound, 97018 paraffin, 62130 electrical stimulation unattended, patient/family education, and DME and/or AE instructions  CONSULTED AND AGREED WITH PLAN OF CARE: Patient  PLAN FOR NEXT SESSION: Follow up on HEP, continue with manual techniques, P/ROM, AA/ROM, and A/ROM, touching nose or chin for progress towards eating with the RUE; update HEP for elbow A/ROM   Lafonda Piety, OTR/L  [phone] 09/19/2023, 10:59 AM

## 2023-09-22 ENCOUNTER — Encounter (HOSPITAL_COMMUNITY): Payer: Self-pay | Admitting: Occupational Therapy

## 2023-09-22 ENCOUNTER — Ambulatory Visit (HOSPITAL_COMMUNITY): Admitting: Occupational Therapy

## 2023-09-22 DIAGNOSIS — M25521 Pain in right elbow: Secondary | ICD-10-CM | POA: Diagnosis not present

## 2023-09-22 DIAGNOSIS — M25621 Stiffness of right elbow, not elsewhere classified: Secondary | ICD-10-CM | POA: Diagnosis not present

## 2023-09-22 DIAGNOSIS — R29898 Other symptoms and signs involving the musculoskeletal system: Secondary | ICD-10-CM

## 2023-09-22 DIAGNOSIS — R278 Other lack of coordination: Secondary | ICD-10-CM

## 2023-09-22 NOTE — Therapy (Signed)
 OUTPATIENT OCCUPATIONAL THERAPY ORTHO TREATMENT   Patient Name: Caitlyn Boone MRN: [id number] DOB:[birth year], 83 y.o., female Today's Date: 09/22/2023    END OF SESSION:  OT End of Session - 09/22/23 1231     Visit Number 14    Number of Visits 16    Date for OT Re-Evaluation 10/09/23    Authorization Type [employer] Medicare, $20 copay    Authorization Time Period 16 visits approved 08/10/23-10/05/23    Authorization - Visit Number 14    Authorization - Number of Visits 16    Progress Note Due on Visit 20    OT Start Time 1145    OT Stop Time 1227    OT Time Calculation (min) 42 min    Activity Tolerance Patient tolerated treatment well    Behavior During Therapy WFL for tasks assessed/performed                          Past Medical History:  Diagnosis Date   Compression fracture 05/24/2013   L1   GERD (gastroesophageal reflux disease)    Gout    Hyperlipidemia    Hypertension    Knee fracture, left feb 2015   no surgery done   Osteoporosis    Past Surgical History:  Procedure Laterality Date   ABDOMINAL HYSTERECTOMY  1992   complete   CHOLECYSTECTOMY N/A 06/28/2014   Procedure: LAPAROSCOPIC CHOLECYSTECTOMY ;  Surgeon: Claud Kelp, MD;  Location: [facility];  Service: General;  Laterality: N/A;   EYE SURGERY Bilateral 2013   lens for cataracts   LAMINECTOMY  1987   L5-S1   TONSILLECTOMY  1961   Patient Active Problem List   Diagnosis Date Noted   Anxiety 01/26/2023   Hypertriglyceridemia 08/18/2022   Elevated blood sugar 08/18/2022   Closed nondisplaced fracture of left patella 03/09/2018   Obesity (BMI 30.0-34.9) 02/16/2016   Gallstones 06/28/2014   Hyperlipidemia 02/19/2014   Osteoporosis 02/19/2014   Essential hypertension 02/19/2014   GERD (gastroesophageal reflux disease) 02/19/2014   Gout 02/19/2014   PCP: Dr. Ivin Booty Dettinger REFERRING PROVIDER: Malachy Mood, PA-C  ONSET DATE: 07/21/23  REFERRING DIAG: s/p ORIF comminuted articular  right distal humerus fracture  THERAPY DIAG:  Pain in right elbow  Stiffness of right elbow, not elsewhere classified  Other lack of coordination  Other symptoms and signs involving the musculoskeletal system  Rationale for Evaluation and Treatment: Rehabilitation  SUBJECTIVE:   SUBJECTIVE STATEMENT: S: "The skin around my elbow is really sensitive."   PERTINENT HISTORY: Pt is an 83 y/o female s/p ORIF comminuted articular right distal humerus fracture on 07/21/23. Pt sustained injury when her foot caught on the threshold between 2 rooms and she fell. Pt presents in posterior long arm splint and sling today. Step-son present as caregiver.   PRECAUTIONS: Other: Strictly NWB; ok to begin A/ROM at 2 weeks, wear splint until follow up with MD at 6 weeks; follow [place] Handbook Protocol    WEIGHT BEARING RESTRICTIONS: Yes NWB  PAIN:  Are you having pain? No  FALLS: Has patient fallen in last 6 months? Yes. Number of falls 1  PLOF: Independent  PATIENT GOALS: To be able to use the RUE.   NEXT MD VISIT: unsure  OBJECTIVE:  Note: Objective measures were completed at Evaluation unless otherwise noted.  HAND DOMINANCE: Right  ADLs: Pt is unable to use the dominant RUE for any ADLs, cannot reach overhead or behind back. Pt with min difficulty sleeping.  FUNCTIONAL OUTCOME MEASURES: Upper Extremity Functional Scale (UEFS): 16/80  UPPER EXTREMITY ROM:       Eval: Assessed in sitting, unable to assess P/ROM due to protocol   08/26/23: Assessed in sitting, er/IR adducted  Active ROM Right eval Right 08/26/23  Shoulder flexion 69 76  Shoulder abduction 56 58  Shoulder internal rotation 90 90  Shoulder external rotation 19 44  Elbow flexion 115 104  Elbow extension 36 45  Wrist flexion 32 40  Wrist extension 28 58  Wrist ulnar deviation 40   Wrist radial deviation 10 22  Wrist pronation 90   Wrist supination 40 80  (Blank rows = not tested)   UPPER EXTREMITY MMT:        Eval: Assessed on observation due to precautions limiting formal MMT   08/26/23: Assessed seated, er/IR adducted  MMT Right eval Right 08/26/23  Shoulder flexion 3-/5 3-/5  Shoulder abduction 3-/5 3-/5  Shoulder internal rotation 3/5 3+/5  Shoulder external rotation 3-/5 3/5  Elbow flexion 3-/5 3+/5  Elbow extension 3-/5 3+/5  Wrist flexion 3/5 4-/5  Wrist extension 3/5 4-/5  Wrist ulnar deviation 3/5 4-/5  Wrist radial deviation 3/5 4+/5  Wrist pronation 3/5 4-/5  Wrist supination 3-/5 4-/5  (Blank rows = not tested)  HAND FUNCTION: Grip strength: Right: 5 lbs; Left: 40 lbs 08/26/23: Right: 18 lbs  SENSATION: Tingling in fingers along ulnar nerve distribution  EDEMA:  Eval     Right:    Left:  Wrist     15.75cm  15.0cm Mid forearm(10cm from wrist)  16.5cm  16.0cm Elbow    27cm   24cm Mid upper arm (10cm from elbow) 30.5cm  27.5cm  08/26/23:      Right:      Wrist     15.25cm   Mid forearm(10cm from wrist)  16.5cm   Elbow    25.75cm    Mid upper arm (10cm from elbow) 28.5cm       OBSERVATIONS: moderate edema and fascial restrictions along RUE forearm and elbow, min edema in fingers and wrist   TREATMENT DATE:  09/22/23 -Retrograde massage and myofascial release to right forearm, volar elbow, upper arm, and trapezius regions to decrease edema and improve joint ROM -P/ROM: shoulder flexion, abduction; elbow flexion/extension 5 reps -A/ROM: supine-shoulder protraction, flexion, horizontal abduction, er, abduction 12 reps -Proximal shoulder strengthening: supine-paddles, criss cross, circles each direction, 10 reps -A/ROM: sitting-bicep curl, hammer curl, 10 reps -Elbow flexion stretch: leaning onto forearms on countertop progressing elbow flexion as able to tolerate, 3x20" holds -Hand gripper task: large and medium beads gripper vertical at 20#, incorporating elbow flexion into task -Wall wash: 1' flexion -UBE: level 1, 2' forward, 2' reverse, pace:  4.5  09/19/23 -Retrograde massage and myofascial release to right forearm, volar elbow, upper arm, and trapezius regions to decrease edema and improve joint ROM -P/ROM: shoulder flexion, abduction; elbow flexion/extension 5 reps -A/ROM: supine-shoulder protraction, flexion, horizontal abduction, er, 12 reps -Proximal shoulder strengthening: supine-paddles, criss cross, circles each direction, 10 reps -A/ROM: sitting-bicep curl, hammer curl, 10 reps -A/ROM: sitting-shoulder protraction, flexion, abduction, er, horizontal abduction 10 reps -Touching thumb to chin, 12 reps with minimal neck flexion -Elbow flexion stretch: leaning onto forearms on countertop progressing elbow flexion as able to tolerate, 3x20" holds -Wall wash: 1' flexion -UBE: level 1, 2' forward, 2' reverse, pace: 4.5  09/15/23 -Retrograde massage and myofascial release to right forearm, volar elbow, upper arm, and trapezius regions to decrease edema and improve  joint ROM -P/ROM: shoulder flexion, abduction; elbow flexion/extension 5 reps -A/ROM: supine-shoulder protraction, flexion, horizontal abduction, er, 12 reps -Proximal shoulder strengthening: supine-paddles, criss cross, circles each direction, 10 reps -A/ROM: sitting-bicep curl, hammer curl, 10 reps -A/ROM: sitting-shoulder protraction, flexion, abduction, 10 reps -Elbow flexion stretch: leaning onto forearms on countertop progressing elbow flexion as able to tolerate, 3x20" holds -Functional reaching: pt standing at countertop, placing 10 cones on middle shelf of overhead cabinet in flexion, removing in flexion -Overhead lacing: seated, lacing from top down x6 links -Touching thumb to chin, 10 reps with minimal neck flexion     PATIENT EDUCATION: Education details: reviewed HEP Person educated: Patient and Caregiver step-son Education method: Explanation, Demonstration, and Handouts Education comprehension: verbalized understanding and returned  demonstration  HOME EXERCISE PROGRAM: Eval: elbow and wrist A/ROM 08/18/23: Table slides 09/01/23: AA/ROM shoulder and elbow 09/13/23: Touching thumb to chin or nose 09/15/23: self-feeding with right hand   GOALS: Goals reviewed with patient? Yes  SHORT TERM GOALS: Target date: 09/09/23  Pt will be provided with and educated on HEP to improve mobility in RUE required for use during ADL completion.   Goal status: IN PROGRESS  2.  Pt will increase RUE A/ROM by 15+ degrees to improve ability to use RUE during dressing tasks with minimal compensatory techniques.   Goal status: IN PROGRESS  3.  Pt will increase RUE strength to 3+/5 to improve ability to reach for items at waist to chest height during bathing and grooming tasks.   Goal status: IN PROGRESS  4.  Pt will demonstrate decreased edema by at least 25% to improve ability to perform joint mobility tasks and improve functional use of the RUE during grooming tasks.    Goal status: IN PROGRESS    LONG TERM GOALS: Target date: 10/09/23  Pt will decrease pain in RUE to 3/10 or less to improve ability to perform HEP at recommended frequency and duration to improve mobility of RUE required for ADL completion.   Goal status: IN PROGRESS  2.  Pt will decrease RUE fascial restrictions to min amounts or less to improve mobility required for functional reaching tasks.   Goal status: IN PROGRESS  3.  Pt will increase RUE A/ROM by 30+ degrees to improve ability to use RUE when reaching overhead or behind back during dressing and bathing tasks.   Goal status: IN PROGRESS  4.  Pt will increase RUE strength to 4/5 or greater to improve ability to use RUE when lifting or carrying items during meal preparation/housework/yardwork tasks.   Goal status: IN PROGRESS   5.  Pt will increase RUE grip strength by 30# or greater to improve ability to use RUE when grasping and manipulating items for meal preparation or housework.   Goal status: IN  PROGRESS    ASSESSMENT:  CLINICAL IMPRESSION: Pt reports she had some pain at her elbow where the skin is sensitive when she removed the Band-Aid. Continued with manual techniques, pt with minimal fascial restrictions today. A/ROM completed in supine and standing, pt with continued improvements in elbow flexion and ability to reach her mouth. Added grip strengthening and incorporated elbow ROM, mod to max difficulty with technique required for success.  Verbal cuing for form and technique, mod fatigue with rest breaks provided as needed.     PERFORMANCE DEFICITS: in functional skills including ADLs, IADLs, coordination, dexterity, proprioception, sensation, edema, ROM, strength, pain, fascial restrictions, muscle spasms, flexibility, UE functional use, and vestibular     PLAN:  OT FREQUENCY: 2x/week  OT DURATION: 8 weeks  PLANNED INTERVENTIONS: 97168 OT Re-evaluation, 97535 self care/ADL training, 19147 therapeutic exercise, 97530 therapeutic activity, 97112 neuromuscular re-education, 97140 manual therapy, 97035 ultrasound, 97018 paraffin, 82956 electrical stimulation unattended, patient/family education, and DME and/or AE instructions  CONSULTED AND AGREED WITH PLAN OF CARE: Patient  PLAN FOR NEXT SESSION: Follow up on HEP, continue with manual techniques, P/ROM, AA/ROM, and A/ROM, touching nose or chin for progress towards eating with the RUE; update HEP for elbow A/ROM   Lafonda Piety, OTR/L  [phone] 09/22/2023, 12:32 PM

## 2023-09-27 ENCOUNTER — Encounter (HOSPITAL_COMMUNITY): Payer: Self-pay | Admitting: Occupational Therapy

## 2023-09-27 ENCOUNTER — Ambulatory Visit (HOSPITAL_COMMUNITY): Admitting: Occupational Therapy

## 2023-09-27 DIAGNOSIS — R278 Other lack of coordination: Secondary | ICD-10-CM

## 2023-09-27 DIAGNOSIS — M25521 Pain in right elbow: Secondary | ICD-10-CM | POA: Diagnosis not present

## 2023-09-27 DIAGNOSIS — R29898 Other symptoms and signs involving the musculoskeletal system: Secondary | ICD-10-CM | POA: Diagnosis not present

## 2023-09-27 DIAGNOSIS — M25621 Stiffness of right elbow, not elsewhere classified: Secondary | ICD-10-CM

## 2023-09-27 NOTE — Therapy (Signed)
 OUTPATIENT OCCUPATIONAL THERAPY ORTHO TREATMENT   Patient Name: Caitlyn Boone MRN: [id number] DOB:1940-11-29, 83 y.o., female Today's Date: 09/27/2023    END OF SESSION:  OT End of Session - 09/27/23 1149     Visit Number 15    Number of Visits 19    Date for OT Re-Evaluation 10/09/23    Authorization Type [employer] Medicare, $20 copay    Authorization Time Period 16 visits approved 08/10/23-10/05/23    Authorization - Visit Number 15    Authorization - Number of Visits 16    Progress Note Due on Visit 20    OT Start Time 1101    OT Stop Time 1142    OT Time Calculation (min) 41 min    Activity Tolerance Patient tolerated treatment well    Behavior During Therapy WFL for tasks assessed/performed                           Past Medical History:  Diagnosis Date   Compression fracture 05/24/2013   L1   GERD (gastroesophageal reflux disease)    Gout    Hyperlipidemia    Hypertension    Knee fracture, left feb 2015   no surgery done   Osteoporosis    Past Surgical History:  Procedure Laterality Date   ABDOMINAL HYSTERECTOMY  1992   complete   CHOLECYSTECTOMY N/A 06/28/2014   Procedure: LAPAROSCOPIC CHOLECYSTECTOMY ;  Surgeon: Boyce Byes, MD;  Location: [facility];  Service: General;  Laterality: N/A;   EYE SURGERY Bilateral 2013   lens for cataracts   LAMINECTOMY  1987   L5-S1   TONSILLECTOMY  1961   Patient Active Problem List   Diagnosis Date Noted   Anxiety 01/26/2023   Hypertriglyceridemia 08/18/2022   Elevated blood sugar 08/18/2022   Closed nondisplaced fracture of left patella 03/09/2018   Obesity (BMI 30.0-34.9) 02/16/2016   Gallstones 06/28/2014   Hyperlipidemia 02/19/2014   Osteoporosis 02/19/2014   Essential hypertension 02/19/2014   GERD (gastroesophageal reflux disease) 02/19/2014   Gout 02/19/2014   PCP: Dr. Melodie Spry Dettinger REFERRING PROVIDER: Wilfredo Hanly, PA-C  ONSET DATE: 07/21/23  REFERRING DIAG: s/p ORIF comminuted articular  right distal humerus fracture  THERAPY DIAG:  Pain in right elbow  Stiffness of right elbow, not elsewhere classified  Other lack of coordination  Other symptoms and signs involving the musculoskeletal system  Rationale for Evaluation and Treatment: Rehabilitation  SUBJECTIVE:   SUBJECTIVE STATEMENT: S: "I bumped my elbow this morning."   PERTINENT HISTORY: Pt is an 83 y/o female s/p ORIF comminuted articular right distal humerus fracture on 07/21/23. Pt sustained injury when her foot caught on the threshold between 2 rooms and she fell. Pt presents in posterior long arm splint and sling today. Step-son present as caregiver.   PRECAUTIONS: Other: Strictly NWB; ok to begin A/ROM at 2 weeks, wear splint until follow up with MD at 6 weeks; follow [place]  Handbook Protocol    WEIGHT BEARING RESTRICTIONS: Yes NWB  PAIN:  Are you having pain? No  FALLS: Has patient fallen in last 6 months? Yes. Number of falls 1  PLOF: Independent  PATIENT GOALS: To be able to use the RUE.   NEXT MD VISIT: unsure  OBJECTIVE:  Note: Objective measures were completed at Evaluation unless otherwise noted.  HAND DOMINANCE: Right  ADLs: Pt is unable to use the dominant RUE for any ADLs, cannot reach overhead or behind back. Pt with min difficulty sleeping.  FUNCTIONAL OUTCOME MEASURES: Upper Extremity Functional Scale (UEFS): 16/80  UPPER EXTREMITY ROM:       Eval: Assessed in sitting, unable to assess P/ROM due to protocol   08/26/23: Assessed in sitting, er/IR adducted   09/27/23: Assessed in sitting, er/IR adducted  Active ROM Right eval Right 08/26/23 Right 09/27/23  Shoulder flexion 69 76 111  Shoulder abduction 56 58 160  Shoulder internal rotation 90 90 90  Shoulder external rotation 19 44 45  Elbow flexion 115 104 114  Elbow extension 36 45 29  Wrist flexion 32 40 52  Wrist extension 28 58 58  Wrist ulnar deviation 40    Wrist radial deviation 10 22   Wrist pronation 90     Wrist supination 40 80 80  (Blank rows = not tested)   UPPER EXTREMITY MMT:       Eval: Assessed on observation due to precautions limiting formal MMT   08/26/23: Assessed seated, er/IR adducted   09/27/23: Assessed seated, er/IR adducted  MMT Right eval Right 08/26/23 Right 09/27/23  Shoulder flexion 3-/5 3-/5 3/5  Shoulder abduction 3-/5 3-/5 3/5  Shoulder internal rotation 3/5 3+/5 4/5  Shoulder external rotation 3-/5 3/5 3/5  Elbow flexion 3-/5 3+/5 4+/5  Elbow extension 3-/5 3+/5 4/5  Wrist flexion 3/5 4-/5 5/5  Wrist extension 3/5 4-/5 5/5  Wrist ulnar deviation 3/5 4-/5 4/5  Wrist radial deviation 3/5 4+/5 4+/5  Wrist pronation 3/5 4-/5 4+/5  Wrist supination 3-/5 4-/5 4/5  (Blank rows = not tested)  HAND FUNCTION: Grip strength: Right: 5 lbs; Left: 40 lbs 08/26/23: Right: 18 lbs 09/27/23: Right: 25  SENSATION: Tingling in fingers along ulnar nerve distribution  EDEMA:  Eval     Right:    Left:  Wrist     15.75cm  15.0cm Mid forearm(10cm from wrist)  16.5cm  16.0cm Elbow    27cm   24cm Mid upper arm (10cm from elbow) 30.5cm  27.5cm  08/26/23:      Right:      Wrist     15.25cm   Mid forearm(10cm from wrist)  16.5cm   Elbow    25.75cm    Mid upper arm (10cm from elbow) 28.5cm       OBSERVATIONS: moderate edema and fascial restrictions along RUE forearm and elbow, min edema in fingers and wrist   TREATMENT DATE:  09/27/23 -Retrograde massage and myofascial release to right forearm, volar elbow, upper arm, and trapezius regions to decrease edema and improve joint ROM -P/ROM: shoulder flexion, abduction; elbow flexion/extension 5 reps -A/ROM: supine-shoulder protraction, flexion, horizontal abduction, er, abduction 12 reps -Hand gripper task: large and medium beads gripper vertical at 20#, incorporating elbow flexion into task -A/ROM: sitting-bicep curl, hammer curl, 10 reps  09/22/23 -Retrograde massage and myofascial release to right forearm, volar elbow,  upper arm, and trapezius regions to decrease edema and improve joint ROM -P/ROM: shoulder flexion, abduction; elbow flexion/extension 5 reps -A/ROM: supine-shoulder protraction, flexion, horizontal abduction, er, abduction 12 reps -Proximal shoulder strengthening: supine-paddles, criss cross, circles each direction, 10 reps -A/ROM: sitting-bicep curl, hammer curl, 10 reps -Elbow flexion stretch: leaning onto forearms on countertop progressing elbow flexion as able to tolerate, 3x20" holds -Hand gripper task: large and medium beads gripper vertical at 20#, incorporating elbow flexion into task -Wall wash: 1' flexion -UBE: level 1, 2' forward, 2' reverse, pace: 4.5  09/19/23 -Retrograde massage and myofascial release to right forearm, volar elbow, upper arm, and trapezius regions to decrease edema  and improve joint ROM -P/ROM: shoulder flexion, abduction; elbow flexion/extension 5 reps -A/ROM: supine-shoulder protraction, flexion, horizontal abduction, er, 12 reps -Proximal shoulder strengthening: supine-paddles, criss cross, circles each direction, 10 reps -A/ROM: sitting-bicep curl, hammer curl, 10 reps -A/ROM: sitting-shoulder protraction, flexion, abduction, er, horizontal abduction 10 reps -Touching thumb to chin, 12 reps with minimal neck flexion -Elbow flexion stretch: leaning onto forearms on countertop progressing elbow flexion as able to tolerate, 3x20" holds -Wall wash: 1' flexion -UBE: level 1, 2' forward, 2' reverse, pace: 4.5     PATIENT EDUCATION: Education details: reviewed HEP Person educated: Patient and Caregiver step-son Education method: Explanation, Demonstration, and Handouts Education comprehension: verbalized understanding and returned demonstration  HOME EXERCISE PROGRAM: Eval: elbow and wrist A/ROM 08/18/23: Table slides 09/01/23: AA/ROM shoulder and elbow 09/13/23: Touching thumb to chin or nose 09/15/23: self-feeding with right hand 09/27/23: reaching into  cabinets at home   GOALS: Goals reviewed with patient? Yes  SHORT TERM GOALS: Target date: 09/09/23  Pt will be provided with and educated on HEP to improve mobility in RUE required for use during ADL completion.   Goal status: IN PROGRESS  2.  Pt will increase RUE A/ROM by 15+ degrees to improve ability to use RUE during dressing tasks with minimal compensatory techniques.   Goal status: MET  3.  Pt will increase RUE strength to 3+/5 to improve ability to reach for items at waist to chest height during bathing and grooming tasks.   Goal status: IN PROGRESS  4.  Pt will demonstrate decreased edema by at least 25% to improve ability to perform joint mobility tasks and improve functional use of the RUE during grooming tasks.    Goal status: MET    LONG TERM GOALS: Target date: 10/09/23  Pt will decrease pain in RUE to 3/10 or less to improve ability to perform HEP at recommended frequency and duration to improve mobility of RUE required for ADL completion.   Goal status: MET  2.  Pt will decrease RUE fascial restrictions to min amounts or less to improve mobility required for functional reaching tasks.   Goal status: IN PROGRESS  3.  Pt will increase RUE A/ROM by 30+ degrees to improve ability to use RUE when reaching overhead or behind back during dressing and bathing tasks.   Goal status: IN PROGRESS  4.  Pt will increase RUE strength to 4/5 or greater to improve ability to use RUE when lifting or carrying items during meal preparation/housework/yardwork tasks.   Goal status: IN PROGRESS   5.  Pt will increase RUE grip strength by 30# or greater to improve ability to use RUE when grasping and manipulating items for meal preparation or housework.   Goal status: IN PROGRESS    ASSESSMENT:  CLINICAL IMPRESSION: Pt reports she is using her right arm as much as she can for ADLs and household tasks. She was able to sweep with it and has been eating all of her breakfast  with the RUE. Progress note completed today, pt with improvement in all ROM and strength measurements today in both the shoulder and the elbow. Pt continues to have ROM and strength deficits limiting consistent use of the RUE during the day and performing tasks requiring functional reaching such as washing and fixing hair, reaching behind her back, and reaching into overhead cabinets. Encouraged pt to begin reaching into cabinets at home. Pt completing A/ROM for shoulder and elbow today, continued with grip activity incorporating elbow extension during reaching. Verbal  cuing for form and technique.     PERFORMANCE DEFICITS: in functional skills including ADLs, IADLs, coordination, dexterity, proprioception, sensation, edema, ROM, strength, pain, fascial restrictions, muscle spasms, flexibility, UE functional use, and vestibular     PLAN:  OT FREQUENCY: 2x/week  OT DURATION: 8 weeks  PLANNED INTERVENTIONS: 97168 OT Re-evaluation, 97535 self care/ADL training, 62952 therapeutic exercise, 97530 therapeutic activity, 97112 neuromuscular re-education, 97140 manual therapy, 97035 ultrasound, 97018 paraffin, 84132 electrical stimulation unattended, patient/family education, and DME and/or AE instructions  CONSULTED AND AGREED WITH PLAN OF CARE: Patient  PLAN FOR NEXT SESSION: Follow up on HEP, continue with manual techniques, P/ROM, A/ROM, touching nose or chin for progress towards eating with the RUE; REQUEST ADDL VISITS FROM INSURANCE   Lafonda Piety, OTR/L  [phone] 09/27/2023, 11:50 AM

## 2023-09-28 ENCOUNTER — Other Ambulatory Visit: Payer: Self-pay | Admitting: Family Medicine

## 2023-09-28 DIAGNOSIS — I10 Essential (primary) hypertension: Secondary | ICD-10-CM

## 2023-09-28 DIAGNOSIS — F419 Anxiety disorder, unspecified: Secondary | ICD-10-CM

## 2023-09-29 ENCOUNTER — Ambulatory Visit (HOSPITAL_COMMUNITY): Admitting: Occupational Therapy

## 2023-09-29 ENCOUNTER — Encounter (HOSPITAL_COMMUNITY): Payer: Self-pay | Admitting: Occupational Therapy

## 2023-09-29 DIAGNOSIS — R29898 Other symptoms and signs involving the musculoskeletal system: Secondary | ICD-10-CM

## 2023-09-29 DIAGNOSIS — M25521 Pain in right elbow: Secondary | ICD-10-CM

## 2023-09-29 DIAGNOSIS — M25621 Stiffness of right elbow, not elsewhere classified: Secondary | ICD-10-CM

## 2023-09-29 DIAGNOSIS — R278 Other lack of coordination: Secondary | ICD-10-CM

## 2023-09-29 NOTE — Therapy (Signed)
 OUTPATIENT OCCUPATIONAL THERAPY ORTHO TREATMENT   Patient Name: Caitlyn Boone MRN: [id number] DOB:01/29/1941, 83 y.o., female Today's Date: 09/29/2023    END OF SESSION:  OT End of Session - 09/29/23 1206     Visit Number 16    Number of Visits 19    Date for OT Re-Evaluation 10/09/23    Authorization Type [employer] Medicare, $20 copay    Authorization Time Period 16 visits approved 08/10/23-10/05/23; requesting additional visits    Authorization - Visit Number 16    Authorization - Number of Visits 16    Progress Note Due on Visit 20    OT Start Time 1106    OT Stop Time 1144    OT Time Calculation (min) 38 min    Activity Tolerance Patient tolerated treatment well    Behavior During Therapy WFL for tasks assessed/performed                            Past Medical History:  Diagnosis Date   Compression fracture 05/24/2013   L1   GERD (gastroesophageal reflux disease)    Gout    Hyperlipidemia    Hypertension    Knee fracture, left feb 2015   no surgery done   Osteoporosis    Past Surgical History:  Procedure Laterality Date   ABDOMINAL HYSTERECTOMY  1992   complete   CHOLECYSTECTOMY N/A 06/28/2014   Procedure: LAPAROSCOPIC CHOLECYSTECTOMY ;  Surgeon: Boyce Byes, MD;  Location: [facility];  Service: General;  Laterality: N/A;   EYE SURGERY Bilateral 2013   lens for cataracts   LAMINECTOMY  1987   L5-S1   TONSILLECTOMY  1961   Patient Active Problem List   Diagnosis Date Noted   Anxiety 01/26/2023   Hypertriglyceridemia 08/18/2022   Elevated blood sugar 08/18/2022   Closed nondisplaced fracture of left patella 03/09/2018   Obesity (BMI 30.0-34.9) 02/16/2016   Gallstones 06/28/2014   Hyperlipidemia 02/19/2014   Osteoporosis 02/19/2014   Essential hypertension 02/19/2014   GERD (gastroesophageal reflux disease) 02/19/2014   Gout 02/19/2014   PCP: Dr. Melodie Spry Dettinger REFERRING PROVIDER: Wilfredo Hanly, PA-C  ONSET DATE: 07/21/23  REFERRING DIAG:  s/p ORIF comminuted articular right distal humerus fracture  THERAPY DIAG:  Pain in right elbow  Stiffness of right elbow, not elsewhere classified  Other symptoms and signs involving the musculoskeletal system  Other lack of coordination  Rationale for Evaluation and Treatment: Rehabilitation  SUBJECTIVE:   SUBJECTIVE STATEMENT: S: "I had a bandaid on my elbow but I took it off."   PERTINENT HISTORY: Pt is an 83 y/o female s/p ORIF comminuted articular right distal humerus fracture on 07/21/23. Pt sustained injury when her foot caught on the threshold between 2 rooms and she fell. Pt presents in posterior long arm splint and sling today. Step-son present as caregiver.   PRECAUTIONS: Other: Strictly NWB; ok to begin A/ROM at 2 weeks, wear splint until follow up with MD at 6 weeks; follow [place]  Handbook Protocol    WEIGHT BEARING RESTRICTIONS: Yes NWB  PAIN:  Are you having pain? No  FALLS: Has patient fallen in last 6 months? Yes. Number of falls 1  PLOF: Independent  PATIENT GOALS: To be able to use the RUE.   NEXT MD VISIT: unsure  OBJECTIVE:  Note: Objective measures were completed at Evaluation unless otherwise noted.  HAND DOMINANCE: Right  ADLs: Pt is unable to use the dominant RUE for any ADLs, cannot reach  overhead or behind back. Pt with min difficulty sleeping.    FUNCTIONAL OUTCOME MEASURES: Upper Extremity Functional Scale (UEFS): 16/80  UPPER EXTREMITY ROM:       Eval: Assessed in sitting, unable to assess P/ROM due to protocol   08/26/23: Assessed in sitting, er/IR adducted   09/27/23: Assessed in sitting, er/IR adducted  Active ROM Right eval Right 08/26/23 Right 09/27/23  Shoulder flexion 69 76 111  Shoulder abduction 56 58 160  Shoulder internal rotation 90 90 90  Shoulder external rotation 19 44 45  Elbow flexion 115 104 114  Elbow extension 36 45 29  Wrist flexion 32 40 52  Wrist extension 28 58 58  Wrist ulnar deviation 40    Wrist  radial deviation 10 22   Wrist pronation 90    Wrist supination 40 80 80  (Blank rows = not tested)   UPPER EXTREMITY MMT:       Eval: Assessed on observation due to precautions limiting formal MMT   08/26/23: Assessed seated, er/IR adducted   09/27/23: Assessed seated, er/IR adducted  MMT Right eval Right 08/26/23 Right 09/27/23  Shoulder flexion 3-/5 3-/5 3/5  Shoulder abduction 3-/5 3-/5 3/5  Shoulder internal rotation 3/5 3+/5 4/5  Shoulder external rotation 3-/5 3/5 3/5  Elbow flexion 3-/5 3+/5 4+/5  Elbow extension 3-/5 3+/5 4/5  Wrist flexion 3/5 4-/5 5/5  Wrist extension 3/5 4-/5 5/5  Wrist ulnar deviation 3/5 4-/5 4/5  Wrist radial deviation 3/5 4+/5 4+/5  Wrist pronation 3/5 4-/5 4+/5  Wrist supination 3-/5 4-/5 4/5  (Blank rows = not tested)  HAND FUNCTION: Grip strength: Right: 5 lbs; Left: 40 lbs 08/26/23: Right: 18 lbs 09/27/23: Right: 25 lbs  SENSATION: Tingling in fingers along ulnar nerve distribution  EDEMA:  Eval     Right:    Left:  Wrist     15.75cm  15.0cm Mid forearm(10cm from wrist)  16.5cm  16.0cm Elbow    27cm   24cm Mid upper arm (10cm from elbow) 30.5cm  27.5cm  08/26/23:      Right:      Wrist     15.25cm   Mid forearm(10cm from wrist)  16.5cm   Elbow    25.75cm    Mid upper arm (10cm from elbow) 28.5cm       OBSERVATIONS: moderate edema and fascial restrictions along RUE forearm and elbow, min edema in fingers and wrist   TREATMENT DATE:  09/29/23 -myofascial release to right volar elbow, upper arm, and trapezius regions to decrease pain and fascial restrictions and improve joint ROM -P/ROM: shoulder flexion, abduction; elbow flexion/extension 5 reps -A/ROM: supine-shoulder protraction, flexion, horizontal abduction, er, abduction 12 reps -Overhead lacing: seated, lacing from top down then reversing -A/ROM: sitting-bicep curl, hammer curl, 10 reps -Hand gripper task: large beads gripper horizontal at 25#, medium beads gripper  vertical at 20#, incorporating elbow flexion and extension into task   09/27/23 -Retrograde massage and myofascial release to right forearm, volar elbow, upper arm, and trapezius regions to decrease edema and improve joint ROM -P/ROM: shoulder flexion, abduction; elbow flexion/extension 5 reps -A/ROM: supine-shoulder protraction, flexion, horizontal abduction, er, abduction 12 reps -Hand gripper task: large and medium beads gripper vertical at 20#, incorporating elbow flexion into task -A/ROM: sitting-bicep curl, hammer curl, 10 reps  09/22/23 -Retrograde massage and myofascial release to right forearm, volar elbow, upper arm, and trapezius regions to decrease edema and improve joint ROM -P/ROM: shoulder flexion, abduction; elbow flexion/extension 5 reps -A/ROM:  supine-shoulder protraction, flexion, horizontal abduction, er, abduction 12 reps -Proximal shoulder strengthening: supine-paddles, criss cross, circles each direction, 10 reps -A/ROM: sitting-bicep curl, hammer curl, 10 reps -Elbow flexion stretch: leaning onto forearms on countertop progressing elbow flexion as able to tolerate, 3x20" holds -Hand gripper task: large and medium beads gripper vertical at 20#, incorporating elbow flexion into task -Wall wash: 1' flexion -UBE: level 1, 2' forward, 2' reverse, pace: 4.5      PATIENT EDUCATION: Education details: reviewed HEP Person educated: Patient and Caregiver step-son Education method: Explanation, Demonstration, and Handouts Education comprehension: verbalized understanding and returned demonstration  HOME EXERCISE PROGRAM: Eval: elbow and wrist A/ROM 08/18/23: Table slides 09/01/23: AA/ROM shoulder and elbow 09/13/23: Touching thumb to chin or nose 09/15/23: self-feeding with right hand 09/27/23: reaching into cabinets at home   GOALS: Goals reviewed with patient? Yes  SHORT TERM GOALS: Target date: 09/09/23  Pt will be provided with and educated on HEP to improve  mobility in RUE required for use during ADL completion.   Goal status: IN PROGRESS  2.  Pt will increase RUE A/ROM by 15+ degrees to improve ability to use RUE during dressing tasks with minimal compensatory techniques.   Goal status: MET  3.  Pt will increase RUE strength to 3+/5 to improve ability to reach for items at waist to chest height during bathing and grooming tasks.   Goal status: IN PROGRESS  4.  Pt will demonstrate decreased edema by at least 25% to improve ability to perform joint mobility tasks and improve functional use of the RUE during grooming tasks.    Goal status: MET    LONG TERM GOALS: Target date: 10/09/23  Pt will decrease pain in RUE to 3/10 or less to improve ability to perform HEP at recommended frequency and duration to improve mobility of RUE required for ADL completion.   Goal status: MET  2.  Pt will decrease RUE fascial restrictions to min amounts or less to improve mobility required for functional reaching tasks.   Goal status: IN PROGRESS  3.  Pt will increase RUE A/ROM by 30+ degrees to improve ability to use RUE when reaching overhead or behind back during dressing and bathing tasks.   Goal status: IN PROGRESS  4.  Pt will increase RUE strength to 4/5 or greater to improve ability to use RUE when lifting or carrying items during meal preparation/housework/yardwork tasks.   Goal status: IN PROGRESS   5.  Pt will increase RUE grip strength by 30# or greater to improve ability to use RUE when grasping and manipulating items for meal preparation or housework.   Goal status: IN PROGRESS    ASSESSMENT:  CLINICAL IMPRESSION: Pt reports she has been putting a bandaid over the scab on her elbow and is not wearing the splint in the community much now. Continued with manual techniques, passive stretching, and A/ROM. Added overhead lacing for activity tolerance and ROM work, rest breaks provided as needed. Continued with grip strengthening  increasing gripper resistance to 25# today, increased time for correct technique with large beads. Pt reports tremors at baseline when attempting to self-feed, educated on weighted utensils that may assist with this. Verbal cuing for form and technique during session activities.     PERFORMANCE DEFICITS: in functional skills including ADLs, IADLs, coordination, dexterity, proprioception, sensation, edema, ROM, strength, pain, fascial restrictions, muscle spasms, flexibility, UE functional use, and vestibular     PLAN:  OT FREQUENCY: 2x/week  OT DURATION: 8 weeks  PLANNED INTERVENTIONS: 97168 OT Re-evaluation, 97535 self care/ADL training, 19147 therapeutic exercise, 97530 therapeutic activity, 97112 neuromuscular re-education, 97140 manual therapy, 97035 ultrasound, 97018 paraffin, 82956 electrical stimulation unattended, patient/family education, and DME and/or AE instructions  CONSULTED AND AGREED WITH PLAN OF CARE: Patient  PLAN FOR NEXT SESSION: Follow up on HEP, continue with manual techniques, P/ROM, A/ROM, touching nose or chin for progress towards eating with the Thyra Flower, OTR/L  [phone] 09/29/2023, 12:07 PM   [employer] Medicare Auth Request Information  Date of referral: 08/08/23 Referring provider: Archer Kobs PA-C Referring diagnosis (ICD 10)? O96.295M  Treatment diagnosis (ICD 10)? (if different than referring diagnosis) M25.521, M25.621, R29.898, R27.8   Functional Tool Score: 16/80  What was this (referring dx) caused by? Surgery (Type: ORIF)  Nature of Condition: Initial Onset (within last 3 months)   Laterality: Rt  Objective measurements identify impairments when they are compared to normal values, the uninvolved extremity, and prior level of function.  [x]  Yes  []  No  Objective assessment of functional ability: Minimal functional limitations   Briefly describe symptoms: weakness of RUE, stiffness of right elbow, decreased functional  use  How did symptoms start: fall then surgery  Average pain intensity:  Last 24 hours: 0/10  Past week: 2/10  How often does the pt experience symptoms? Frequently  How much have the symptoms interfered with usual daily activities? A little bit  How has condition changed since care began at this facility? Better  In general, how is the patients overall health? Good   BACK PAIN (STarT Back Screening Tool) No

## 2023-10-04 ENCOUNTER — Ambulatory Visit (HOSPITAL_COMMUNITY): Admitting: Occupational Therapy

## 2023-10-04 ENCOUNTER — Encounter (HOSPITAL_COMMUNITY): Payer: Self-pay | Admitting: Occupational Therapy

## 2023-10-04 DIAGNOSIS — M25621 Stiffness of right elbow, not elsewhere classified: Secondary | ICD-10-CM | POA: Diagnosis not present

## 2023-10-04 DIAGNOSIS — R278 Other lack of coordination: Secondary | ICD-10-CM | POA: Diagnosis not present

## 2023-10-04 DIAGNOSIS — M25521 Pain in right elbow: Secondary | ICD-10-CM | POA: Diagnosis not present

## 2023-10-04 DIAGNOSIS — R29898 Other symptoms and signs involving the musculoskeletal system: Secondary | ICD-10-CM | POA: Diagnosis not present

## 2023-10-04 NOTE — Therapy (Signed)
 OUTPATIENT OCCUPATIONAL THERAPY ORTHO TREATMENT   Patient Name: Caitlyn Boone MRN: [id number] DOB:02-10-1941, 83 y.o., female Today's Date: 10/04/2023    END OF SESSION:  OT End of Session - 10/04/23 1059     Visit Number 17    Number of Visits 19    Date for OT Re-Evaluation 10/09/23    Authorization Type [employer] Medicare, $20 copay    Authorization Time Period 16 visits approved 08/10/23-10/05/23; requesting additional visits    Authorization - Number of Visits 16    Progress Note Due on Visit 20    OT Start Time 1056    OT Stop Time 1137    OT Time Calculation (min) 41 min    Activity Tolerance Patient tolerated treatment well    Behavior During Therapy WFL for tasks assessed/performed                Past Medical History:  Diagnosis Date   Compression fracture 05/24/2013   L1   GERD (gastroesophageal reflux disease)    Gout    Hyperlipidemia    Hypertension    Knee fracture, left feb 2015   no surgery done   Osteoporosis    Past Surgical History:  Procedure Laterality Date   ABDOMINAL HYSTERECTOMY  1992   complete   CHOLECYSTECTOMY N/A 06/28/2014   Procedure: LAPAROSCOPIC CHOLECYSTECTOMY ;  Surgeon: Boyce Byes, MD;  Location: [facility];  Service: General;  Laterality: N/A;   EYE SURGERY Bilateral 2013   lens for cataracts   LAMINECTOMY  1987   L5-S1   TONSILLECTOMY  1961   Patient Active Problem List   Diagnosis Date Noted   Anxiety 01/26/2023   Hypertriglyceridemia 08/18/2022   Elevated blood sugar 08/18/2022   Closed nondisplaced fracture of left patella 03/09/2018   Obesity (BMI 30.0-34.9) 02/16/2016   Gallstones 06/28/2014   Hyperlipidemia 02/19/2014   Osteoporosis 02/19/2014   Essential hypertension 02/19/2014   GERD (gastroesophageal reflux disease) 02/19/2014   Gout 02/19/2014   PCP: Dr. Melodie Spry Dettinger REFERRING PROVIDER: Wilfredo Hanly, PA-C  ONSET DATE: 07/21/23  REFERRING DIAG: s/p ORIF comminuted articular right distal humerus  fracture  THERAPY DIAG:  Pain in right elbow  Stiffness of right elbow, not elsewhere classified  Other symptoms and signs involving the musculoskeletal system  Other lack of coordination  Rationale for Evaluation and Treatment: Rehabilitation  SUBJECTIVE:   SUBJECTIVE STATEMENT: S: "I bought a set of those utensils and they really help."   PERTINENT HISTORY: Pt is an 83 y/o female s/p ORIF comminuted articular right distal humerus fracture on 07/21/23. Pt sustained injury when her foot caught on the threshold between 2 rooms and she fell. Pt presents in posterior long arm splint and sling today. Step-son present as caregiver.   PRECAUTIONS: Other: Strictly NWB; ok to begin A/ROM at 2 weeks, wear splint until follow up with MD at 6 weeks; follow [place]  Handbook Protocol    WEIGHT BEARING RESTRICTIONS: Yes NWB  PAIN:  Are you having pain? No  FALLS: Has patient fallen in last 6 months? Yes. Number of falls 1  PLOF: Independent  PATIENT GOALS: To be able to use the RUE.   NEXT MD VISIT: unsure  OBJECTIVE:  Note: Objective measures were completed at Evaluation unless otherwise noted.  HAND DOMINANCE: Right  ADLs: Pt is unable to use the dominant RUE for any ADLs, cannot reach overhead or behind back. Pt with min difficulty sleeping.    FUNCTIONAL OUTCOME MEASURES: Upper Extremity Functional Scale (UEFS): 16/80  UPPER EXTREMITY ROM:       Eval: Assessed in sitting, unable to assess P/ROM due to protocol   08/26/23: Assessed in sitting, er/IR adducted   09/27/23: Assessed in sitting, er/IR adducted  Active ROM Right eval Right 08/26/23 Right 09/27/23  Shoulder flexion 69 76 111  Shoulder abduction 56 58 160  Shoulder internal rotation 90 90 90  Shoulder external rotation 19 44 45  Elbow flexion 115 104 114  Elbow extension 36 45 29  Wrist flexion 32 40 52  Wrist extension 28 58 58  Wrist ulnar deviation 40    Wrist radial deviation 10 22   Wrist pronation 90     Wrist supination 40 80 80  (Blank rows = not tested)   UPPER EXTREMITY MMT:       Eval: Assessed on observation due to precautions limiting formal MMT   08/26/23: Assessed seated, er/IR adducted   09/27/23: Assessed seated, er/IR adducted  MMT Right eval Right 08/26/23 Right 09/27/23  Shoulder flexion 3-/5 3-/5 3/5  Shoulder abduction 3-/5 3-/5 3/5  Shoulder internal rotation 3/5 3+/5 4/5  Shoulder external rotation 3-/5 3/5 3/5  Elbow flexion 3-/5 3+/5 4+/5  Elbow extension 3-/5 3+/5 4/5  Wrist flexion 3/5 4-/5 5/5  Wrist extension 3/5 4-/5 5/5  Wrist ulnar deviation 3/5 4-/5 4/5  Wrist radial deviation 3/5 4+/5 4+/5  Wrist pronation 3/5 4-/5 4+/5  Wrist supination 3-/5 4-/5 4/5  (Blank rows = not tested)  HAND FUNCTION: Grip strength: Right: 5 lbs; Left: 40 lbs 08/26/23: Right: 18 lbs 09/27/23: Right: 25 lbs  SENSATION: Tingling in fingers along ulnar nerve distribution  EDEMA:  Eval     Right:    Left:  Wrist     15.75cm  15.0cm Mid forearm(10cm from wrist)  16.5cm  16.0cm Elbow    27cm   24cm Mid upper arm (10cm from elbow) 30.5cm  27.5cm  08/26/23:      Right:      Wrist     15.25cm   Mid forearm(10cm from wrist)  16.5cm   Elbow    25.75cm    Mid upper arm (10cm from elbow) 28.5cm       OBSERVATIONS: moderate edema and fascial restrictions along RUE forearm and elbow, min edema in fingers and wrist   TREATMENT DATE:  10/04/23 -myofascial release to right volar elbow, upper arm, and trapezius regions to decrease pain and fascial restrictions and improve joint ROM -P/ROM: shoulder flexion, abduction; elbow flexion/extension 5 reps -A/ROM: supine-shoulder protraction, flexion, horizontal abduction, er, 12 reps -Proximal shoulder strengthening: supine-paddles, criss cross, circles each direction, 10 reps -Elbow flexion stretch at countertop: 3x30" holds -A/ROM: hammer curl, bicep curl, 10 reps -Functional reaching: pt standing at overhead cabinets, placing 10  cones on middle shelf in flexion, removing in abduction -A/ROM: sitting-protraction, flexion, horizontal abduction, er, abduction, 10 reps -Hand gripper task: medium beads gripper vertical at 25#, incorporating elbow flexion and extension into task -UBE: level 1, 3' forward, 3' reverse, pace: 5.5  09/29/23 -myofascial release to right volar elbow, upper arm, and trapezius regions to decrease pain and fascial restrictions and improve joint ROM -P/ROM: shoulder flexion, abduction; elbow flexion/extension 5 reps -A/ROM: supine-shoulder protraction, flexion, horizontal abduction, er, abduction 12 reps -Overhead lacing: seated, lacing from top down then reversing -A/ROM: sitting-bicep curl, hammer curl, 10 reps -Hand gripper task: large beads gripper horizontal at 25#, medium beads gripper vertical at 20#, incorporating elbow flexion and extension into task   09/27/23 -Retrograde  massage and myofascial release to right forearm, volar elbow, upper arm, and trapezius regions to decrease edema and improve joint ROM -P/ROM: shoulder flexion, abduction; elbow flexion/extension 5 reps -A/ROM: supine-shoulder protraction, flexion, horizontal abduction, er, abduction 12 reps -Hand gripper task: large and medium beads gripper vertical at 20#, incorporating elbow flexion into task -A/ROM: sitting-bicep curl, hammer curl, 10 reps       PATIENT EDUCATION: Education details: reviewed HEP Person educated: Patient and Caregiver step-son Education method: Explanation, Demonstration, and Handouts Education comprehension: verbalized understanding and returned demonstration  HOME EXERCISE PROGRAM: Eval: elbow and wrist A/ROM 08/18/23: Table slides 09/01/23: AA/ROM shoulder and elbow 09/13/23: Touching thumb to chin or nose 09/15/23: self-feeding with right hand 09/27/23: reaching into cabinets at home   GOALS: Goals reviewed with patient? Yes  SHORT TERM GOALS: Target date: 09/09/23  Pt will be provided  with and educated on HEP to improve mobility in RUE required for use during ADL completion.   Goal status: IN PROGRESS  2.  Pt will increase RUE A/ROM by 15+ degrees to improve ability to use RUE during dressing tasks with minimal compensatory techniques.   Goal status: MET  3.  Pt will increase RUE strength to 3+/5 to improve ability to reach for items at waist to chest height during bathing and grooming tasks.   Goal status: IN PROGRESS  4.  Pt will demonstrate decreased edema by at least 25% to improve ability to perform joint mobility tasks and improve functional use of the RUE during grooming tasks.    Goal status: MET    LONG TERM GOALS: Target date: 10/09/23  Pt will decrease pain in RUE to 3/10 or less to improve ability to perform HEP at recommended frequency and duration to improve mobility of RUE required for ADL completion.   Goal status: MET  2.  Pt will decrease RUE fascial restrictions to min amounts or less to improve mobility required for functional reaching tasks.   Goal status: IN PROGRESS  3.  Pt will increase RUE A/ROM by 30+ degrees to improve ability to use RUE when reaching overhead or behind back during dressing and bathing tasks.   Goal status: IN PROGRESS  4.  Pt will increase RUE strength to 4/5 or greater to improve ability to use RUE when lifting or carrying items during meal preparation/housework/yardwork tasks.   Goal status: IN PROGRESS   5.  Pt will increase RUE grip strength by 30# or greater to improve ability to use RUE when grasping and manipulating items for meal preparation or housework.   Goal status: IN PROGRESS    ASSESSMENT:  CLINICAL IMPRESSION: Pt reports her granddaughter purchased weighted utensils and this has helped her be more successful when eating considering her BUE tremors. Continued with manual techniques, min fascial restrictions in the RUE today. P/ROM completed with pt tolerating ~70% ROM, continued with AA/ROM  noting continued improvements with range and activity tolerance in both the shoulder and the elbow. Elbow extension is limited however use of the RUE is functional. Continued with grip strengthening, increased resistance to 25# for medium beads. Added UBE task today. Verbal cuing for form and technique, intermittent visual demonstration.     PERFORMANCE DEFICITS: in functional skills including ADLs, IADLs, coordination, dexterity, proprioception, sensation, edema, ROM, strength, pain, fascial restrictions, muscle spasms, flexibility, UE functional use, and vestibular     PLAN:  OT FREQUENCY: 2x/week  OT DURATION: 8 weeks  PLANNED INTERVENTIONS: 97168 OT Re-evaluation, 97535 self care/ADL training, 16109  therapeutic exercise, 97530 therapeutic activity, 97112 neuromuscular re-education, 97140 manual therapy, 97035 ultrasound, 82956 paraffin, 97014 electrical stimulation unattended, patient/family education, and DME and/or AE instructions  CONSULTED AND AGREED WITH PLAN OF CARE: Patient  PLAN FOR NEXT SESSION: Reassessment, measure for MD appt   Lafonda Piety, OTR/L  [phone] 10/04/2023, 11:38 AM

## 2023-10-06 ENCOUNTER — Encounter (HOSPITAL_COMMUNITY): Payer: Self-pay | Admitting: Occupational Therapy

## 2023-10-06 ENCOUNTER — Ambulatory Visit (HOSPITAL_COMMUNITY): Attending: Physician Assistant | Admitting: Occupational Therapy

## 2023-10-06 DIAGNOSIS — R278 Other lack of coordination: Secondary | ICD-10-CM | POA: Insufficient documentation

## 2023-10-06 DIAGNOSIS — M25521 Pain in right elbow: Secondary | ICD-10-CM | POA: Insufficient documentation

## 2023-10-06 DIAGNOSIS — R29898 Other symptoms and signs involving the musculoskeletal system: Secondary | ICD-10-CM | POA: Diagnosis not present

## 2023-10-06 DIAGNOSIS — M25621 Stiffness of right elbow, not elsewhere classified: Secondary | ICD-10-CM | POA: Diagnosis not present

## 2023-10-06 NOTE — Therapy (Signed)
 OUTPATIENT OCCUPATIONAL THERAPY ORTHO TREATMENT REASSESSMENT AND RECERTIFICATION   Patient Name: Caitlyn Boone MRN: [id number] DOB:Jan 29, 1941, 83 y.o., female Today's Date: 10/06/2023    END OF SESSION:  OT End of Session - 10/06/23 1013     Visit Number 18    Number of Visits 26    Date for OT Re-Evaluation 11/05/23    Authorization Type [employer] Medicare, $20 copay    Authorization Time Period 16 visits approved 08/10/23-10/05/23; requesting additional visits    Authorization - Number of Visits 8    Progress Note Due on Visit 20    OT Start Time [phone]    OT Stop Time 1010    OT Time Calculation (min) 41 min    Activity Tolerance Patient tolerated treatment well    Behavior During Therapy [facility] for tasks assessed/performed                 Past Medical History:  Diagnosis Date   Compression fracture 05/24/2013   L1   GERD (gastroesophageal reflux disease)    Gout    Hyperlipidemia    Hypertension    Knee fracture, left feb 2015   no surgery done   Osteoporosis    Past Surgical History:  Procedure Laterality Date   ABDOMINAL HYSTERECTOMY  1992   complete   CHOLECYSTECTOMY N/A 06/28/2014   Procedure: LAPAROSCOPIC CHOLECYSTECTOMY ;  Surgeon: Boyce Byes, MD;  Location: [facility];  Service: General;  Laterality: N/A;   EYE SURGERY Bilateral 2013   lens for cataracts   LAMINECTOMY  1987   L5-S1   TONSILLECTOMY  1961   Patient Active Problem List   Diagnosis Date Noted   Anxiety 01/26/2023   Hypertriglyceridemia 08/18/2022   Elevated blood sugar 08/18/2022   Closed nondisplaced fracture of left patella 03/09/2018   Obesity (BMI 30.0-34.9) 02/16/2016   Gallstones 06/28/2014   Hyperlipidemia 02/19/2014   Osteoporosis 02/19/2014   Essential hypertension 02/19/2014   GERD (gastroesophageal reflux disease) 02/19/2014   Gout 02/19/2014   PCP: Dr. Melodie Spry Dettinger REFERRING PROVIDER: Wilfredo Hanly, PA-C  ONSET DATE: 07/21/23  REFERRING DIAG: s/p ORIF comminuted  articular right distal humerus fracture  THERAPY DIAG:  Pain in right elbow  Stiffness of right elbow, not elsewhere classified  Other symptoms and signs involving the musculoskeletal system  Other lack of coordination  Rationale for Evaluation and Treatment: Rehabilitation  SUBJECTIVE:   SUBJECTIVE STATEMENT: S: "I can do most anything I want to."   PERTINENT HISTORY: Pt is an 83 y/o female s/p ORIF comminuted articular right distal humerus fracture on 07/21/23. Pt sustained injury when her foot caught on the threshold between 2 rooms and she fell. Pt presents in posterior long arm splint and sling today. Step-son present as caregiver.   PRECAUTIONS: Other: Strictly NWB; ok to begin A/ROM at 2 weeks, wear splint until follow up with MD at 6 weeks; follow [place]  Handbook Protocol    WEIGHT BEARING RESTRICTIONS: Yes NWB  PAIN:  Are you having pain? No  FALLS: Has patient fallen in last 6 months? Yes. Number of falls 1  PLOF: Independent  PATIENT GOALS: To be able to use the RUE.   NEXT MD VISIT: unsure  OBJECTIVE:  Note: Objective measures were completed at Evaluation unless otherwise noted.  HAND DOMINANCE: Right  ADLs: Pt is unable to use the dominant RUE for any ADLs, cannot reach overhead or behind back. Pt with min difficulty sleeping.    FUNCTIONAL OUTCOME MEASURES: Upper Extremity Functional Scale (UEFS):  16/80 10/06/23: 48/80  UPPER EXTREMITY ROM:       Eval: Assessed in sitting, unable to assess P/ROM due to protocol   08/26/23: Assessed in sitting, er/IR adducted   09/27/23: Assessed in sitting, er/IR adducted   10/06/23: Assessed in sitting, er/IR adducted  Active ROM Right eval Right 08/26/23 Right 09/27/23 Right 10/06/23  Shoulder flexion 69 76 111 111  Shoulder abduction 56 58 160 160  Shoulder internal rotation 90 90 90 90  Shoulder external rotation 19 44 45 55  Elbow flexion 115 104 114 115  Elbow extension 36 45 29 27  Wrist flexion 32 40 52 52   Wrist extension 28 58 58 58  Wrist ulnar deviation 40     Wrist radial deviation 10 22    Wrist pronation 90     Wrist supination 40 80 80 80  (Blank rows = not tested)   UPPER EXTREMITY MMT:       Eval: Assessed on observation due to precautions limiting formal MMT   08/26/23: Assessed seated, er/IR adducted   09/27/23: Assessed seated, er/IR adducted   10/06/23: Assessed seated, er/IR adducted  MMT Right eval Right 08/26/23 Right 09/27/23 Right 10/06/23  Shoulder flexion 3-/5 3-/5 3/5 3/5  Shoulder abduction 3-/5 3-/5 3/5 3/5  Shoulder internal rotation 3/5 3+/5 4/5 4/5  Shoulder external rotation 3-/5 3/5 3/5 3/5  Elbow flexion 3-/5 3+/5 4+/5 4+/5  Elbow extension 3-/5 3+/5 4/5 4+/5  Wrist flexion 3/5 4-/5 5/5   Wrist extension 3/5 4-/5 5/5   Wrist ulnar deviation 3/5 4-/5 4/5 4/5  Wrist radial deviation 3/5 4+/5 4+/5 4+/5  Wrist pronation 3/5 4-/5 4+/5 4+/5  Wrist supination 3-/5 4-/5 4/5 4+/5  (Blank rows = not tested)  HAND FUNCTION: Grip strength: Right: 5 lbs; Left: 40 lbs 08/26/23: Right: 18 lbs 09/27/23: Right: 25 lbs 10/06/23: Right: 32lbs  SENSATION: Tingling in fingers along ulnar nerve distribution  EDEMA:  Eval     Right:    Left:  Wrist     15.75cm  15.0cm Mid forearm(10cm from wrist)  16.5cm  16.0cm Elbow    27cm   24cm Mid upper arm (10cm from elbow) 30.5cm  27.5cm  08/26/23:      Right:      Wrist     15.25cm   Mid forearm(10cm from wrist)  16.5cm   Elbow    25.75cm    Mid upper arm (10cm from elbow) 28.5cm        OBSERVATIONS: moderate edema and fascial restrictions along RUE forearm and elbow, min edema in fingers and wrist   TREATMENT DATE:   10/06/23 -myofascial release to right volar elbow, upper arm, and trapezius regions to decrease pain and fascial restrictions and improve joint ROM -P/ROM: shoulder flexion, abduction; elbow flexion/extension 5 reps -A/ROM: sitting-protraction, flexion, horizontal abduction, er, abduction, 12  reps -Strengthening: 2#-hammer curl, bicep curl, 10 reps -Proximal shoulder strengthening: sitting-paddles, criss cross, circles each direction, 10 reps -Functional reaching: pt standing at overhead cabinets, placing 7 cones on middle shelf in flexion, removing in abduction -Hand gripper task: medium beads gripper vertical at 25#, incorporating elbow flexion and extension into task -[place]: pt placing resistive clothespins along vertical bar  10/04/23 -myofascial release to right volar elbow, upper arm, and trapezius regions to decrease pain and fascial restrictions and improve joint ROM -P/ROM: shoulder flexion, abduction; elbow flexion/extension 5 reps -A/ROM: supine-shoulder protraction, flexion, horizontal abduction, er, 12 reps -Proximal shoulder strengthening: supine-paddles, criss cross, circles  each direction, 10 reps -Elbow flexion stretch at countertop: 3x30" holds -A/ROM: hammer curl, bicep curl, 10 reps -Functional reaching: pt standing at overhead cabinets, placing 10 cones on middle shelf in flexion, removing in abduction -A/ROM: sitting-protraction, flexion, horizontal abduction, er, abduction, 10 reps -Hand gripper task: medium beads gripper vertical at 25#, incorporating elbow flexion and extension into task -UBE: level 1, 3' forward, 3' reverse, pace: 5.5  09/29/23 -myofascial release to right volar elbow, upper arm, and trapezius regions to decrease pain and fascial restrictions and improve joint ROM -P/ROM: shoulder flexion, abduction; elbow flexion/extension 5 reps -A/ROM: supine-shoulder protraction, flexion, horizontal abduction, er, abduction 12 reps -Overhead lacing: seated, lacing from top down then reversing -A/ROM: sitting-bicep curl, hammer curl, 10 reps -Hand gripper task: large beads gripper horizontal at 25#, medium beads gripper vertical at 20#, incorporating elbow flexion and extension into task      PATIENT EDUCATION: Education details: reviewed  HEP Person educated: Patient and Caregiver step-son Education method: Explanation, Demonstration, and Handouts Education comprehension: verbalized understanding and returned demonstration  HOME EXERCISE PROGRAM: Eval: elbow and wrist A/ROM 08/18/23: Table slides 09/01/23: AA/ROM shoulder and elbow 09/13/23: Touching thumb to chin or nose 09/15/23: self-feeding with right hand 09/27/23: reaching into cabinets at home   GOALS: Goals reviewed with patient? Yes  SHORT TERM GOALS: Target date: 09/09/23  Pt will be provided with and educated on HEP to improve mobility in RUE required for use during ADL completion.   Goal status: IN PROGRESS  2.  Pt will increase RUE A/ROM by 15+ degrees to improve ability to use RUE during dressing tasks with minimal compensatory techniques.   Goal status: MET  3.  Pt will increase RUE strength to 3+/5 to improve ability to reach for items at waist to chest height during bathing and grooming tasks.   Goal status: IN PROGRESS  4.  Pt will demonstrate decreased edema by at least 25% to improve ability to perform joint mobility tasks and improve functional use of the RUE during grooming tasks.    Goal status: MET    LONG TERM GOALS: Target date: 10/09/23  Pt will decrease pain in RUE to 3/10 or less to improve ability to perform HEP at recommended frequency and duration to improve mobility of RUE required for ADL completion.   Goal status: MET  2.  Pt will decrease RUE fascial restrictions to min amounts or less to improve mobility required for functional reaching tasks.   Goal status: MET  3.  Pt will increase RUE A/ROM by 30+ degrees to improve ability to use RUE when reaching overhead or behind back during dressing and bathing tasks.   Goal status: IN PROGRESS  4.  Pt will increase RUE strength to 4/5 or greater to improve ability to use RUE when lifting or carrying items during meal preparation/housework/yardwork tasks.   Goal status: IN  PROGRESS   5.  Pt will increase RUE grip strength by 30# or greater to improve ability to use RUE when grasping and manipulating items for meal preparation or housework.   Goal status: MET    ASSESSMENT:  CLINICAL IMPRESSION: Reassessment completed this session, pt continues to make slow progress towards her goals. Pt has met 2/4 STGs and 3/5 LTGs thus far. Pt demonstrates improved ROM and strength since previous reassessment in March, has maintained current measurements since most recent measurements on 09/16/23. Pt reports she is able to do her dishes and sweep, does not have any difficulty with ADLs and  is able to feed herself with her right hand. Pt reports reaching into overhead cabinets and her strength are her primary deficits. Pt agreeable to plan of continuing therapy for 4 additional weeks and focusing on end ROM and strengthening within the available ROM.     PERFORMANCE DEFICITS: in functional skills including ADLs, IADLs, coordination, dexterity, proprioception, sensation, edema, ROM, strength, pain, fascial restrictions, muscle spasms, flexibility, UE functional use, and vestibular     PLAN:  OT FREQUENCY: 2x/week  OT DURATION: 4 weeks  PLANNED INTERVENTIONS: 97168 OT Re-evaluation, 97535 self care/ADL training, 14782 therapeutic exercise, 97530 therapeutic activity, 97112 neuromuscular re-education, 97140 manual therapy, 97035 ultrasound, 97018 paraffin, 95621 electrical stimulation unattended, patient/family education, and DME and/or AE instructions  CONSULTED AND AGREED WITH PLAN OF CARE: Patient  PLAN FOR NEXT SESSION: Follow up on MD appt, focus on end ROM and strengthening, functional reaching tasks   Lafonda Piety, OTR/L  [phone] 10/06/2023, 10:17 AM   [facility] Medicare Auth Request Information  Date of referral: 08/08/23 Referring provider: Archer Kobs, PA-C Referring diagnosis (ICD 10)?  G29.528U  Treatment diagnosis (ICD 10)? (if different than  referring diagnosis) M25.521, M25.621, R29.898, R27.8   Functional Tool Score: UEFS 48/80  What was this (referring dx) caused by? Surgery (Type: ORIF)  Nature of Condition: Initial Onset (within last 3 months)   Laterality: Rt  Objective measurements identify impairments when they are compared to normal values, the uninvolved extremity, and prior level of function.  [x]  Yes  []  No  Objective assessment of functional ability: Minimal functional limitations   Briefly describe symptoms: pain, stiffness, decreased ROM and strength in the RUE, moderate edema and fascial restriction along RUE, decreased functional use   How did symptoms start: fall then surgery  Average pain intensity:  Last 24 hours: 5/10  Past week: 5/10  How often does the pt experience symptoms? Frequently  How much have the symptoms interfered with usual daily activities? Moderately  How has condition changed since care began at this facility? Better  In general, how is the patients overall health? Good   BACK PAIN (STarT Back Screening Tool) No

## 2023-10-07 DIAGNOSIS — S42491D Other displaced fracture of lower end of right humerus, subsequent encounter for fracture with routine healing: Secondary | ICD-10-CM | POA: Diagnosis not present

## 2023-10-07 DIAGNOSIS — S42411D Displaced simple supracondylar fracture without intercondylar fracture of right humerus, subsequent encounter for fracture with routine healing: Secondary | ICD-10-CM | POA: Diagnosis not present

## 2023-10-07 DIAGNOSIS — S42491A Other displaced fracture of lower end of right humerus, initial encounter for closed fracture: Secondary | ICD-10-CM | POA: Diagnosis not present

## 2023-10-07 DIAGNOSIS — S52291D Other fracture of shaft of right ulna, subsequent encounter for closed fracture with routine healing: Secondary | ICD-10-CM | POA: Diagnosis not present

## 2023-10-07 DIAGNOSIS — S52041D Displaced fracture of coronoid process of right ulna, subsequent encounter for closed fracture with routine healing: Secondary | ICD-10-CM | POA: Diagnosis not present

## 2023-10-07 DIAGNOSIS — Z09 Encounter for follow-up examination after completed treatment for conditions other than malignant neoplasm: Secondary | ICD-10-CM | POA: Diagnosis not present

## 2023-10-11 ENCOUNTER — Encounter (HOSPITAL_COMMUNITY): Admitting: Occupational Therapy

## 2023-10-11 ENCOUNTER — Ambulatory Visit (HOSPITAL_COMMUNITY): Admitting: Occupational Therapy

## 2023-10-11 ENCOUNTER — Encounter (HOSPITAL_COMMUNITY): Payer: Self-pay | Admitting: Occupational Therapy

## 2023-10-11 ENCOUNTER — Ambulatory Visit

## 2023-10-11 ENCOUNTER — Ambulatory Visit: Admitting: Vascular Surgery

## 2023-10-11 ENCOUNTER — Encounter: Payer: Self-pay | Admitting: Vascular Surgery

## 2023-10-11 VITALS — BP 119/77 | HR 74 | Temp 98.0°F | Resp 20 | Ht 63.0 in | Wt 189.0 lb

## 2023-10-11 DIAGNOSIS — R278 Other lack of coordination: Secondary | ICD-10-CM

## 2023-10-11 DIAGNOSIS — M25521 Pain in right elbow: Secondary | ICD-10-CM

## 2023-10-11 DIAGNOSIS — I6501 Occlusion and stenosis of right vertebral artery: Secondary | ICD-10-CM | POA: Diagnosis not present

## 2023-10-11 DIAGNOSIS — R29898 Other symptoms and signs involving the musculoskeletal system: Secondary | ICD-10-CM

## 2023-10-11 DIAGNOSIS — M25621 Stiffness of right elbow, not elsewhere classified: Secondary | ICD-10-CM | POA: Diagnosis not present

## 2023-10-11 DIAGNOSIS — I6523 Occlusion and stenosis of bilateral carotid arteries: Secondary | ICD-10-CM

## 2023-10-11 NOTE — Patient Instructions (Signed)
 Repeat all exercises 10-15 times, 1-2 times per day.  1) Shoulder Protraction    Begin with elbows by your side, slowly "punch" straight out in front of you.      2) Shoulder Flexion Standing:         Begin with arms at your side with thumbs pointed up, slowly raise both arms up and forward towards overhead.               3) Horizontal abduction/adduction  Standing:           Begin with arms straight out in front of you, bring out to the side in at "T" shape. Keep arms straight entire time.                 4) Internal & External Rotation  Standing:     Stand with elbows at the side and elbows bent 90 degrees. Move your forearms away from your body, then bring back inward toward the body.     5) Shoulder Abduction  Standing:       Lying on your back begin with your arms flat on the table next to your side. Slowly move your arms out to the side so that they go overhead, in a jumping jack or snow angel movement.

## 2023-10-11 NOTE — Progress Notes (Signed)
 Patient name: CATRICIA WILKINSON MRN: [id number] DOB: 1941/05/13 Sex: female  REASON FOR CONSULT: Right vertebral artery narrowing  HPI: JUNG BURBACK is a 83 y.o. female, with history of hypertension and hyperlipidemia that presents as a referral for evaluation of right vertebral artery narrowing.  Patient had a fall with a right humerus fracture and was hospitalized in February 2025 at [facility].  During that hospitalization they did a CTA neck showing mild narrowing of the right vertebral artery at C2 that was age-indeterminate and could represent grade 1 injury versus vasospasm.  Patient denies any stroke or TIA symptoms.  Past Medical History:  Diagnosis Date   Compression fracture 05/24/2013   L1   GERD (gastroesophageal reflux disease)    Gout    Hyperlipidemia    Hypertension    Knee fracture, left feb 2015   no surgery done   Osteoporosis     Past Surgical History:  Procedure Laterality Date   ABDOMINAL HYSTERECTOMY  1992   complete   CHOLECYSTECTOMY N/A 06/28/2014   Procedure: LAPAROSCOPIC CHOLECYSTECTOMY ;  Surgeon: Boyce Byes, MD;  Location: [facility];  Service: General;  Laterality: N/A;   EYE SURGERY Bilateral 2013   lens for cataracts   LAMINECTOMY  1987   L5-S1   TONSILLECTOMY  1961    Family History  Problem Relation Age of Onset   Heart disease Mother    Cancer Father        smoker   Cancer Brother        lung and colon   Colon cancer Brother    Hip fracture Brother    Dementia Brother 56   COPD Brother    Parkinson's disease Brother     SOCIAL HISTORY: Social History   Socioeconomic History   Marital status: Married    Spouse name: Not on file   Number of children: 0   Years of education: 12   Highest education level: 12th grade  Occupational History   Occupation: Retired  Tobacco Use   Smoking status: Former    Current packs/day: 0.00    Types: Cigarettes    Quit date: 02/24/1979    Years since quitting: 44.6   Smokeless tobacco:  Never   Tobacco comments:    smoked 2-3 cig a day when smoking, quit long ago  Vaping Use   Vaping status: Never Used  Substance and Sexual Activity   Alcohol use: No    Alcohol/week: 0.0 standard drinks of alcohol   Drug use: No   Sexual activity: Not Currently  Other Topics Concern   Not on file  Social History Narrative   Has 3 step-children - one passed away   Ex son-in-law lives nearby and checks on her often   Social Drivers of Health   Financial Resource Strain: Low Risk  (09/12/2023)   Overall Financial Resource Strain (CARDIA)    Difficulty of Paying Living Expenses: Not hard at all  Food Insecurity: No Food Insecurity (09/12/2023)   Hunger Vital Sign    Worried About Running Out of Food in the Last Year: Never true    Ran Out of Food in the Last Year: Never true  Transportation Needs: No Transportation Needs (09/12/2023)   PRAPARE - Administrator, Civil Service (Medical): No    Lack of Transportation (Non-Medical): No  Physical Activity: Inactive (09/12/2023)   Exercise Vital Sign    Days of Exercise per Week: 0 days    Minutes of  Exercise per Session: 0 min  Stress: No Stress Concern Present (09/12/2023)   [employer] of Occupational Health - Occupational Stress Questionnaire    Feeling of Stress : Not at all  Social Connections: Socially Integrated (09/12/2023)   Social Connection and Isolation Panel [NHANES]    Frequency of Communication with Friends and Family: More than three times a week    Frequency of Social Gatherings with Friends and Family: Once a week    Attends Religious Services: More than 4 times per year    Active Member of [employer] or Organizations: Yes    Attends Engineer, structural: More than 4 times per year    Marital Status: Married  Catering manager Violence: Not At Risk (09/12/2023)   Humiliation, Afraid, Rape, and Kick questionnaire    Fear of Current or Ex-Partner: No    Emotionally Abused: No    Physically Abused: No     Sexually Abused: No    No Known Allergies  Current Outpatient Medications  Medication Sig Dispense Refill   alendronate  (FOSAMAX ) 70 MG tablet Take 1 tablet (70 mg total) by mouth every 7 (seven) days. Take with a full glass of water on an empty stomach. 4 tablet 11   allopurinol  (ZYLOPRIM ) 300 MG tablet Take 1 tablet by mouth once daily 90 tablet 0   amLODipine  (NORVASC ) 5 MG tablet Take 1 tablet (5 mg total) by mouth daily. 90 tablet 3   aspirin 325 MG tablet Take 1 tablet by mouth daily.     Calcium Citrate 1040 MG TABS Take by mouth.     Cholecalciferol (VITAMIN D ) 2000 UNITS CAPS Take 1 capsule by mouth daily.     ciclopirox  (PENLAC ) 8 % solution Apply topically at bedtime. Apply over nail and surrounding skin. Apply daily over previous coat. After seven (7) days, may remove with alcohol and continue cycle. 6.6 mL 0   DULoxetine  (CYMBALTA ) 30 MG capsule Take 1 capsule by mouth once daily 90 capsule 1   furosemide  (LASIX ) 20 MG tablet Take 1 tablet (20 mg total) by mouth daily as needed. 90 tablet 1   hydrochlorothiazide  (MICROZIDE ) 12.5 MG capsule Take 1 capsule by mouth once daily 90 capsule 1   lisinopril  (ZESTRIL ) 40 MG tablet Take 1 tablet by mouth once daily 90 tablet 0   loratadine (CLARITIN) 10 MG tablet Take 10 mg by mouth daily.     meclizine  (ANTIVERT ) 25 MG tablet Take 1 tablet (25 mg total) by mouth 3 (three) times daily as needed for dizziness. 90 tablet 3   Multiple Vitamin (MULTIVITAMIN WITH MINERALS) TABS tablet Take 1 tablet by mouth daily.     Omega-3 Fatty Acids (FISH OIL) 1000 MG CAPS Take 1 capsule by mouth.     omeprazole  (PRILOSEC) 20 MG capsule Take 1 capsule by mouth once daily 90 capsule 1   pravastatin  (PRAVACHOL ) 80 MG tablet Take 1 tablet (80 mg total) by mouth every evening. 90 tablet 3   No current facility-administered medications for this visit.    REVIEW OF SYSTEMS:  [X]  denotes positive finding, [ ]  denotes negative finding Cardiac  Comments:   Chest pain or chest pressure:    Shortness of breath upon exertion:    Short of breath when lying flat:    Irregular heart rhythm:        Vascular    Pain in calf, thigh, or hip brought on by ambulation:    Pain in feet at night that wakes you up  from your sleep:     Blood clot in your veins:    Leg swelling:         Pulmonary    Oxygen at home:    Productive cough:     Wheezing:         Neurologic    Sudden weakness in arms or legs:     Sudden numbness in arms or legs:     Sudden onset of difficulty speaking or slurred speech:    Temporary loss of vision in one eye:     Problems with dizziness:         Gastrointestinal    Blood in stool:     Vomited blood:         Genitourinary    Burning when urinating:     Blood in urine:        Psychiatric    Major depression:         Hematologic    Bleeding problems:    Problems with blood clotting too easily:        Skin    Rashes or ulcers:        Constitutional    Fever or chills:      PHYSICAL EXAM: There were no vitals filed for this visit.  GENERAL: The patient is a well-nourished female, in no acute distress. The vital signs are documented above. CARDIAC: There is a regular rate and rhythm.  VASCULAR:  No prior neck incisions PULMONARY: No respiratory distress. ABDOMEN: Soft and non-tender. MUSCULOSKELETAL: There are no major deformities or cyanosis. NEUROLOGIC: No focal weakness or paresthesias are detected.  CN II-XII grossly intact. SKIN: There are no ulcers or rashes noted. PSYCHIATRIC: The patient has a normal affect.  DATA:   Carotid ultrasound today shows minimal 1 to 39% stenosis by velocity criteria bilaterally.  Both vertebral arteries are patent with normal antegrade flow.  CTA Angio Neck 07/17/23:  IMPRESSION:  Mild narrowing of the right vertebral artery exiting the right C2 transverse foramen. This finding is indeterminate and may represent a grade I vessel injury or vasospasm. Consider  repeat CTA neck in 24-48 hours to assess for change.   Assessment/Plan:  83 y.o. female, with history of hypertension and hyperlipidemia that presents as a referral for evaluation of right vertebral artery narrowing.  Patient had a fall with a right humerus fracture and was hospitalized in February 2025.  During this workup for her mechanical fall she did have a CTA neck.  I do not have the images but the report states mild narrowing of the right vertebral artery at the C2 concerning for injury versus vasospasm.  Ultimately we got a carotid duplex today.  This is really a normal duplex.  She has minimal plaque in her carotid arteries.  Both vertebral arteries have normal antegrade flow.  She has had no other stroke or TIA symptoms.  I do not think this warrants any further workup or intervention.  She is on aspirin for risk reduction which is appropriate and antiplatelet therapy alone is more than adequate.  I discussed she can take an 81 mg aspirin for my standpoint.  She can follow-up as needed.  I would not pursue further imaging at this time unless she has other issues in the future.   Young Hensen, MD [facility] Office: [phone]

## 2023-10-11 NOTE — Therapy (Signed)
 OUTPATIENT OCCUPATIONAL THERAPY ORTHO TREATMENT    Patient Name: Caitlyn Boone MRN: [id number] DOB:1941/01/04, 83 y.o., female Today's Date: 10/11/2023    END OF SESSION:  OT End of Session - 10/11/23 1231     Visit Number 19    Number of Visits 26    Date for OT Re-Evaluation 11/05/23    Authorization Type [employer] Medicare, $20 copay    Authorization Time Period [facility] medicare approved 8 visits from 10/04/23-11/01/23    Authorization - Visit Number 2    Authorization - Number of Visits 8    Progress Note Due on Visit 20    OT Start Time 1145    OT Stop Time 1232    OT Time Calculation (min) 47 min    Activity Tolerance Patient tolerated treatment well    Behavior During Therapy [facility] for tasks assessed/performed                  Past Medical History:  Diagnosis Date   Compression fracture 05/24/2013   L1   GERD (gastroesophageal reflux disease)    Gout    Hyperlipidemia    Hypertension    Knee fracture, left feb 2015   no surgery done   Osteoporosis    Past Surgical History:  Procedure Laterality Date   ABDOMINAL HYSTERECTOMY  1992   complete   CHOLECYSTECTOMY N/A 06/28/2014   Procedure: LAPAROSCOPIC CHOLECYSTECTOMY ;  Surgeon: Boyce Byes, MD;  Location: [facility];  Service: General;  Laterality: N/A;   EYE SURGERY Bilateral 2013   lens for cataracts   LAMINECTOMY  1987   L5-S1   TONSILLECTOMY  1961   Patient Active Problem List   Diagnosis Date Noted   Anxiety 01/26/2023   Hypertriglyceridemia 08/18/2022   Elevated blood sugar 08/18/2022   Closed nondisplaced fracture of left patella 03/09/2018   Obesity (BMI 30.0-34.9) 02/16/2016   Gallstones 06/28/2014   Hyperlipidemia 02/19/2014   Osteoporosis 02/19/2014   Essential hypertension 02/19/2014   GERD (gastroesophageal reflux disease) 02/19/2014   Gout 02/19/2014   PCP: Dr. Melodie Spry Dettinger REFERRING PROVIDER: Wilfredo Hanly, PA-C  ONSET DATE: 07/21/23  REFERRING DIAG: s/p ORIF comminuted articular  right distal humerus fracture  THERAPY DIAG:  Pain in right elbow  Stiffness of right elbow, not elsewhere classified  Other symptoms and signs involving the musculoskeletal system  Other lack of coordination  Rationale for Evaluation and Treatment: Rehabilitation  SUBJECTIVE:   SUBJECTIVE STATEMENT: S: "I've been working on cleaning my house."  PERTINENT HISTORY: Pt is an 83 y/o female s/p ORIF comminuted articular right distal humerus fracture on 07/21/23. Pt sustained injury when her foot caught on the threshold between 2 rooms and she fell. Pt presents in posterior long arm splint and sling today. Step-son present as caregiver.   PRECAUTIONS: Other: Strictly NWB; ok to begin A/ROM at 2 weeks, wear splint until follow up with MD at 6 weeks; follow [place]  Handbook Protocol    WEIGHT BEARING RESTRICTIONS: Yes NWB  PAIN:  Are you having pain? No  FALLS: Has patient fallen in last 6 months? Yes. Number of falls 1  PLOF: Independent  PATIENT GOALS: To be able to use the RUE.   NEXT MD VISIT: unsure  OBJECTIVE:  Note: Objective measures were completed at Evaluation unless otherwise noted.  HAND DOMINANCE: Right  ADLs: Pt is unable to use the dominant RUE for any ADLs, cannot reach overhead or behind back. Pt with min difficulty sleeping.    FUNCTIONAL OUTCOME MEASURES:  Upper Extremity Functional Scale (UEFS): 16/80 10/06/23: 48/80  UPPER EXTREMITY ROM:       Eval: Assessed in sitting, unable to assess P/ROM due to protocol   08/26/23: Assessed in sitting, er/IR adducted   09/27/23: Assessed in sitting, er/IR adducted   10/06/23: Assessed in sitting, er/IR adducted  Active ROM Right eval Right 08/26/23 Right 09/27/23 Right 10/06/23  Shoulder flexion 69 76 111 111  Shoulder abduction 56 58 160 160  Shoulder internal rotation 90 90 90 90  Shoulder external rotation 19 44 45 55  Elbow flexion 115 104 114 115  Elbow extension 36 45 29 27  Wrist flexion 32 40 52 52   Wrist extension 28 58 58 58  Wrist ulnar deviation 40     Wrist radial deviation 10 22    Wrist pronation 90     Wrist supination 40 80 80 80  (Blank rows = not tested)   UPPER EXTREMITY MMT:       Eval: Assessed on observation due to precautions limiting formal MMT   08/26/23: Assessed seated, er/IR adducted   09/27/23: Assessed seated, er/IR adducted   10/06/23: Assessed seated, er/IR adducted  MMT Right eval Right 08/26/23 Right 09/27/23 Right 10/06/23  Shoulder flexion 3-/5 3-/5 3/5 3/5  Shoulder abduction 3-/5 3-/5 3/5 3/5  Shoulder internal rotation 3/5 3+/5 4/5 4/5  Shoulder external rotation 3-/5 3/5 3/5 3/5  Elbow flexion 3-/5 3+/5 4+/5 4+/5  Elbow extension 3-/5 3+/5 4/5 4+/5  Wrist flexion 3/5 4-/5 5/5   Wrist extension 3/5 4-/5 5/5   Wrist ulnar deviation 3/5 4-/5 4/5 4/5  Wrist radial deviation 3/5 4+/5 4+/5 4+/5  Wrist pronation 3/5 4-/5 4+/5 4+/5  Wrist supination 3-/5 4-/5 4/5 4+/5  (Blank rows = not tested)  HAND FUNCTION: Grip strength: Right: 5 lbs; Left: 40 lbs 08/26/23: Right: 18 lbs 09/27/23: Right: 25 lbs 10/06/23: Right: 32lbs  SENSATION: Tingling in fingers along ulnar nerve distribution  EDEMA:  Eval     Right:    Left:  Wrist     15.75cm  15.0cm Mid forearm(10cm from wrist)  16.5cm  16.0cm Elbow    27cm   24cm Mid upper arm (10cm from elbow) 30.5cm  27.5cm  08/26/23:      Right:      Wrist     15.25cm   Mid forearm(10cm from wrist)  16.5cm   Elbow    25.75cm    Mid upper arm (10cm from elbow) 28.5cm        OBSERVATIONS: moderate edema and fascial restrictions along RUE forearm and elbow, min edema in fingers and wrist   TREATMENT DATE:  10/11/23 -myofascial release to right volar elbow, upper arm, and trapezius regions to decrease pain and fascial restrictions and improve joint ROM -P/ROM: shoulder flexion, abduction; elbow flexion/extension 5 reps -A/ROM: sitting-protraction, flexion, horizontal abduction, er, abduction, 15  reps -Proximal shoulder strengthening: sitting-paddles, criss cross, circles each direction, 12 reps -Functional reaching: pt standing at overhead cabinets, placing 8 cones on middle shelf in flexion, removing in abduction -Scapular theraband: red-row, extension, 10 reps -Bicep curl: red theraband, 10 reps -Overhead lacing: seated, lacing from top down then reversing, verbal cuing for lean -Hand gripper task: medium beads gripper vertical at 25#, small beads at 22# with gripper horizontal, incorporating elbow flexion and extension into task  10/06/23 -myofascial release to right volar elbow, upper arm, and trapezius regions to decrease pain and fascial restrictions and improve joint ROM -P/ROM: shoulder flexion, abduction;  elbow flexion/extension 5 reps -A/ROM: sitting-protraction, flexion, horizontal abduction, er, abduction, 12 reps -Strengthening: 2#-hammer curl, bicep curl, 10 reps -Proximal shoulder strengthening: sitting-paddles, criss cross, circles each direction, 10 reps -Functional reaching: pt standing at overhead cabinets, placing 7 cones on middle shelf in flexion, removing in abduction -Hand gripper task: medium beads gripper vertical at 25#, incorporating elbow flexion and extension into task -[place]: pt placing resistive clothespins along vertical bar  10/04/23 -myofascial release to right volar elbow, upper arm, and trapezius regions to decrease pain and fascial restrictions and improve joint ROM -P/ROM: shoulder flexion, abduction; elbow flexion/extension 5 reps -A/ROM: supine-shoulder protraction, flexion, horizontal abduction, er, 12 reps -Proximal shoulder strengthening: supine-paddles, criss cross, circles each direction, 10 reps -Elbow flexion stretch at countertop: 3x30" holds -A/ROM: hammer curl, bicep curl, 10 reps -Functional reaching: pt standing at overhead cabinets, placing 10 cones on middle shelf in flexion, removing in abduction -A/ROM: sitting-protraction,  flexion, horizontal abduction, er, abduction, 10 reps -Hand gripper task: medium beads gripper vertical at 25#, incorporating elbow flexion and extension into task -UBE: level 1, 3' forward, 3' reverse, pace: 5.5      PATIENT EDUCATION: Education details: reviewed HEP Person educated: Patient and Caregiver step-son Education method: Explanation, Demonstration, and Handouts Education comprehension: verbalized understanding and returned demonstration  HOME EXERCISE PROGRAM: Eval: elbow and wrist A/ROM 08/18/23: Table slides 09/01/23: AA/ROM shoulder and elbow 09/13/23: Touching thumb to chin or nose 09/15/23: self-feeding with right hand 09/27/23: reaching into cabinets at home 10/11/23: Shoulder A/ROM   GOALS: Goals reviewed with patient? Yes  SHORT TERM GOALS: Target date: 09/09/23  Pt will be provided with and educated on HEP to improve mobility in RUE required for use during ADL completion.   Goal status: IN PROGRESS  2.  Pt will increase RUE A/ROM by 15+ degrees to improve ability to use RUE during dressing tasks with minimal compensatory techniques.   Goal status: MET  3.  Pt will increase RUE strength to 3+/5 to improve ability to reach for items at waist to chest height during bathing and grooming tasks.   Goal status: IN PROGRESS  4.  Pt will demonstrate decreased edema by at least 25% to improve ability to perform joint mobility tasks and improve functional use of the RUE during grooming tasks.    Goal status: MET    LONG TERM GOALS: Target date: 10/09/23  Pt will decrease pain in RUE to 3/10 or less to improve ability to perform HEP at recommended frequency and duration to improve mobility of RUE required for ADL completion.   Goal status: MET  2.  Pt will decrease RUE fascial restrictions to min amounts or less to improve mobility required for functional reaching tasks.   Goal status: MET  3.  Pt will increase RUE A/ROM by 30+ degrees to improve ability to use  RUE when reaching overhead or behind back during dressing and bathing tasks.   Goal status: IN PROGRESS  4.  Pt will increase RUE strength to 4/5 or greater to improve ability to use RUE when lifting or carrying items during meal preparation/housework/yardwork tasks.   Goal status: IN PROGRESS   5.  Pt will increase RUE grip strength by 30# or greater to improve ability to use RUE when grasping and manipulating items for meal preparation or housework.   Goal status: MET    ASSESSMENT:  CLINICAL IMPRESSION: Pt reports MD appointment went well and MD is pleased with her progress. Pt has been cleaning her  house and using her arm a lot during ADLs. Continued with manual techniques and A/ROM, functional reaching tasks. Pt with improved tolerance to overhead lacing with minimal lean and no rest breaks required. Added small beads to hand gripper activity today, increased time to complete. Verbal and visual cuing for form and technique during tasks, rest breaks provided as needed. HEP updated for shoulder A/ROM.     PERFORMANCE DEFICITS: in functional skills including ADLs, IADLs, coordination, dexterity, proprioception, sensation, edema, ROM, strength, pain, fascial restrictions, muscle spasms, flexibility, UE functional use, and vestibular     PLAN:  OT FREQUENCY: 2x/week  OT DURATION: 4 weeks  PLANNED INTERVENTIONS: 97168 OT Re-evaluation, 97535 self care/ADL training, 95188 therapeutic exercise, 97530 therapeutic activity, 97112 neuromuscular re-education, 97140 manual therapy, 97035 ultrasound, 97018 paraffin, 41660 electrical stimulation unattended, patient/family education, and DME and/or AE instructions  CONSULTED AND AGREED WITH PLAN OF CARE: Patient  PLAN FOR NEXT SESSION: Follow up on MD appt, focus on end ROM and strengthening, functional reaching tasks   Lafonda Piety, OTR/L  [phone] 10/11/2023, 12:35 PM

## 2023-10-13 ENCOUNTER — Encounter (HOSPITAL_COMMUNITY): Admitting: Occupational Therapy

## 2023-10-18 ENCOUNTER — Encounter (HOSPITAL_COMMUNITY): Admitting: Occupational Therapy

## 2023-10-18 ENCOUNTER — Ambulatory Visit: Payer: Medicare Other | Admitting: Podiatry

## 2023-10-20 ENCOUNTER — Encounter: Payer: Self-pay | Admitting: Family Medicine

## 2023-10-20 ENCOUNTER — Ambulatory Visit (HOSPITAL_COMMUNITY): Admitting: Occupational Therapy

## 2023-10-20 ENCOUNTER — Encounter (HOSPITAL_COMMUNITY): Payer: Self-pay | Admitting: Occupational Therapy

## 2023-10-20 DIAGNOSIS — M25621 Stiffness of right elbow, not elsewhere classified: Secondary | ICD-10-CM

## 2023-10-20 DIAGNOSIS — R278 Other lack of coordination: Secondary | ICD-10-CM

## 2023-10-20 DIAGNOSIS — M25521 Pain in right elbow: Secondary | ICD-10-CM

## 2023-10-20 DIAGNOSIS — R29898 Other symptoms and signs involving the musculoskeletal system: Secondary | ICD-10-CM

## 2023-10-20 NOTE — Therapy (Signed)
  OUTPATIENT OCCUPATIONAL THERAPY ORTHO TREATMENT    Patient Name: Caitlyn Boone MRN: [id number] DOB:27-Sep-1940, 83 y.o., female Today's Date: 10/20/2023   Cancellation: Pt arrived for OT visit and reports she has a painful rash under her left breast that has spread to her side. Upon observation OT noting blistering with rash under breast and along dermatone. Pt has not seen MD yet, OT informing per policy pt must be cleared for shingles before returning. IF it is shingles pt is contagious during the blistering stage, once dried pt is clear to return.  Pt and daughter verbalized understanding.   Lafonda Piety, OTR/L  [phone] 10/20/23

## 2023-10-21 ENCOUNTER — Encounter: Payer: Self-pay | Admitting: Family Medicine

## 2023-10-21 ENCOUNTER — Ambulatory Visit (INDEPENDENT_AMBULATORY_CARE_PROVIDER_SITE_OTHER): Admitting: Family Medicine

## 2023-10-21 VITALS — BP 137/73 | HR 80 | Ht 63.0 in | Wt 188.0 lb

## 2023-10-21 DIAGNOSIS — B029 Zoster without complications: Secondary | ICD-10-CM

## 2023-10-21 DIAGNOSIS — B372 Candidiasis of skin and nail: Secondary | ICD-10-CM

## 2023-10-21 MED ORDER — NYSTATIN 100000 UNIT/GM EX CREA: 1.0000 | TOPICAL_CREAM | Freq: Two times a day (BID) | CUTANEOUS | 0 refills | Status: AC

## 2023-10-21 MED ORDER — VALACYCLOVIR HCL 1 G PO TABS: 1000.0000 mg | ORAL_TABLET | Freq: Two times a day (BID) | ORAL | 0 refills | Status: AC

## 2023-10-21 NOTE — Progress Notes (Signed)
 BP 137/73   Pulse 80   Ht 5\' 3"  (1.6 m)   Wt 188 lb (85.3 kg)   SpO2 96%   BMI 33.30 kg/m    Subjective:   Patient ID: Caitlyn Boone, female    DOB: Dec 19, 1940, 83 y.o.   MRN: [id number]  HPI: Caitlyn Boone is a 83 y.o. female presenting on 10/21/2023 for Rash (Left breast/flank- painful, red, itching, scabbing, blisters )   HPI Rash under left breast and then coming around to her back as well. Patient is coming in today with a rash under her left breast coming around to her back.  She says it started about 4 days ago started in the breast but then now its from around to her back.  She says it does itch but is not painful.  She had been using Neosporin and hydrocortisone on it and they did help a little bit.  Relevant past medical, surgical, family and social history reviewed and updated as indicated. Interim medical history since our last visit reviewed. Allergies and medications reviewed and updated.  Review of Systems  Constitutional:  Negative for chills and fever.  Eyes:  Negative for redness and visual disturbance.  Respiratory:  Negative for chest tightness and shortness of breath.   Cardiovascular:  Negative for chest pain and leg swelling.  Musculoskeletal:  Negative for back pain and gait problem.  Skin:  Negative for rash.  Neurological:  Negative for dizziness, light-headedness and headaches.  Psychiatric/Behavioral:  Negative for agitation and behavioral problems.   All other systems reviewed and are negative.   Per HPI unless specifically indicated above   Allergies as of 10/21/2023   No Known Allergies      Medication List        Accurate as of Oct 21, 2023 10:04 AM. If you have any questions, ask your nurse or doctor.          alendronate  70 MG tablet Commonly known as: FOSAMAX  Take 1 tablet (70 mg total) by mouth every 7 (seven) days. Take with a full glass of water on an empty stomach.   allopurinol  300 MG tablet Commonly known as:  ZYLOPRIM  Take 1 tablet by mouth once daily   amLODipine  5 MG tablet Commonly known as: NORVASC  Take 1 tablet (5 mg total) by mouth daily.   Calcium Citrate 1040 MG Tabs Take by mouth.   ciclopirox  8 % solution Commonly known as: PENLAC  Apply topically at bedtime. Apply over nail and surrounding skin. Apply daily over previous coat. After seven (7) days, may remove with alcohol and continue cycle.   DULoxetine  30 MG capsule Commonly known as: CYMBALTA  Take 1 capsule by mouth once daily   Fish Oil 1000 MG Caps Take 1 capsule by mouth.   furosemide  20 MG tablet Commonly known as: LASIX  Take 1 tablet (20 mg total) by mouth daily as needed.   hydrochlorothiazide  12.5 MG capsule Commonly known as: MICROZIDE  Take 1 capsule by mouth once daily   lisinopril  40 MG tablet Commonly known as: ZESTRIL  Take 1 tablet by mouth once daily   loratadine 10 MG tablet Commonly known as: CLARITIN Take 10 mg by mouth daily.   meclizine  25 MG tablet Commonly known as: ANTIVERT  Take 1 tablet (25 mg total) by mouth 3 (three) times daily as needed for dizziness.   multivitamin with minerals Tabs tablet Take 1 tablet by mouth daily.   nystatin cream Commonly known as: MYCOSTATIN Apply 1 Application topically 2 (two) times  daily for 14 days. Started by: Lucio Sabin Miliano Cotten   omeprazole  20 MG capsule Commonly known as: PRILOSEC Take 1 capsule by mouth once daily   pravastatin  80 MG tablet Commonly known as: PRAVACHOL  Take 1 tablet (80 mg total) by mouth every evening.   valACYclovir 1000 MG tablet Commonly known as: VALTREX Take 1 tablet (1,000 mg total) by mouth 2 (two) times daily for 10 days. Started by: Lucio Sabin Cecely Rengel   Vitamin D  50 MCG (2000 UT) Caps Take 1 capsule by mouth daily.         Objective:   BP 137/73   Pulse 80   Ht 5\' 3"  (1.6 m)   Wt 188 lb (85.3 kg)   SpO2 96%   BMI 33.30 kg/m   Wt Readings from Last 3 Encounters:  10/21/23 188 lb (85.3 kg)   10/11/23 189 lb (85.7 kg)  09/12/23 191 lb (86.6 kg)    Physical Exam Vitals and nursing note reviewed.  Constitutional:      General: She is not in acute distress.    Appearance: She is well-developed. She is not diaphoretic.  Eyes:     Conjunctiva/sclera: Conjunctivae normal.  Musculoskeletal:        General: No tenderness. Normal range of motion.  Skin:    General: Skin is warm and dry.     Findings: Rash (Vesicles on her mid back coming around up to the front, has dermatitis and pink raised lesions around the vesicles in the front) present.  Neurological:     Mental Status: She is alert and oriented to person, place, and time.     Coordination: Coordination normal.  Psychiatric:        Behavior: Behavior normal.       Assessment & Plan:   Problem List Items Addressed This Visit   None Visit Diagnoses       Herpes zoster without complication    -  Primary   Relevant Medications   valACYclovir (VALTREX) 1000 MG tablet   nystatin cream (MYCOSTATIN)     Yeast dermatitis       Relevant Medications   valACYclovir (VALTREX) 1000 MG tablet   nystatin cream (MYCOSTATIN)       Has shingles but looks like she started to develop some yeast dermatitis around it under the breast. Follow up plan: Return if symptoms worsen or fail to improve.  Counseling provided for all of the vaccine components No orders of the defined types were placed in this encounter.   Jolyne Needs, MD [facility] 10/21/2023, 10:04 AM

## 2023-10-25 ENCOUNTER — Encounter (HOSPITAL_COMMUNITY): Admitting: Occupational Therapy

## 2023-10-27 ENCOUNTER — Encounter (HOSPITAL_COMMUNITY): Payer: Self-pay | Admitting: Occupational Therapy

## 2023-10-27 ENCOUNTER — Ambulatory Visit (HOSPITAL_COMMUNITY): Admitting: Occupational Therapy

## 2023-10-27 DIAGNOSIS — R278 Other lack of coordination: Secondary | ICD-10-CM

## 2023-10-27 DIAGNOSIS — R29898 Other symptoms and signs involving the musculoskeletal system: Secondary | ICD-10-CM | POA: Diagnosis not present

## 2023-10-27 DIAGNOSIS — M25521 Pain in right elbow: Secondary | ICD-10-CM

## 2023-10-27 DIAGNOSIS — M25621 Stiffness of right elbow, not elsewhere classified: Secondary | ICD-10-CM

## 2023-10-27 NOTE — Therapy (Signed)
 OUTPATIENT OCCUPATIONAL THERAPY ORTHO TREATMENT    Patient Name: Caitlyn Boone MRN: [id number] DOB:1941-01-16, 83 y.o., female Today's Date: 10/27/2023    END OF SESSION:  OT End of Session - 10/27/23 1145     Visit Number 20    Number of Visits 26    Date for OT Re-Evaluation 11/05/23    Authorization Type [employer] Medicare, $20 copay    Authorization Time Period [facility] medicare approved 8 visits from 10/04/23-11/01/23    Authorization - Visit Number 3    Authorization - Number of Visits 8    Progress Note Due on Visit 20    OT Start Time 1056    OT Stop Time 1139    OT Time Calculation (min) 43 min    Activity Tolerance Patient tolerated treatment well    Behavior During Therapy WFL for tasks assessed/performed                   Past Medical History:  Diagnosis Date   Compression fracture 05/24/2013   L1   GERD (gastroesophageal reflux disease)    Gout    Hyperlipidemia    Hypertension    Knee fracture, left feb 2015   no surgery done   Osteoporosis    Past Surgical History:  Procedure Laterality Date   ABDOMINAL HYSTERECTOMY  1992   complete   CHOLECYSTECTOMY N/A 06/28/2014   Procedure: LAPAROSCOPIC CHOLECYSTECTOMY ;  Surgeon: Boyce Byes, MD;  Location: [facility];  Service: General;  Laterality: N/A;   EYE SURGERY Bilateral 2013   lens for cataracts   LAMINECTOMY  1987   L5-S1   TONSILLECTOMY  1961   Patient Active Problem List   Diagnosis Date Noted   Vertebral artery narrowing, right 10/11/2023   Anxiety 01/26/2023   Hypertriglyceridemia 08/18/2022   Elevated blood sugar 08/18/2022   Closed nondisplaced fracture of left patella 03/09/2018   Obesity (BMI 30.0-34.9) 02/16/2016   Gallstones 06/28/2014   Hyperlipidemia 02/19/2014   Osteoporosis 02/19/2014   Essential hypertension 02/19/2014   GERD (gastroesophageal reflux disease) 02/19/2014   Gout 02/19/2014   PCP: Dr. Melodie Spry Dettinger REFERRING PROVIDER: Wilfredo Hanly, PA-C  ONSET DATE:  07/21/23  REFERRING DIAG: s/p ORIF comminuted articular right distal humerus fracture  THERAPY DIAG:  Pain in right elbow  Stiffness of right elbow, not elsewhere classified  Other symptoms and signs involving the musculoskeletal system  Other lack of coordination  Rationale for Evaluation and Treatment: Rehabilitation  SUBJECTIVE:   SUBJECTIVE STATEMENT: S: "My scab finally came off."   PERTINENT HISTORY: Pt is an 83 y/o female s/p ORIF comminuted articular right distal humerus fracture on 07/21/23. Pt sustained injury when her foot caught on the threshold between 2 rooms and she fell. Pt presents in posterior long arm splint and sling today. Step-son present as caregiver.   PRECAUTIONS: Other: Strictly NWB; ok to begin A/ROM at 2 weeks, wear splint until follow up with MD at 6 weeks; follow [place]  Handbook Protocol   WEIGHT BEARING RESTRICTIONS: Yes NWB  PAIN:  Are you having pain? No  FALLS: Has patient fallen in last 6 months? Yes. Number of falls 1  PLOF: Independent  PATIENT GOALS: To be able to use the RUE.   NEXT MD VISIT: unsure  OBJECTIVE:  Note: Objective measures were completed at Evaluation unless otherwise noted.  HAND DOMINANCE: Right  ADLs: Pt is unable to use the dominant RUE for any ADLs, cannot reach overhead or behind back. Pt with min difficulty sleeping.  FUNCTIONAL OUTCOME MEASURES: Upper Extremity Functional Scale (UEFS): 16/80 10/06/23: 48/80  UPPER EXTREMITY ROM:       Eval: Assessed in sitting, unable to assess P/ROM due to protocol   08/26/23: Assessed in sitting, er/IR adducted   09/27/23: Assessed in sitting, er/IR adducted   10/06/23: Assessed in sitting, er/IR adducted  Active ROM Right eval Right 08/26/23 Right 09/27/23 Right 10/06/23  Shoulder flexion 69 76 111 111  Shoulder abduction 56 58 160 160  Shoulder internal rotation 90 90 90 90  Shoulder external rotation 19 44 45 55  Elbow flexion 115 104 114 115  Elbow extension 36  45 29 27  Wrist flexion 32 40 52 52  Wrist extension 28 58 58 58  Wrist ulnar deviation 40     Wrist radial deviation 10 22    Wrist pronation 90     Wrist supination 40 80 80 80  (Blank rows = not tested)   UPPER EXTREMITY MMT:       Eval: Assessed on observation due to precautions limiting formal MMT   08/26/23: Assessed seated, er/IR adducted   09/27/23: Assessed seated, er/IR adducted   10/06/23: Assessed seated, er/IR adducted  MMT Right eval Right 08/26/23 Right 09/27/23 Right 10/06/23  Shoulder flexion 3-/5 3-/5 3/5 3/5  Shoulder abduction 3-/5 3-/5 3/5 3/5  Shoulder internal rotation 3/5 3+/5 4/5 4/5  Shoulder external rotation 3-/5 3/5 3/5 3/5  Elbow flexion 3-/5 3+/5 4+/5 4+/5  Elbow extension 3-/5 3+/5 4/5 4+/5  Wrist flexion 3/5 4-/5 5/5   Wrist extension 3/5 4-/5 5/5   Wrist ulnar deviation 3/5 4-/5 4/5 4/5  Wrist radial deviation 3/5 4+/5 4+/5 4+/5  Wrist pronation 3/5 4-/5 4+/5 4+/5  Wrist supination 3-/5 4-/5 4/5 4+/5  (Blank rows = not tested)  HAND FUNCTION: Grip strength: Right: 5 lbs; Left: 40 lbs 08/26/23: Right: 18 lbs 09/27/23: Right: 25 lbs 10/06/23: Right: 32lbs  SENSATION: Tingling in fingers along ulnar nerve distribution  EDEMA:  Eval     Right:    Left:  Wrist     15.75cm  15.0cm Mid forearm(10cm from wrist)  16.5cm  16.0cm Elbow    27cm   24cm Mid upper arm (10cm from elbow) 30.5cm  27.5cm  08/26/23:      Right:      Wrist     15.25cm   Mid forearm(10cm from wrist)  16.5cm   Elbow    25.75cm    Mid upper arm (10cm from elbow) 28.5cm        OBSERVATIONS: moderate edema and fascial restrictions along RUE forearm and elbow, min edema in fingers and wrist   TREATMENT DATE:  10/27/23 -P/ROM: shoulder flexion, abduction; elbow flexion/extension 5 reps -A/ROM: supine-protraction, flexion, horizontal abduction, er, 12 reps -Functional reaching: pt standing at overhead cabinets, placing 10 cones on middle shelf in flexion, removing in  abduction -A/ROM: sitting-protraction, flexion, horizontal abduction, er, abduction, 12 reps -Wall wash: 1'  -Overhead lacing: seated, lacing from top down then reversing, verbal cuing for lean -Scapular theraband: red-row, extension, 10 reps -Hand gripper task: medium beads gripper horizontal at 35#, small beads at 35# with gripper horizontal, incorporating elbow flexion and extension into task -Elbow strengthening: bicep curl, hammer curl, 2#, 10 reps  10/11/23 -myofascial release to right volar elbow, upper arm, and trapezius regions to decrease pain and fascial restrictions and improve joint ROM -P/ROM: shoulder flexion, abduction; elbow flexion/extension 5 reps -A/ROM: sitting-protraction, flexion, horizontal abduction, er, abduction, 15 reps -Proximal  shoulder strengthening: sitting-paddles, criss cross, circles each direction, 12 reps -Functional reaching: pt standing at overhead cabinets, placing 8 cones on middle shelf in flexion, removing in abduction -Scapular theraband: red-row, extension, 10 reps -Bicep curl: red theraband, 10 reps -Overhead lacing: seated, lacing from top down then reversing, verbal cuing for lean -Hand gripper task: medium beads gripper vertical at 25#, small beads at 22# with gripper horizontal, incorporating elbow flexion and extension into task  10/06/23 -myofascial release to right volar elbow, upper arm, and trapezius regions to decrease pain and fascial restrictions and improve joint ROM -P/ROM: shoulder flexion, abduction; elbow flexion/extension 5 reps -A/ROM: sitting-protraction, flexion, horizontal abduction, er, abduction, 12 reps -Strengthening: 2#-hammer curl, bicep curl, 10 reps -Proximal shoulder strengthening: sitting-paddles, criss cross, circles each direction, 10 reps -Functional reaching: pt standing at overhead cabinets, placing 7 cones on middle shelf in flexion, removing in abduction -Hand gripper task: medium beads gripper vertical at 25#,  incorporating elbow flexion and extension into task -Pinch [place]: pt placing resistive clothespins along vertical bar      PATIENT EDUCATION: Education details: reviewed HEP Person educated: Patient and Caregiver step-son Education method: Explanation, Demonstration, and Handouts Education comprehension: verbalized understanding and returned demonstration  HOME EXERCISE PROGRAM: Eval: elbow and wrist A/ROM 08/18/23: Table slides 09/01/23: AA/ROM shoulder and elbow 09/13/23: Touching thumb to chin or nose 09/15/23: self-feeding with right hand 09/27/23: reaching into cabinets at home 10/11/23: Shoulder A/ROM   GOALS: Goals reviewed with patient? Yes  SHORT TERM GOALS: Target date: 09/09/23  Pt will be provided with and educated on HEP to improve mobility in RUE required for use during ADL completion.   Goal status: IN PROGRESS  2.  Pt will increase RUE A/ROM by 15+ degrees to improve ability to use RUE during dressing tasks with minimal compensatory techniques.   Goal status: MET  3.  Pt will increase RUE strength to 3+/5 to improve ability to reach for items at waist to chest height during bathing and grooming tasks.   Goal status: IN PROGRESS  4.  Pt will demonstrate decreased edema by at least 25% to improve ability to perform joint mobility tasks and improve functional use of the RUE during grooming tasks.    Goal status: MET    LONG TERM GOALS: Target date: 10/09/23  Pt will decrease pain in RUE to 3/10 or less to improve ability to perform HEP at recommended frequency and duration to improve mobility of RUE required for ADL completion.   Goal status: MET  2.  Pt will decrease RUE fascial restrictions to min amounts or less to improve mobility required for functional reaching tasks.   Goal status: MET  3.  Pt will increase RUE A/ROM by 30+ degrees to improve ability to use RUE when reaching overhead or behind back during dressing and bathing tasks.   Goal status: IN  PROGRESS  4.  Pt will increase RUE strength to 4/5 or greater to improve ability to use RUE when lifting or carrying items during meal preparation/housework/yardwork tasks.   Goal status: IN PROGRESS   5.  Pt will increase RUE grip strength by 30# or greater to improve ability to use RUE when grasping and manipulating items for meal preparation or housework.   Goal status: MET    ASSESSMENT:  CLINICAL IMPRESSION: Pt reports shingles are drying up and healing, no new blisters, she has been completing her HEP daily. She is using her RUE during ADLs, continues to have difficulty with reaching overhead.  Continued with A/ROM at beginning of session to warm-up, progressing to strengthening and functional reaching activities. Focus on posture and form, verbal cuing for speed throughout session. Pt able to increase hand gripper resistance to 35# today. Added 2# weight to elbow tasks for strengthening. Discussed upcoming reassessment and potential discharge, pt verbalized understanding.     PERFORMANCE DEFICITS: in functional skills including ADLs, IADLs, coordination, dexterity, proprioception, sensation, edema, ROM, strength, pain, fascial restrictions, muscle spasms, flexibility, UE functional use, and vestibular     PLAN:  OT FREQUENCY: 2x/week  OT DURATION: 4 weeks  PLANNED INTERVENTIONS: 97168 OT Re-evaluation, 97535 self care/ADL training, 16109 therapeutic exercise, 97530 therapeutic activity, 97112 neuromuscular re-education, 97140 manual therapy, 97035 ultrasound, 97018 paraffin, 60454 electrical stimulation unattended, patient/family education, and DME and/or AE instructions  CONSULTED AND AGREED WITH PLAN OF CARE: Patient  PLAN FOR NEXT SESSION: Focus on end ROM and strengthening, functional reaching tasks   Lafonda Piety, OTR/L  [phone] 10/27/2023, 11:46 AM

## 2023-11-01 ENCOUNTER — Encounter (HOSPITAL_COMMUNITY): Admitting: Occupational Therapy

## 2023-11-01 ENCOUNTER — Ambulatory Visit (HOSPITAL_COMMUNITY): Admitting: Occupational Therapy

## 2023-11-01 ENCOUNTER — Encounter (HOSPITAL_COMMUNITY): Payer: Self-pay | Admitting: Occupational Therapy

## 2023-11-01 DIAGNOSIS — M25621 Stiffness of right elbow, not elsewhere classified: Secondary | ICD-10-CM

## 2023-11-01 DIAGNOSIS — M25521 Pain in right elbow: Secondary | ICD-10-CM | POA: Diagnosis not present

## 2023-11-01 DIAGNOSIS — R278 Other lack of coordination: Secondary | ICD-10-CM

## 2023-11-01 DIAGNOSIS — R29898 Other symptoms and signs involving the musculoskeletal system: Secondary | ICD-10-CM | POA: Diagnosis not present

## 2023-11-01 NOTE — Patient Instructions (Signed)
 Therapy Ball Strengthening Exercises: Complete all exercises 10-15X each, 1x/day.   1) Overhead press: Hold a medicine ball or other ball at your chest. Next, extend your elbows and push the ball over your head as shown. Return to starting position and repeat.     2) Chest press: Hold a medicine ball or other ball at your chest. Next, extend your elbows and push the ball outward in front of your body as shown. Return to starting position and repeat.     3) Ball circles:  Hold a medicine ball or other ball at your chest. Next, extend your elbows and push the ball outward in front of your body as shown. Then, move the ball in a circular pattern for several revolutions and then reverse the direction.     4) Flexion: Start holding an exercise ball out in front of your body with elbows extended. Then, slowly lift the ball up over head. Return the ball to starting position and repeat.     5) Ball diagonals: Begin holding a ball with both hands and then move it through a diagonal pattern from your hip to over your opposite shoulder as shown.

## 2023-11-01 NOTE — Therapy (Signed)
 OUTPATIENT OCCUPATIONAL THERAPY ORTHO TREATMENT REASSESSMENT AND TREATMENT    Patient Name: Caitlyn Boone MRN: [id number] DOB:May 12, 1941, 83 y.o., female Today's Date: 11/01/2023  OCCUPATIONAL THERAPY DISCHARGE SUMMARY  Visits from Start of Care: 20  Current functional level related to goals / functional outcomes: See below. Pt has met all but one goal, remaining goal has been partially met. Pt is using her RUE as dominant during all daily tasks and is pleased with the current functional level.    Remaining deficits: Mild weakness in RUE   Education / Equipment: HEP for continued strengthening   Patient agrees to discharge. Patient goals were met. Patient is being discharged due to meeting the stated rehab goals..     END OF SESSION:  OT End of Session - 11/01/23 1502     Visit Number 21    Number of Visits 26    Date for OT Re-Evaluation 11/05/23    Authorization Type [employer] Medicare, $20 copay    Authorization Time Period [facility] medicare approved 8 visits from 10/04/23-11/01/23    Authorization - Visit Number 4    Authorization - Number of Visits 8    Progress Note Due on Visit 20    OT Start Time 1435    OT Stop Time 1510    OT Time Calculation (min) 35 min    Activity Tolerance Patient tolerated treatment well    Behavior During Therapy [facility] for tasks assessed/performed                    Past Medical History:  Diagnosis Date   Compression fracture 05/24/2013   L1   GERD (gastroesophageal reflux disease)    Gout    Hyperlipidemia    Hypertension    Knee fracture, left feb 2015   no surgery done   Osteoporosis    Past Surgical History:  Procedure Laterality Date   ABDOMINAL HYSTERECTOMY  1992   complete   CHOLECYSTECTOMY N/A 06/28/2014   Procedure: LAPAROSCOPIC CHOLECYSTECTOMY ;  Surgeon: Boyce Byes, MD;  Location: [facility];  Service: General;  Laterality: N/A;   EYE SURGERY Bilateral 2013   lens for cataracts   LAMINECTOMY  1987   L5-S1    TONSILLECTOMY  1961   Patient Active Problem List   Diagnosis Date Noted   Vertebral artery narrowing, right 10/11/2023   Anxiety 01/26/2023   Hypertriglyceridemia 08/18/2022   Elevated blood sugar 08/18/2022   Closed nondisplaced fracture of left patella 03/09/2018   Obesity (BMI 30.0-34.9) 02/16/2016   Gallstones 06/28/2014   Hyperlipidemia 02/19/2014   Osteoporosis 02/19/2014   Essential hypertension 02/19/2014   GERD (gastroesophageal reflux disease) 02/19/2014   Gout 02/19/2014   PCP: Dr. Melodie Spry Dettinger REFERRING PROVIDER: Wilfredo Hanly, PA-C  ONSET DATE: 07/21/23  REFERRING DIAG: s/p ORIF comminuted articular right distal humerus fracture  THERAPY DIAG:  Pain in right elbow  Stiffness of right elbow, not elsewhere classified  Other symptoms and signs involving the musculoskeletal system  Other lack of coordination  Rationale for Evaluation and Treatment: Rehabilitation  SUBJECTIVE:   SUBJECTIVE STATEMENT: S: "I've been using it a lot."   PERTINENT HISTORY: Pt is an 83 y/o female s/p ORIF comminuted articular right distal humerus fracture on 07/21/23. Pt sustained injury when her foot caught on the threshold between 2 rooms and she fell. Pt presents in posterior long arm splint and sling today. Step-son present as caregiver.   PRECAUTIONS: Other: Strictly NWB; ok to begin A/ROM at 2 weeks, wear splint  until follow up with MD at 6 weeks; follow [place]  Handbook Protocol   WEIGHT BEARING RESTRICTIONS: Yes NWB  PAIN:  Are you having pain? No  FALLS: Has patient fallen in last 6 months? Yes. Number of falls 1  PLOF: Independent  PATIENT GOALS: To be able to use the RUE.   NEXT MD VISIT: unsure  OBJECTIVE:  Note: Objective measures were completed at Evaluation unless otherwise noted.  HAND DOMINANCE: Right  ADLs: Pt is unable to use the dominant RUE for any ADLs, cannot reach overhead or behind back. Pt with min difficulty sleeping.    FUNCTIONAL OUTCOME  MEASURES: Upper Extremity Functional Scale (UEFS): 16/80 10/06/23: 48/80 11/01/23: 72/80=90%  UPPER EXTREMITY ROM:       Eval: Assessed in sitting, unable to assess P/ROM due to protocol   08/26/23: Assessed in sitting, er/IR adducted   09/27/23: Assessed in sitting, er/IR adducted   10/06/23: Assessed in sitting, er/IR adducted 11/01/23: Assessed seated, er/IR adducted  Active ROM Right eval Right 08/26/23 Right 09/27/23 Right 10/06/23 Right 11/01/23  Shoulder flexion 69 76 111 111 116  Shoulder abduction 56 58 160 160 160  Shoulder internal rotation 90 90 90 90 90  Shoulder external rotation 19 44 45 55 46  Elbow flexion 115 104 114 115 116  Elbow extension 36 45 29 27 24   Wrist flexion 32 40 52 52   Wrist extension 28 58 58 58   Wrist ulnar deviation 40      Wrist radial deviation 10 22     Wrist pronation 90      Wrist supination 40 80 80 80 90  (Blank rows = not tested)   UPPER EXTREMITY MMT:       Eval: Assessed on observation due to precautions limiting formal MMT   08/26/23: Assessed seated, er/IR adducted   09/27/23: Assessed seated, er/IR adducted   10/06/23: Assessed seated, er/IR adducted   11/01/23: Assessed seated, er/IR adducted  MMT Right eval Right 08/26/23 Right 09/27/23 Right 10/06/23 Right 11/01/23  Shoulder flexion 3-/5 3-/5 3/5 3/5 4-/5  Shoulder abduction 3-/5 3-/5 3/5 3/5 4/5  Shoulder internal rotation 3/5 3+/5 4/5 4/5 5/5  Shoulder external rotation 3-/5 3/5 3/5 3/5 4-/5  Elbow flexion 3-/5 3+/5 4+/5 4+/5 5/5  Elbow extension 3-/5 3+/5 4/5 4+/5 5/5  Wrist flexion 3/5 4-/5 5/5    Wrist extension 3/5 4-/5 5/5    Wrist ulnar deviation 3/5 4-/5 4/5 4/5 5/5  Wrist radial deviation 3/5 4+/5 4+/5 4+/5 5/5  Wrist pronation 3/5 4-/5 4+/5 4+/5 5/5  Wrist supination 3-/5 4-/5 4/5 4+/5 5/5  (Blank rows = not tested)  HAND FUNCTION: Grip strength: Right: 5 lbs; Left: 40 lbs 08/26/23: Right: 18 lbs 09/27/23: Right: 25 lbs 10/06/23: Right: 32lbs 11/01/23: Right:  37lbs  SENSATION: Tingling in fingers along ulnar nerve distribution  EDEMA:  Eval     Right:    Left:  Wrist     15.75cm  15.0cm Mid forearm(10cm from wrist)  16.5cm  16.0cm Elbow    27cm   24cm Mid upper arm (10cm from elbow) 30.5cm  27.5cm  08/26/23:      Right:      Wrist     15.25cm   Mid forearm(10cm from wrist)  16.5cm   Elbow    25.75cm    Mid upper arm (10cm from elbow) 28.5cm        OBSERVATIONS: moderate edema and fascial restrictions along RUE forearm and elbow, min edema  in fingers and wrist   TREATMENT DATE:  11/01/23 -Therapy ball strengthening: basketball-overhead press, chest press, circles each direction, flexion, PNF patterns -Overhead lacing: seated, lacing from top down then reversing, verbal cuing for lean -UBE: level 2, 2' forward, 2' reverse, pace: 8.0  10/27/23 -P/ROM: shoulder flexion, abduction; elbow flexion/extension 5 reps -A/ROM: supine-protraction, flexion, horizontal abduction, er, 12 reps -Functional reaching: pt standing at overhead cabinets, placing 10 cones on middle shelf in flexion, removing in abduction -A/ROM: sitting-protraction, flexion, horizontal abduction, er, abduction, 12 reps -Wall wash: 1'  -Overhead lacing: seated, lacing from top down then reversing, verbal cuing for lean -Scapular theraband: red-row, extension, 10 reps -Hand gripper task: medium beads gripper horizontal at 35#, small beads at 35# with gripper horizontal, incorporating elbow flexion and extension into task -Elbow strengthening: bicep curl, hammer curl, 2#, 10 reps  10/11/23 -myofascial release to right volar elbow, upper arm, and trapezius regions to decrease pain and fascial restrictions and improve joint ROM -P/ROM: shoulder flexion, abduction; elbow flexion/extension 5 reps -A/ROM: sitting-protraction, flexion, horizontal abduction, er, abduction, 15 reps -Proximal shoulder strengthening: sitting-paddles, criss cross, circles each direction, 12  reps -Functional reaching: pt standing at overhead cabinets, placing 8 cones on middle shelf in flexion, removing in abduction -Scapular theraband: red-row, extension, 10 reps -Bicep curl: red theraband, 10 reps -Overhead lacing: seated, lacing from top down then reversing, verbal cuing for lean -Hand gripper task: medium beads gripper vertical at 25#, small beads at 22# with gripper horizontal, incorporating elbow flexion and extension into task    PATIENT EDUCATION: Education details: reviewed HEP Person educated: Patient and Caregiver step-son Education method: Explanation, Demonstration, and Handouts Education comprehension: verbalized understanding and returned demonstration  HOME EXERCISE PROGRAM: Eval: elbow and wrist A/ROM 08/18/23: Table slides 09/01/23: AA/ROM shoulder and elbow 09/13/23: Touching thumb to chin or nose 09/15/23: self-feeding with right hand 09/27/23: reaching into cabinets at home 10/11/23: Shoulder A/ROM   GOALS: Goals reviewed with patient? Yes  SHORT TERM GOALS: Target date: 09/09/23  Pt will be provided with and educated on HEP to improve mobility in RUE required for use during ADL completion.   Goal status: MET  2.  Pt will increase RUE A/ROM by 15+ degrees to improve ability to use RUE during dressing tasks with minimal compensatory techniques.   Goal status: MET  3.  Pt will increase RUE strength to 3+/5 to improve ability to reach for items at waist to chest height during bathing and grooming tasks.   Goal status: MET  4.  Pt will demonstrate decreased edema by at least 25% to improve ability to perform joint mobility tasks and improve functional use of the RUE during grooming tasks.    Goal status: MET    LONG TERM GOALS: Target date: 10/09/23  Pt will decrease pain in RUE to 3/10 or less to improve ability to perform HEP at recommended frequency and duration to improve mobility of RUE required for ADL completion.   Goal status: MET  2.   Pt will decrease RUE fascial restrictions to min amounts or less to improve mobility required for functional reaching tasks.   Goal status: MET  3.  Pt will increase RUE A/ROM by 30+ degrees to improve ability to use RUE when reaching overhead or behind back during dressing and bathing tasks.   Goal status: MET  4.  Pt will increase RUE strength to 4/5 or greater to improve ability to use RUE when lifting or carrying items during meal preparation/housework/yardwork tasks.  Goal status: PARTIALLY MET   5.  Pt will increase RUE grip strength by 30# or greater to improve ability to use RUE when grasping and manipulating items for meal preparation or housework.   Goal status: MET    ASSESSMENT:  CLINICAL IMPRESSION: Reassessment completed this session, pt has met all STGs and 4/5 LTGs with remaining goal partially met. Pt reports she is using the RUE during all ADLs. She can now brush and floss her teeth, feed herself, wash and fold clothes, and do her cleaning. Pt feels she is using the RUE as her dominant arm and has little to no difficulty during ADLs. Reviewed HEP and updated HEP for strengthening. Pt is agreeable to discharge today.     PERFORMANCE DEFICITS: in functional skills including ADLs, IADLs, coordination, dexterity, proprioception, sensation, edema, ROM, strength, pain, fascial restrictions, muscle spasms, flexibility, UE functional use, and vestibular     PLAN:  OT FREQUENCY: 2x/week  OT DURATION: 4 weeks  PLANNED INTERVENTIONS: 97168 OT Re-evaluation, 97535 self care/ADL training, 40981 therapeutic exercise, 97530 therapeutic activity, 97112 neuromuscular re-education, 97140 manual therapy, 97035 ultrasound, 97018 paraffin, 19147 electrical stimulation unattended, patient/family education, and DME and/or AE instructions  CONSULTED AND AGREED WITH PLAN OF CARE: Patient  PLAN FOR NEXT SESSION: N/A-discharge today   Lafonda Piety, OTR/L   [phone] 11/01/2023, 3:12 PM

## 2023-11-03 ENCOUNTER — Encounter (HOSPITAL_COMMUNITY): Admitting: Occupational Therapy

## 2023-11-07 ENCOUNTER — Other Ambulatory Visit: Payer: Self-pay | Admitting: Family Medicine

## 2023-11-07 DIAGNOSIS — M1A9XX0 Chronic gout, unspecified, without tophus (tophi): Secondary | ICD-10-CM

## 2023-11-07 DIAGNOSIS — I10 Essential (primary) hypertension: Secondary | ICD-10-CM

## 2023-11-24 ENCOUNTER — Other Ambulatory Visit: Payer: Self-pay | Admitting: *Deleted

## 2023-11-24 MED ORDER — ALENDRONATE SODIUM 70 MG PO TABS: 70.0000 mg | ORAL_TABLET | ORAL | 0 refills | Status: DC

## 2023-12-02 DIAGNOSIS — S52041D Displaced fracture of coronoid process of right ulna, subsequent encounter for closed fracture with routine healing: Secondary | ICD-10-CM | POA: Diagnosis not present

## 2023-12-02 DIAGNOSIS — S42491D Other displaced fracture of lower end of right humerus, subsequent encounter for fracture with routine healing: Secondary | ICD-10-CM | POA: Diagnosis not present

## 2024-01-02 DIAGNOSIS — H35362 Drusen (degenerative) of macula, left eye: Secondary | ICD-10-CM | POA: Diagnosis not present

## 2024-01-02 DIAGNOSIS — H524 Presbyopia: Secondary | ICD-10-CM | POA: Diagnosis not present

## 2024-01-02 DIAGNOSIS — H40013 Open angle with borderline findings, low risk, bilateral: Secondary | ICD-10-CM | POA: Diagnosis not present

## 2024-01-02 DIAGNOSIS — H26491 Other secondary cataract, right eye: Secondary | ICD-10-CM | POA: Diagnosis not present

## 2024-01-02 DIAGNOSIS — H34231 Retinal artery branch occlusion, right eye: Secondary | ICD-10-CM | POA: Diagnosis not present

## 2024-01-26 ENCOUNTER — Other Ambulatory Visit: Payer: Self-pay | Admitting: Family Medicine

## 2024-01-26 DIAGNOSIS — M1A9XX0 Chronic gout, unspecified, without tophus (tophi): Secondary | ICD-10-CM

## 2024-01-26 DIAGNOSIS — I10 Essential (primary) hypertension: Secondary | ICD-10-CM

## 2024-01-28 ENCOUNTER — Other Ambulatory Visit: Payer: Self-pay | Admitting: Family Medicine

## 2024-01-28 DIAGNOSIS — E785 Hyperlipidemia, unspecified: Secondary | ICD-10-CM

## 2024-01-30 ENCOUNTER — Ambulatory Visit (INDEPENDENT_AMBULATORY_CARE_PROVIDER_SITE_OTHER): Payer: Medicare Other | Admitting: Family Medicine

## 2024-01-30 ENCOUNTER — Encounter: Payer: Self-pay | Admitting: Family Medicine

## 2024-01-30 VITALS — BP 123/74 | HR 71 | Temp 97.9°F | Ht 63.0 in | Wt 195.0 lb

## 2024-01-30 DIAGNOSIS — R42 Dizziness and giddiness: Secondary | ICD-10-CM | POA: Diagnosis not present

## 2024-01-30 DIAGNOSIS — K219 Gastro-esophageal reflux disease without esophagitis: Secondary | ICD-10-CM | POA: Diagnosis not present

## 2024-01-30 DIAGNOSIS — I10 Essential (primary) hypertension: Secondary | ICD-10-CM

## 2024-01-30 DIAGNOSIS — R058 Other specified cough: Secondary | ICD-10-CM

## 2024-01-30 DIAGNOSIS — F419 Anxiety disorder, unspecified: Secondary | ICD-10-CM | POA: Diagnosis not present

## 2024-01-30 DIAGNOSIS — M1A9XX0 Chronic gout, unspecified, without tophus (tophi): Secondary | ICD-10-CM

## 2024-01-30 DIAGNOSIS — Z9181 History of falling: Secondary | ICD-10-CM | POA: Diagnosis not present

## 2024-01-30 MED ORDER — AMLODIPINE BESYLATE 5 MG PO TABS: 5.0000 mg | ORAL_TABLET | Freq: Every day | ORAL | 3 refills | Status: DC

## 2024-01-30 MED ORDER — DULOXETINE HCL 30 MG PO CPEP: 30.0000 mg | ORAL_CAPSULE | Freq: Every day | ORAL | 1 refills | Status: DC

## 2024-01-30 MED ORDER — HYDROCHLOROTHIAZIDE 12.5 MG PO CAPS: 12.5000 mg | ORAL_CAPSULE | Freq: Every day | ORAL | 1 refills | Status: DC

## 2024-01-30 MED ORDER — LOSARTAN POTASSIUM 100 MG PO TABS: 100.0000 mg | ORAL_TABLET | Freq: Every day | ORAL | 3 refills | Status: DC

## 2024-01-30 MED ORDER — ALLOPURINOL 100 MG PO TABS: 100.0000 mg | ORAL_TABLET | Freq: Every day | ORAL | 3 refills | Status: DC

## 2024-01-30 MED ORDER — OMEPRAZOLE 20 MG PO CPDR: 20.0000 mg | DELAYED_RELEASE_CAPSULE | Freq: Every day | ORAL | 1 refills | Status: DC

## 2024-01-30 MED ORDER — MECLIZINE HCL 25 MG PO TABS: 25.0000 mg | ORAL_TABLET | Freq: Three times a day (TID) | ORAL | 3 refills | Status: DC | PRN

## 2024-01-30 NOTE — Progress Notes (Signed)
 BP 123/74   Pulse 71   Temp 97.9 F (36.6 C)   Ht 5' 3 (1.6 m)   Wt 195 lb (88.5 kg)   SpO2 93%   BMI 34.54 kg/m    Subjective:   Patient ID: Caitlyn Boone, female    DOB: [birth year], 83 y.o.   MRN: [id number]  HPI: Caitlyn Boone is a 83 y.o. female presenting on 01/30/2024 for Medical Management of Chronic Issues, Hyperlipidemia, Hypertension, and Cough (Persistent. Non productive)   Discussed the use of AI scribe software for clinical note transcription with the patient, who gave verbal consent to proceed.  History of Present Illness   Caitlyn Boone is an 83 year old female with hypertension and hyperlipidemia who presents for a recheck. She is accompanied by her daughter.  She experiences dizziness, particularly in the mornings upon standing, described as a sensation of everything moving slightly. Meclizine  is used for vertigo, which helps manage her symptoms. No faintness or sensation of passing out accompanies the dizziness.  She has a dry cough, particularly at night, and has been on lisinopril  for years. This cough has developed more recently.  For hyperlipidemia, she is managing with pravastatin  and fish oils. She is currently taking one fish oil capsule daily, having previously taken two. She is awaiting her cholesterol levels to determine if any adjustments are needed.  She experiences significant ringing and itching in her ears, describing the itching as severe. There is no mention of any infection or irritation.  She is taking duloxetine  (Cymbalta ) for mood stabilization and pain management, but she reports it does not help with mood or pain.  She has been feeling unsteady on her feet, particularly in open spaces, and has been using a cane. However, she finds it insufficient for stability, especially in places like grocery stores. She has had knee issues since an injury in June, which has made mobility more challenging. She expresses a need for a rolling walker with a  seat to aid her mobility.  She mentions occasional urgency with urination, sometimes not making it to the bathroom in time.          Relevant past medical, surgical, family and social history reviewed and updated as indicated. Interim medical history since our last visit reviewed. Allergies and medications reviewed and updated.  Review of Systems  Constitutional:  Negative for chills and fever.  HENT:  Negative for congestion, ear discharge and ear pain.   Eyes:  Negative for redness and visual disturbance.  Respiratory:  Negative for chest tightness and shortness of breath.   Cardiovascular:  Negative for chest pain and leg swelling.  Genitourinary:  Negative for difficulty urinating and dysuria.  Musculoskeletal:  Positive for arthralgias and gait problem. Negative for back pain.  Skin:  Negative for rash.  Neurological:  Positive for weakness. Negative for light-headedness and headaches.  Psychiatric/Behavioral:  Negative for agitation and behavioral problems.   All other systems reviewed and are negative.   Per HPI unless specifically indicated above   Allergies as of 01/30/2024   No Known Allergies      Medication List        Accurate as of January 30, 2024  3:20 PM. If you have any questions, ask your nurse or doctor.          STOP taking these medications    furosemide  20 MG tablet Commonly known as: LASIX  Stopped by: Fonda LABOR Cortina Vultaggio   lisinopril  40 MG tablet Commonly known  as: ZESTRIL  Stopped by: Fonda LABOR Dayonna Selbe       TAKE these medications    alendronate  70 MG tablet Commonly known as: FOSAMAX  Take 1 tablet (70 mg total) by mouth every 7 (seven) days. Take with a full glass of water on an empty stomach.   allopurinol  100 MG tablet Commonly known as: ZYLOPRIM  Take 1 tablet (100 mg total) by mouth daily. What changed:  medication strength how much to take Changed by: Fonda LABOR Benecio Kluger   amLODipine  5 MG tablet Commonly known as:  NORVASC  Take 1 tablet (5 mg total) by mouth daily.   Calcium Citrate 1040 MG Tabs Take by mouth.   ciclopirox  8 % solution Commonly known as: PENLAC  Apply topically at bedtime. Apply over nail and surrounding skin. Apply daily over previous coat. After seven (7) days, may remove with alcohol and continue cycle.   DULoxetine  30 MG capsule Commonly known as: CYMBALTA  Take 1 capsule (30 mg total) by mouth daily.   Fish Oil 1000 MG Caps Take 1 capsule by mouth.   hydrochlorothiazide  12.5 MG capsule Commonly known as: MICROZIDE  Take 1 capsule (12.5 mg total) by mouth daily.   loratadine 10 MG tablet Commonly known as: CLARITIN Take 10 mg by mouth daily.   losartan  100 MG tablet Commonly known as: COZAAR  Take 1 tablet (100 mg total) by mouth daily. Started by: Fonda LABOR Emberly Tomasso   meclizine  25 MG tablet Commonly known as: ANTIVERT  Take 1 tablet (25 mg total) by mouth 3 (three) times daily as needed for dizziness.   multivitamin with minerals Tabs tablet Take 1 tablet by mouth daily.   omeprazole  20 MG capsule Commonly known as: PRILOSEC Take 1 capsule (20 mg total) by mouth daily.   pravastatin  80 MG tablet Commonly known as: PRAVACHOL  Take 1 tablet by mouth in the evening   Vitamin D  50 MCG (2000 UT) Caps Take 1 capsule by mouth daily.               Durable Medical Equipment  (From admission, onward)           Start     Ordered   01/30/24 0000  For home use only DME 4 wheeled rolling walker with seat ([id number])       Comments: Diagnosis lower extremity weakness and high risk for falls and unsteadiness and vertigo Rolling walker with 4 wheels and a seat  Question:  Patient needs a walker to treat with the following condition  Answer:  At high risk for injury related to fall   01/30/24 1519             Objective:   BP 123/74   Pulse 71   Temp 97.9 F (36.6 C)   Ht 5' 3 (1.6 m)   Wt 195 lb (88.5 kg)   SpO2 93%   BMI 34.54 kg/m   Wt  Readings from Last 3 Encounters:  01/30/24 195 lb (88.5 kg)  10/21/23 188 lb (85.3 kg)  10/11/23 189 lb (85.7 kg)    Physical Exam Physical Exam   VITALS: BP- 123/74 HEENT: Ears normal, no infection or irritation. Throat normal. NECK: Thyroid normal. CHEST: Lungs clear to auscultation. CARDIOVASCULAR: Heart regular rate and rhythm, no murmurs.       Results for orders placed or performed in visit on 08/01/23  CBC with Differential/Platelet   Collection Time: 08/01/23  3:38 PM  Result Value Ref Range   WBC 11.1 (H) 3.4 - 10.8 x10E3/uL   RBC  4.17 3.77 - 5.28 x10E6/uL   Hemoglobin 13.1 11.1 - 15.9 g/dL   Hematocrit 60.6 65.9 - 46.6 %   MCV 94 79 - 97 fL   MCH 31.4 26.6 - 33.0 pg   MCHC 33.3 31.5 - 35.7 g/dL   RDW 85.9 88.2 - 84.5 %   Platelets 549 (H) 150 - 450 x10E3/uL   Neutrophils 70 Not Estab. %   Lymphs 19 Not Estab. %   Monocytes 7 Not Estab. %   Eos 3 Not Estab. %   Basos 1 Not Estab. %   Neutrophils Absolute 7.8 (H) 1.4 - 7.0 x10E3/uL   Lymphocytes Absolute 2.1 0.7 - 3.1 x10E3/uL   Monocytes Absolute 0.8 0.1 - 0.9 x10E3/uL   EOS (ABSOLUTE) 0.3 0.0 - 0.4 x10E3/uL   Basophils Absolute 0.1 0.0 - 0.2 x10E3/uL   Immature Granulocytes 0 Not Estab. %   Immature Grans (Abs) 0.0 0.0 - 0.1 x10E3/uL  CMP14+EGFR   Collection Time: 08/01/23  3:38 PM  Result Value Ref Range   Glucose 91 70 - 99 mg/dL   BUN 16 8 - 27 mg/dL   Creatinine, Ser 9.17 0.57 - 1.00 mg/dL   eGFR 71 >40 fO/fpw/8.26   BUN/Creatinine Ratio 20 12 - 28   Sodium 139 134 - 144 mmol/L   Potassium 4.6 3.5 - 5.2 mmol/L   Chloride 99 96 - 106 mmol/L   CO2 26 20 - 29 mmol/L   Calcium 10.7 (H) 8.7 - 10.3 mg/dL   Total Protein 7.6 6.0 - 8.5 g/dL   Albumin 4.2 3.7 - 4.7 g/dL   Globulin, Total 3.4 1.5 - 4.5 g/dL   Bilirubin Total <9.7 0.0 - 1.2 mg/dL   Alkaline Phosphatase 113 44 - 121 IU/L   AST 57 (H) 0 - 40 IU/L   ALT 39 (H) 0 - 32 IU/L  Lipid panel   Collection Time: 08/01/23  3:38 PM  Result Value Ref  Range   Cholesterol, Total 192 100 - 199 mg/dL   Triglycerides 836 (H) 0 - 149 mg/dL   HDL 47 >60 mg/dL   VLDL Cholesterol Cal 29 5 - 40 mg/dL   LDL Chol Calc (NIH) 883 (H) 0 - 99 mg/dL   Chol/HDL Ratio 4.1 0.0 - 4.4 ratio  Magnesium   Collection Time: 08/01/23  3:38 PM  Result Value Ref Range   Magnesium 2.0 1.6 - 2.3 mg/dL    Assessment & Plan:   Problem List Items Addressed This Visit       Cardiovascular and Mediastinum   Essential hypertension - Primary   Relevant Medications   amLODipine  (NORVASC ) 5 MG tablet   hydrochlorothiazide  (MICROZIDE ) 12.5 MG capsule   losartan  (COZAAR ) 100 MG tablet   Other Relevant Orders   CBC with Differential/Platelet   CMP14+EGFR   Lipid panel     Digestive   GERD (gastroesophageal reflux disease)   Relevant Medications   meclizine  (ANTIVERT ) 25 MG tablet   omeprazole  (PRILOSEC) 20 MG capsule   Other Relevant Orders   CBC with Differential/Platelet   CMP14+EGFR   Lipid panel     Other   Gout   Relevant Medications   allopurinol  (ZYLOPRIM ) 100 MG tablet   Anxiety   Relevant Medications   DULoxetine  (CYMBALTA ) 30 MG capsule   Other Visit Diagnoses       Vertigo       Relevant Medications   meclizine  (ANTIVERT ) 25 MG tablet   Other Relevant Orders   For home use  only DME 4 wheeled rolling walker with seat ([id number])     Dry cough         At high risk for falls       Relevant Orders   For home use only DME 4 wheeled rolling walker with seat ([id number])           Hypertension Blood pressure controlled at 123/74 mmHg. Lisinopril  may cause dry cough. - Switch lisinopril  to losartan . - Monitor blood pressure over the next few weeks.  Hyperlipidemia Currently on pravastatin  and fish oils. Fish oil dosage adjustment unclear. - Check lipid levels to determine if fish oil dosage needs adjustment.  Vertigo Dizziness upon waking managed effectively with meclizine .  Tinnitus and pruritus of ear canal Ringing and itching  likely due to dry skin, no infection or irritation observed.  Dry cough, likely medication-induced Dry cough, especially at night, possibly due to lisinopril . Consider allergies and environmental factors. - Switch lisinopril  to losartan . - Consider environmental factors such as humidity and allergens.  Gait instability Unsteadiness in open spaces, cane insufficient. Prefers not to use motorized carts. - Provide prescription for a rolling walker with a seat.  Urinary incontinence Urgency and occasional incontinence. Rolling walker may assist in reaching bathroom. - Provide prescription for a rolling walker with a seat.  Knee pain, history of injury Knee injury in June causing movement difficulty, managed with topical treatments.  Depression Stable mood on duloxetine  for mood stabilization and pain management.  Follow-Up Discussion of follow-up plans for prescriptions and equipment. - Print prescription for rolling walker for patient to take to preferred supplier.          Follow up plan: Return in about 6 months (around 08/01/2024), or if symptoms worsen or fail to improve, for Hypertension and anxiety and gout.  Counseling provided for all of the vaccine components Orders Placed This Encounter  Procedures   For home use only DME 4 wheeled rolling walker with seat ([id number])   CBC with Differential/Platelet   CMP14+EGFR   Lipid panel    Fonda Levins, MD [facility] 01/30/2024, 3:20 PM

## 2024-01-31 NOTE — Addendum Note (Signed)
 Addended by: LEIGH ROSINA SAILOR on: 01/31/2024 08:48 AM   Modules accepted: Orders

## 2024-01-31 NOTE — Addendum Note (Signed)
 Addended by: LEIGH ROSINA SAILOR on: 01/31/2024 08:12 AM   Modules accepted: Orders

## 2024-02-01 ENCOUNTER — Other Ambulatory Visit

## 2024-02-01 DIAGNOSIS — I10 Essential (primary) hypertension: Secondary | ICD-10-CM | POA: Diagnosis not present

## 2024-02-01 LAB — LIPID PANEL

## 2024-02-02 LAB — CBC WITH DIFFERENTIAL/PLATELET
Basophils Absolute: 0.1 x10E3/uL (ref 0.0–0.2)
Basos: 1 %
EOS (ABSOLUTE): 0.1 x10E3/uL (ref 0.0–0.4)
Eos: 1 %
Hematocrit: 43.6 % (ref 34.0–46.6)
Hemoglobin: 14.5 g/dL (ref 11.1–15.9)
Immature Grans (Abs): 0 x10E3/uL (ref 0.0–0.1)
Immature Granulocytes: 0 %
Lymphocytes Absolute: 2.4 x10E3/uL (ref 0.7–3.1)
Lymphs: 36 %
MCH: 31.3 pg (ref 26.6–33.0)
MCHC: 33.3 g/dL (ref 31.5–35.7)
MCV: 94 fL (ref 79–97)
Monocytes Absolute: 0.6 x10E3/uL (ref 0.1–0.9)
Monocytes: 9 %
Neutrophils Absolute: 3.5 x10E3/uL (ref 1.4–7.0)
Neutrophils: 53 %
Platelets: 336 x10E3/uL (ref 150–450)
RBC: 4.64 x10E6/uL (ref 3.77–5.28)
RDW: 13.2 % (ref 11.7–15.4)
WBC: 6.7 x10E3/uL (ref 3.4–10.8)

## 2024-02-02 LAB — LIPID PANEL
Cholesterol, Total: 190 mg/dL (ref 100–199)
HDL: 48 mg/dL (ref >39)
LDL CALC COMMENT:: 4 ratio (ref 0.0–4.4)
LDL Chol Calc (NIH): 104 mg/dL — AB (ref 0–99)
Triglycerides: 224 mg/dL — AB (ref 0–149)
VLDL Cholesterol Cal: 38 mg/dL (ref 5–40)

## 2024-02-02 LAB — CMP14+EGFR
ALT: 32 IU/L (ref 0–32)
AST: 41 IU/L — AB (ref 0–40)
Albumin: 4.1 g/dL (ref 3.7–4.7)
Alkaline Phosphatase: 80 IU/L (ref 44–121)
BUN/Creatinine Ratio: 18 (ref 12–28)
BUN: 15 mg/dL (ref 8–27)
Bilirubin Total: <0.2 mg/dL (ref 0.0–1.2)
CO2: 25 mmol/L (ref 20–29)
Calcium: 10.7 mg/dL — AB (ref 8.7–10.3)
Chloride: 97 mmol/L (ref 96–106)
Creatinine, Ser: 0.84 mg/dL (ref 0.57–1.00)
Globulin, Total: 3.6 g/dL (ref 1.5–4.5)
Glucose: 93 mg/dL (ref 70–99)
Potassium: 4.2 mmol/L (ref 3.5–5.2)
Sodium: 137 mmol/L (ref 134–144)
Total Protein: 7.7 g/dL (ref 6.0–8.5)
eGFR: 68 mL/min/1.73 (ref >59)

## 2024-02-09 ENCOUNTER — Ambulatory Visit: Payer: Self-pay | Admitting: Family Medicine

## 2024-02-13 ENCOUNTER — Other Ambulatory Visit: Payer: Self-pay

## 2024-02-17 DIAGNOSIS — Z09 Encounter for follow-up examination after completed treatment for conditions other than malignant neoplasm: Secondary | ICD-10-CM | POA: Diagnosis not present

## 2024-02-17 DIAGNOSIS — S42491D Other displaced fracture of lower end of right humerus, subsequent encounter for fracture with routine healing: Secondary | ICD-10-CM | POA: Diagnosis not present

## 2024-02-27 ENCOUNTER — Other Ambulatory Visit: Payer: Self-pay | Admitting: Family Medicine

## 2024-02-27 DIAGNOSIS — E785 Hyperlipidemia, unspecified: Secondary | ICD-10-CM

## 2024-02-27 MED ORDER — ALENDRONATE SODIUM 70 MG PO TABS: 70.0000 mg | ORAL_TABLET | ORAL | 1 refills | Status: DC

## 2024-03-23 ENCOUNTER — Ambulatory Visit (INDEPENDENT_AMBULATORY_CARE_PROVIDER_SITE_OTHER): Admitting: *Deleted

## 2024-03-23 DIAGNOSIS — Z23 Encounter for immunization: Secondary | ICD-10-CM

## 2024-03-29 ENCOUNTER — Ambulatory Visit

## 2024-04-06 ENCOUNTER — Other Ambulatory Visit: Payer: Self-pay | Admitting: *Deleted

## 2024-04-06 DIAGNOSIS — F419 Anxiety disorder, unspecified: Secondary | ICD-10-CM

## 2024-04-06 DIAGNOSIS — K219 Gastro-esophageal reflux disease without esophagitis: Secondary | ICD-10-CM

## 2024-04-06 DIAGNOSIS — I10 Essential (primary) hypertension: Secondary | ICD-10-CM

## 2024-04-06 DIAGNOSIS — R42 Dizziness and giddiness: Secondary | ICD-10-CM

## 2024-04-06 DIAGNOSIS — M1A9XX0 Chronic gout, unspecified, without tophus (tophi): Secondary | ICD-10-CM

## 2024-04-06 MED ORDER — HYDROCHLOROTHIAZIDE 12.5 MG PO CAPS: 12.5000 mg | ORAL_CAPSULE | Freq: Every day | ORAL | 0 refills | Status: AC

## 2024-04-06 MED ORDER — ALENDRONATE SODIUM 70 MG PO TABS: 70.0000 mg | ORAL_TABLET | ORAL | 0 refills | Status: DC

## 2024-04-06 MED ORDER — OMEPRAZOLE 20 MG PO CPDR: 20.0000 mg | DELAYED_RELEASE_CAPSULE | Freq: Every day | ORAL | 0 refills | Status: AC

## 2024-04-06 MED ORDER — MECLIZINE HCL 25 MG PO TABS: 25.0000 mg | ORAL_TABLET | Freq: Three times a day (TID) | ORAL | 2 refills | Status: AC | PRN

## 2024-04-06 MED ORDER — LOSARTAN POTASSIUM 100 MG PO TABS: 100.0000 mg | ORAL_TABLET | Freq: Every day | ORAL | 2 refills | Status: AC

## 2024-04-06 MED ORDER — ALLOPURINOL 100 MG PO TABS: 100.0000 mg | ORAL_TABLET | Freq: Every day | ORAL | 2 refills | Status: AC

## 2024-04-06 MED ORDER — AMLODIPINE BESYLATE 5 MG PO TABS: 5.0000 mg | ORAL_TABLET | Freq: Every day | ORAL | 2 refills | Status: AC

## 2024-04-06 MED ORDER — DULOXETINE HCL 30 MG PO CPEP
30.0000 mg | ORAL_CAPSULE | Freq: Every day | ORAL | 0 refills | Status: AC
Start: 2024-04-06 — End: ?

## 2024-04-06 NOTE — Telephone Encounter (Signed)
 Remaining refills from 01/30/24 sent to mail order

## 2024-05-02 ENCOUNTER — Other Ambulatory Visit: Payer: Self-pay | Admitting: Family Medicine

## 2024-05-02 DIAGNOSIS — Z1231 Encounter for screening mammogram for malignant neoplasm of breast: Secondary | ICD-10-CM

## 2024-05-07 ENCOUNTER — Ambulatory Visit: Admission: RE | Admit: 2024-05-07 | Discharge: 2024-05-07 | Disposition: A | Source: Ambulatory Visit

## 2024-05-07 DIAGNOSIS — Z1231 Encounter for screening mammogram for malignant neoplasm of breast: Secondary | ICD-10-CM

## 2024-05-15 ENCOUNTER — Ambulatory Visit: Payer: Self-pay | Admitting: Family Medicine

## 2024-05-15 ENCOUNTER — Ambulatory Visit: Payer: Self-pay

## 2024-05-15 ENCOUNTER — Ambulatory Visit (INDEPENDENT_AMBULATORY_CARE_PROVIDER_SITE_OTHER)

## 2024-05-15 ENCOUNTER — Ambulatory Visit: Admitting: Family Medicine

## 2024-05-15 VITALS — BP 175/83 | HR 64 | Temp 97.0°F | Ht 63.0 in | Wt 200.6 lb

## 2024-05-15 DIAGNOSIS — K5900 Constipation, unspecified: Secondary | ICD-10-CM

## 2024-05-15 DIAGNOSIS — R058 Other specified cough: Secondary | ICD-10-CM

## 2024-05-15 DIAGNOSIS — R10A2 Flank pain, left side: Secondary | ICD-10-CM

## 2024-05-15 DIAGNOSIS — R059 Cough, unspecified: Secondary | ICD-10-CM

## 2024-05-15 LAB — MICROSCOPIC EXAMINATION
Bacteria, UA: NONE SEEN
RBC, Urine: NONE SEEN /HPF (ref 0–2)
Renal Epithel, UA: NONE SEEN /HPF
Yeast, UA: NONE SEEN

## 2024-05-15 LAB — URINALYSIS, ROUTINE W REFLEX MICROSCOPIC
Bilirubin, UA: NEGATIVE
Glucose, UA: NEGATIVE
Ketones, UA: NEGATIVE
Nitrite, UA: NEGATIVE
Protein,UA: NEGATIVE
RBC, UA: NEGATIVE
Specific Gravity, UA: 1.015 (ref 1.005–1.030)
Urobilinogen, Ur: 0.2 mg/dL (ref 0.2–1.0)
pH, UA: 7 (ref 5.0–7.5)

## 2024-05-15 MED ORDER — POLYETHYLENE GLYCOL 3350 17 GM/SCOOP PO POWD: 17.0000 g | Freq: Every day | ORAL | 1 refills | Status: AC

## 2024-05-15 NOTE — Progress Notes (Signed)
 Subjective:  Patient ID: Caitlyn Boone, female    DOB: [birth year], 83 y.o.   MRN: [id number]  Patient Care Team: Dettinger, Fonda LABOR, MD as PCP - General (Family Medicine) Teressa Toribio SQUIBB, MD (Inactive) as Attending Physician (Gastroenterology) Cleatus Collar, MD as Consulting Physician (Ophthalmology)   Chief Complaint:  Flank Pain (Left sided flank pain x 3 days ) and Cough (X 3 days- also states that when she coughs it makes her right shoulder hurt )   HPI: Caitlyn Boone is a 83 y.o. female presenting on 05/15/2024 for Flank Pain (Left sided flank pain x 3 days ) and Cough (X 3 days- also states that when she coughs it makes her right shoulder hurt )   Caitlyn Boone is an 83 year old female who presents with left flank pain and increased urinary frequency.  She has been experiencing severe left flank pain for about one to two weeks, which radiates to her shoulder blades, especially during breathing. There is no history of recent falls or injuries, although she did fall in February and broke her elbow.  She notes increased urinary frequency without dysuria or hematuria. She is on hydrochlorothiazide  12.5 mg.  She reports a cough with congestion and white phlegm production. No fever, chills, or shortness of breath. Her lisinopril  was recently changed to address a nighttime cough, which has since resolved.  She has a history of constipation, characterized by hard stools requiring a plunger to pass, followed by loose stools. She takes a stool softener twice daily but continues to experience constipation. No nausea, vomiting, or blood in her stool, and her eating habits are normal without early satiety. Her past medical history includes gastric bypass surgery, which she mentions in relation to her bowel habits.          Relevant past medical, surgical, family, and social history reviewed and updated as indicated.  Allergies and medications reviewed and updated. Data reviewed: Chart  in Epic.   Past Medical History:  Diagnosis Date   Compression fracture 05/24/2013   L1   GERD (gastroesophageal reflux disease)    Gout    Hyperlipidemia    Hypertension    Knee fracture, left feb 2015   no surgery done   Osteoporosis     Past Surgical History:  Procedure Laterality Date   ABDOMINAL HYSTERECTOMY  1992   complete   CHOLECYSTECTOMY N/A 06/28/2014   Procedure: LAPAROSCOPIC CHOLECYSTECTOMY ;  Surgeon: Elon Pacini, MD;  Location: [facility];  Service: General;  Laterality: N/A;   EYE SURGERY Bilateral 2013   lens for cataracts   LAMINECTOMY  1987   L5-S1   TONSILLECTOMY  1961    Social History   Socioeconomic History   Marital status: Married    Spouse name: Not on file   Number of children: 0   Years of education: 12   Highest education level: 12th grade  Occupational History   Occupation: Retired  Tobacco Use   Smoking status: Former    Current packs/day: 0.00    Types: Cigarettes    Quit date: 02/24/1979    Years since quitting: 45.2   Smokeless tobacco: Never   Tobacco comments:    smoked 2-3 cig a day when smoking, quit long ago  Vaping Use   Vaping status: Never Used  Substance and Sexual Activity   Alcohol use: No    Alcohol/week: 0.0 standard drinks of alcohol   Drug use: No   Sexual activity:  Not Currently  Other Topics Concern   Not on file  Social History Narrative   Has 3 step-children - one passed away   Ex son-in-law lives nearby and checks on her often   Social Drivers of Health   Financial Resource Strain: Low Risk  (05/15/2024)   Overall Financial Resource Strain (CARDIA)    Difficulty of Paying Living Expenses: Not very hard  Food Insecurity: No Food Insecurity (05/15/2024)   Hunger Vital Sign    Worried About Running Out of Food in the Last Year: Never true    Ran Out of Food in the Last Year: Never true  Transportation Needs: Unknown (05/15/2024)   PRAPARE - Administrator, Civil Service (Medical): Not on  file    Lack of Transportation (Non-Medical): No  Physical Activity: Inactive (05/15/2024)   Exercise Vital Sign    Days of Exercise per Week: 0 days    Minutes of Exercise per Session: Not on file  Stress: Stress Concern Present (05/15/2024)   [employer] of Occupational Health - Occupational Stress Questionnaire    Feeling of Stress: To some extent  Social Connections: Socially Integrated (05/15/2024)   Social Connection and Isolation Panel    Frequency of Communication with Friends and Family: More than three times a week    Frequency of Social Gatherings with Friends and Family: More than three times a week    Attends Religious Services: More than 4 times per year    Active Member of [employer] or Organizations: Yes    Attends Engineer, Structural: More than 4 times per year    Marital Status: Married  Catering Manager Violence: Not At Risk (09/12/2023)   Humiliation, Afraid, Rape, and Kick questionnaire    Fear of Current or Ex-Partner: No    Emotionally Abused: No    Physically Abused: No    Sexually Abused: No    Outpatient Encounter Medications as of 05/15/2024  Medication Sig   alendronate  (FOSAMAX ) 70 MG tablet Take 1 tablet (70 mg total) by mouth every 7 (seven) days. Take with a full glass of water on an empty stomach.   allopurinol  (ZYLOPRIM ) 100 MG tablet Take 1 tablet (100 mg total) by mouth daily.   amLODipine  (NORVASC ) 5 MG tablet Take 1 tablet (5 mg total) by mouth daily.   ascorbic acid (VITAMIN C) 500 MG tablet Take 500 mg by mouth every morning.   aspirin EC 81 MG tablet Take 81 mg by mouth in the morning. Swallow whole.   Calcium Citrate 1040 MG TABS Take by mouth. (Patient taking differently: Take 1 tablet by mouth daily after lunch.)   Cholecalciferol (VITAMIN D ) 2000 UNITS CAPS Take 1 capsule by mouth daily. (Patient taking differently: Take 1 capsule by mouth in the morning.)   Docusate Calcium (STOOL SOFTENER PO) Take 1 Dose by mouth in the morning and  at bedtime.   DULoxetine  (CYMBALTA ) 30 MG capsule Take 1 capsule (30 mg total) by mouth daily.   hydrochlorothiazide  (MICROZIDE ) 12.5 MG capsule Take 1 capsule (12.5 mg total) by mouth daily.   loratadine (CLARITIN) 10 MG tablet Take 10 mg by mouth daily. (Patient taking differently: Take 10 mg by mouth in the morning.)   losartan  (COZAAR ) 100 MG tablet Take 1 tablet (100 mg total) by mouth daily.   meclizine  (ANTIVERT ) 25 MG tablet Take 1 tablet (25 mg total) by mouth 3 (three) times daily as needed for dizziness.   Multiple Vitamin (MULTIVITAMIN WITH MINERALS) TABS  tablet Take 1 tablet by mouth daily. (Patient taking differently: Take 1 tablet by mouth in the morning.)   Omega-3 1000 MG CAPS Take 3 capsules by mouth every morning.   omeprazole  (PRILOSEC) 20 MG capsule Take 1 capsule (20 mg total) by mouth daily.   polyethylene glycol powder (GLYCOLAX /MIRALAX ) 17 GM/SCOOP powder Take 17 g by mouth daily. Dissolve 1 capful (17g) in 4-8 ounces of liquid and take by mouth daily.   pravastatin  (PRAVACHOL ) 80 MG tablet Take 1 tablet by mouth in the evening   No facility-administered encounter medications on file as of 05/15/2024.    No Known Allergies  Pertinent ROS per HPI, otherwise unremarkable      Objective:  BP (!) 175/83   Pulse 64   Temp (!) 97 F (36.1 C)   Ht 5' 3 (1.6 m)   Wt 200 lb 9.6 oz (91 kg)   SpO2 94%   BMI 35.53 kg/m    Wt Readings from Last 3 Encounters:  05/15/24 200 lb 9.6 oz (91 kg)  01/30/24 195 lb (88.5 kg)  10/21/23 188 lb (85.3 kg)    Physical Exam Vitals and nursing note reviewed.  Constitutional:      General: She is not in acute distress.    Appearance: She is obese. She is not ill-appearing, toxic-appearing or diaphoretic.  HENT:     Head: Normocephalic and atraumatic.     Nose: Nose normal.     Mouth/Throat:     Mouth: Mucous membranes are moist.  Eyes:     Conjunctiva/sclera: Conjunctivae normal.     Pupils: Pupils are equal, round, and  reactive to light.  Cardiovascular:     Rate and Rhythm: Normal rate and regular rhythm.     Heart sounds: Normal heart sounds.  Abdominal:     General: Bowel sounds are normal. There is no distension.     Palpations: Abdomen is soft.     Tenderness: There is generalized abdominal tenderness. There is no right CVA tenderness or left CVA tenderness.  Musculoskeletal:     Cervical back: Neck supple.     Right lower leg: No edema.     Left lower leg: No edema.  Skin:    General: Skin is warm and dry.     Capillary Refill: Capillary refill takes less than 2 seconds.  Neurological:     General: No focal deficit present.     Mental Status: She is alert.  Psychiatric:        Mood and Affect: Mood normal.        Behavior: Behavior normal.    Imaging in office  CHEST: Chest x-ray normal. ABDOMEN: Nonobstructive bowel pattern with prominent stool in right colon. Bowel distended, full of gas. No bowel obstruction on KUB. Urine normal, no blood. No kidney stones on KUB. MUSCULOSKELETAL: Chronic compression deformities at T11 and L1.        Results for orders placed or performed in visit on 02/01/24  Lipid panel   Collection Time: 02/01/24  1:32 PM  Result Value Ref Range   Cholesterol, Total 190 100 - 199 mg/dL   Triglycerides 775 (H) 0 - 149 mg/dL   HDL 48 >60 mg/dL   VLDL Cholesterol Cal 38 5 - 40 mg/dL   LDL Chol Calc (NIH) 895 (H) 0 - 99 mg/dL   Chol/HDL Ratio 4.0 0.0 - 4.4 ratio  CMP14+EGFR   Collection Time: 02/01/24  1:32 PM  Result Value Ref Range   Glucose 93  70 - 99 mg/dL   BUN 15 8 - 27 mg/dL   Creatinine, Ser 9.15 0.57 - 1.00 mg/dL   eGFR 68 >40 fO/fpw/8.26   BUN/Creatinine Ratio 18 12 - 28   Sodium 137 134 - 144 mmol/L   Potassium 4.2 3.5 - 5.2 mmol/L   Chloride 97 96 - 106 mmol/L   CO2 25 20 - 29 mmol/L   Calcium 10.7 (H) 8.7 - 10.3 mg/dL   Total Protein 7.7 6.0 - 8.5 g/dL   Albumin 4.1 3.7 - 4.7 g/dL   Globulin, Total 3.6 1.5 - 4.5 g/dL   Bilirubin Total  <9.7 0.0 - 1.2 mg/dL   Alkaline Phosphatase 80 44 - 121 IU/L   AST 41 (H) 0 - 40 IU/L   ALT 32 0 - 32 IU/L  CBC with Differential/Platelet   Collection Time: 02/01/24  1:32 PM  Result Value Ref Range   WBC 6.7 3.4 - 10.8 x10E3/uL   RBC 4.64 3.77 - 5.28 x10E6/uL   Hemoglobin 14.5 11.1 - 15.9 g/dL   Hematocrit 56.3 65.9 - 46.6 %   MCV 94 79 - 97 fL   MCH 31.3 26.6 - 33.0 pg   MCHC 33.3 31.5 - 35.7 g/dL   RDW 86.7 88.2 - 84.5 %   Platelets 336 150 - 450 x10E3/uL   Neutrophils 53 Not Estab. %   Lymphs 36 Not Estab. %   Monocytes 9 Not Estab. %   Eos 1 Not Estab. %   Basos 1 Not Estab. %   Neutrophils Absolute 3.5 1.4 - 7.0 x10E3/uL   Lymphocytes Absolute 2.4 0.7 - 3.1 x10E3/uL   Monocytes Absolute 0.6 0.1 - 0.9 x10E3/uL   EOS (ABSOLUTE) 0.1 0.0 - 0.4 x10E3/uL   Basophils Absolute 0.1 0.0 - 0.2 x10E3/uL   Immature Granulocytes 0 Not Estab. %   Immature Grans (Abs) 0.0 0.0 - 0.1 x10E3/uL       Pertinent labs & imaging results that were available during my care of the patient were reviewed by me and considered in my medical decision making.  Assessment & Plan:  Keerthana was seen today for flank pain and cough.  Diagnoses and all orders for this visit:  Left flank pain -     Urine Culture -     Urinalysis, Routine w reflex microscopic -     DG Abd 1 View  Cough in adult -     DG Chest 2 View; Future  Sputum production -     DG Chest 2 View; Future  Constipation in female -     polyethylene glycol powder (GLYCOLAX /MIRALAX ) 17 GM/SCOOP powder; Take 17 g by mouth daily. Dissolve 1 capful (17g) in 4-8 ounces of liquid and take by mouth daily.      Constipation Chronic constipation with recent exacerbation. Bowel x-ray shows nonobstructive bowel pattern with prominent stool in the right colon. No evidence of obstruction. Symptoms include hard stools and difficulty with bowel movements despite use of stool softeners. - Perform Miralax  clean out: 4 capfuls in 32 ounces of  liquid, consumed slowly over a couple of hours. - Prescribed Miralax  for daily use post-clean out to maintain soft stools. - Ensure adequate hydration to support bowel regularity. - Continue stool softeners twice daily.  Left flank pain Intermittent left flank pain for 1-2 weeks. No history of kidney stones. Urinalysis shows no blood, and KUB x-ray shows no kidney stones. Pain may be related to constipation or referred pain from bowel issues. - Monitor  symptoms post-Miralax  clean out to assess for resolution of flank pain.  Cough with sputum production Cough with white sputum production for 1-2 weeks. No fever, shortness of breath, or recent changes in cough medication. Chest x-ray is normal. Previous lisinopril  use may have contributed to cough, but medication was switched. - No immediate intervention required as chest x-ray is normal and cough is not associated with acute respiratory infection.          Continue all other maintenance medications.  Follow up plan: Return if symptoms worsen or fail to improve.   Continue healthy lifestyle choices, including diet (rich in fruits, vegetables, and lean proteins, and low in salt and simple carbohydrates) and exercise (at least 30 minutes of moderate physical activity daily).  Educational handout given for constipation   The above assessment and management plan was discussed with the patient. The patient verbalized understanding of and has agreed to the management plan. Patient is aware to call the clinic if they develop any new symptoms or if symptoms persist or worsen. Patient is aware when to return to the clinic for a follow-up visit. Patient educated on when it is appropriate to go to the emergency department.   Rosaline Bruns, FNP-C [facility] [phone]

## 2024-05-15 NOTE — Telephone Encounter (Signed)
  FYI Only or Action Required?: Action required by provider: request for appointment.  Patient was last seen in primary care on 01/30/2024 by Dettinger, Fonda LABOR, MD.  Called Nurse Triage reporting Right flank pain.  Symptoms began several days ago.  Interventions attempted: Nothing.  Symptoms are: unchanged.Right flank pain with breathing, also right shoulder pain. No other symptoms.  Triage Disposition: See Physician Within 24 Hours  Patient/caregiver understands and will follow disposition?: Yes     Copied from CRM #[id number]. Topic: Clinical - Red Word Triage >> May 15, 2024  8:04 AM Montie POUR wrote: Red Word that prompted transfer to Nurse Triage:  Severe pain in right lower back in kidney area. This started the weekend. Right shoulder pain. Pain level 7 to 10 Reason for Disposition  MODERATE pain (e.g., interferes with normal activities or awakens from sleep)  Answer Assessment - Initial Assessment Questions 1. LOCATION: Where does it hurt? (e.g., left, right)     Right flank 2. ONSET: When did the pain start?     Saturday 3. SEVERITY: How bad is the pain? (e.g., Scale 1-10; mild, moderate, or severe)     severe 4. PATTERN: Does the pain come and go, or is it constant?      Constant 5. CAUSE: What do you think is causing the pain?     unsure 6. OTHER SYMPTOMS:  Do you have any other symptoms? (e.g., fever, abdomen pain, vomiting, leg weakness, burning with urination, blood in urine)     Right shoulder 7. PREGNANCY:  Is there any chance you are pregnant? When was your last menstrual period?     no  Protocols used: Flank Pain-A-AH

## 2024-05-15 NOTE — Telephone Encounter (Signed)
 Appt made.

## 2024-05-15 NOTE — Patient Instructions (Signed)
 Thank you for coming in to clinic today.  1. Your symptoms are consistent with Constipation, likely cause of your General Abdominal Pain / Cramping. 2. Start with Miralax, prescription was sent to pharmacy. First dose 68g (4 capfuls) in 32oz water over 1 to 2 hours for clean out. Next day start 17g or 1 capful daily, may adjust dose up or down by half a capful every few days. Recommend to take this medicine daily for next 1-2 weeks, you may need to use it longer if needed. - Goal is to have soft regular bowel movement 1-3x daily, if too runny or diarrhea, then reduce dose of the medicine to every other day.  Improve water intake, hydration will help Also recommend increased vegetables, fruits, fiber intake Can try daily Metamucil or Fiber supplement at pharmacy over the counter  Follow-up if symptoms are not improving with bowel movements, or if pain worsens, develop fevers, nausea, vomiting.  Please schedule a follow-up appointment with Kari Baars, FNP, in 1 month to follow-up Constipation  If you have any other questions or concerns, please feel free to call the clinic to contact me. You may also schedule an earlier appointment if necessary.  However, if your symptoms get significantly worse, please go to the Emergency Department to seek immediate medical attention.

## 2024-05-17 LAB — URINE CULTURE

## 2024-05-18 ENCOUNTER — Encounter: Payer: Self-pay | Admitting: Family Medicine

## 2024-05-18 ENCOUNTER — Ambulatory Visit: Payer: Self-pay | Admitting: Family Medicine

## 2024-05-18 ENCOUNTER — Ambulatory Visit: Admitting: Family Medicine

## 2024-05-18 ENCOUNTER — Ambulatory Visit: Payer: Self-pay | Admitting: *Deleted

## 2024-05-18 VITALS — BP 179/90 | HR 76 | Temp 97.0°F | Ht 63.0 in | Wt 199.6 lb

## 2024-05-18 DIAGNOSIS — R10A2 Flank pain, left side: Secondary | ICD-10-CM | POA: Diagnosis not present

## 2024-05-18 DIAGNOSIS — M545 Low back pain, unspecified: Secondary | ICD-10-CM | POA: Diagnosis not present

## 2024-05-18 DIAGNOSIS — S22080K Wedge compression fracture of T11-T12 vertebra, subsequent encounter for fracture with nonunion: Secondary | ICD-10-CM | POA: Diagnosis not present

## 2024-05-18 DIAGNOSIS — S32010S Wedge compression fracture of first lumbar vertebra, sequela: Secondary | ICD-10-CM

## 2024-05-18 DIAGNOSIS — G8929 Other chronic pain: Secondary | ICD-10-CM | POA: Diagnosis not present

## 2024-05-18 LAB — MICROSCOPIC EXAMINATION
Bacteria, UA: NONE SEEN
RBC, Urine: NONE SEEN /HPF (ref 0–2)
Renal Epithel, UA: NONE SEEN /HPF
Yeast, UA: NONE SEEN

## 2024-05-18 LAB — URINALYSIS, ROUTINE W REFLEX MICROSCOPIC
Bilirubin, UA: NEGATIVE
Glucose, UA: NEGATIVE
Ketones, UA: NEGATIVE
Nitrite, UA: NEGATIVE
RBC, UA: NEGATIVE
Specific Gravity, UA: 1.015 (ref 1.005–1.030)
Urobilinogen, Ur: 0.2 mg/dL (ref 0.2–1.0)
pH, UA: 7 (ref 5.0–7.5)

## 2024-05-18 MED ORDER — LIDOCAINE 5 % EX PTCH: 1.0000 | MEDICATED_PATCH | CUTANEOUS | 0 refills | Status: AC

## 2024-05-18 MED ORDER — METHYLPREDNISOLONE ACETATE 80 MG/ML IJ SUSP
80.0000 mg | Freq: Once | INTRAMUSCULAR | Status: AC
  Administered 2024-05-18: 40 mg via INTRAMUSCULAR

## 2024-05-18 NOTE — Telephone Encounter (Signed)
 Appointment scheduled.

## 2024-05-18 NOTE — Addendum Note (Signed)
 Addended by: OLENA RAISIN C on: 05/18/2024 11:46 AM   Modules accepted: Orders

## 2024-05-18 NOTE — Progress Notes (Signed)
 Subjective:  Patient ID: Caitlyn Boone, female    DOB: 1941/01/13, 83 y.o.   MRN: [id number]  Patient Care Team: Dettinger, Fonda LABOR, MD as PCP - General (Family Medicine) Teressa Toribio SQUIBB, MD (Inactive) as Attending Physician (Gastroenterology) Cleatus Collar, MD as Consulting Physician (Ophthalmology)   Chief Complaint:  Flank Pain (Left sided flank pain - patient was seen on 12/9 and states pain is no better )   HPI: Caitlyn Boone is a 83 y.o. female presenting on 05/18/2024 for Flank Pain (Left sided flank pain - patient was seen on 12/9 and states pain is no better )   Caitlyn Boone is an 83 year old female with chronic compression fractures who presents with back pain.  She experiences ongoing back pain, which is exacerbated by movement, particularly when getting up and walking. There is a history of chronic compression fractures at T11 and L1, with calcifications at L4 noted on recent x-rays. She has not seen a back specialist since her initial injury and was previously fitted with a back brace [facility], which she has not been using regularly. Some relief is found with heat, massage, and ice therapy.  In February, she sustained an elbow injury, which became the primary focus of her medical care at that time. She also experienced shingles in May, affecting her left flank, and was treated with Valtrex . She reports left flank pain that began approximately a week ago, with initial onset on a Saturday, and it has been persistent since then. The pain is described as worse with movement.  She has been using Miralax  for bowel movements, reporting four to five bowel movements quickly after use, and continues to take one cap daily. No loss of bowel or bladder function, and no numbness or tingling in the genital area.  A recent urinalysis showed one plus leukocytes, but a culture did not grow any bacteria.          Relevant past medical, surgical, family, and social history  reviewed and updated as indicated.  Allergies and medications reviewed and updated. Data reviewed: Chart in Epic.   Past Medical History:  Diagnosis Date   Compression fracture 05/24/2013   L1   GERD (gastroesophageal reflux disease)    Gout    Hyperlipidemia    Hypertension    Knee fracture, left feb 2015   no surgery done   Osteoporosis     Past Surgical History:  Procedure Laterality Date   ABDOMINAL HYSTERECTOMY  1992   complete   CHOLECYSTECTOMY N/A 06/28/2014   Procedure: LAPAROSCOPIC CHOLECYSTECTOMY ;  Surgeon: Elon Pacini, MD;  Location: [facility];  Service: General;  Laterality: N/A;   EYE SURGERY Bilateral 2013   lens for cataracts   LAMINECTOMY  1987   L5-S1   TONSILLECTOMY  1961    Social History   Socioeconomic History   Marital status: Married    Spouse name: Not on file   Number of children: 0   Years of education: 12   Highest education level: 12th grade  Occupational History   Occupation: Retired  Tobacco Use   Smoking status: Former    Current packs/day: 0.00    Types: Cigarettes    Quit date: 02/24/1979    Years since quitting: 45.2   Smokeless tobacco: Never   Tobacco comments:    smoked 2-3 cig a day when smoking, quit long ago  Vaping Use   Vaping status: Never Used  Substance and Sexual  Activity   Alcohol use: No    Alcohol/week: 0.0 standard drinks of alcohol   Drug use: No   Sexual activity: Not Currently  Other Topics Concern   Not on file  Social History Narrative   Has 3 step-children - one passed away   Ex son-in-law lives nearby and checks on her often   Social Drivers of Health   Tobacco Use: Medium Risk (05/18/2024)   Patient History    Smoking Tobacco Use: Former    Smokeless Tobacco Use: Never    Passive Exposure: Not on file  Financial Resource Strain: Low Risk (05/15/2024)   Overall Financial Resource Strain (CARDIA)    Difficulty of Paying Living Expenses: Not very hard  Food Insecurity: No Food Insecurity  (05/15/2024)   Epic    Worried About Radiation Protection Practitioner of Food in the Last Year: Never true    Ran Out of Food in the Last Year: Never true  Transportation Needs: Unknown (05/15/2024)   Epic    Lack of Transportation (Medical): Not on file    Lack of Transportation (Non-Medical): No  Physical Activity: Inactive (05/15/2024)   Exercise Vital Sign    Days of Exercise per Week: 0 days    Minutes of Exercise per Session: Not on file  Stress: Stress Concern Present (05/15/2024)   [employer] of Occupational Health - Occupational Stress Questionnaire    Feeling of Stress: To some extent  Social Connections: Socially Integrated (05/15/2024)   Social Connection and Isolation Panel    Frequency of Communication with Friends and Family: More than three times a week    Frequency of Social Gatherings with Friends and Family: More than three times a week    Attends Religious Services: More than 4 times per year    Active Member of [employer] or Organizations: Yes    Attends Banker Meetings: More than 4 times per year    Marital Status: Married  Catering Manager Violence: Not At Risk (09/12/2023)   Humiliation, Afraid, Rape, and Kick questionnaire    Fear of Current or Ex-Partner: No    Emotionally Abused: No    Physically Abused: No    Sexually Abused: No  Depression (PHQ2-9): Low Risk (10/21/2023)   Depression (PHQ2-9)    PHQ-2 Score: 0  Alcohol Screen: Low Risk (09/12/2023)   Alcohol Screen    Last Alcohol Screening Score (AUDIT): 0  Housing: Unknown (05/15/2024)   Epic    Unable to Pay for Housing in the Last Year: No    Number of Times Moved in the Last Year: Not on file    Homeless in the Last Year: No  Utilities: Not At Risk (09/12/2023)   AHC Utilities    Threatened with loss of utilities: No  Health Literacy: Adequate Health Literacy (09/12/2023)   B1300 Health Literacy    Frequency of need for help with medical instructions: Never    Outpatient Encounter Medications as of  05/18/2024  Medication Sig   alendronate  (FOSAMAX ) 70 MG tablet Take 1 tablet (70 mg total) by mouth every 7 (seven) days. Take with a full glass of water on an empty stomach.   allopurinol  (ZYLOPRIM ) 100 MG tablet Take 1 tablet (100 mg total) by mouth daily.   amLODipine  (NORVASC ) 5 MG tablet Take 1 tablet (5 mg total) by mouth daily.   ascorbic acid (VITAMIN C) 500 MG tablet Take 500 mg by mouth every morning.   aspirin EC 81 MG tablet Take 81 mg by mouth  in the morning. Swallow whole.   Calcium Citrate 1040 MG TABS Take by mouth. (Patient taking differently: Take 1 tablet by mouth daily after lunch.)   Cholecalciferol (VITAMIN D ) 2000 UNITS CAPS Take 1 capsule by mouth daily. (Patient taking differently: Take 1 capsule by mouth in the morning.)   Docusate Calcium (STOOL SOFTENER PO) Take 1 Dose by mouth in the morning and at bedtime.   DULoxetine  (CYMBALTA ) 30 MG capsule Take 1 capsule (30 mg total) by mouth daily.   hydrochlorothiazide  (MICROZIDE ) 12.5 MG capsule Take 1 capsule (12.5 mg total) by mouth daily.   lidocaine  (LIDODERM ) 5 % Place 1 patch onto the skin daily. Remove & Discard patch within 12 hours or as directed by MD   loratadine (CLARITIN) 10 MG tablet Take 10 mg by mouth daily. (Patient taking differently: Take 10 mg by mouth in the morning.)   losartan  (COZAAR ) 100 MG tablet Take 1 tablet (100 mg total) by mouth daily.   meclizine  (ANTIVERT ) 25 MG tablet Take 1 tablet (25 mg total) by mouth 3 (three) times daily as needed for dizziness.   Multiple Vitamin (MULTIVITAMIN WITH MINERALS) TABS tablet Take 1 tablet by mouth daily. (Patient taking differently: Take 1 tablet by mouth in the morning.)   Omega-3 1000 MG CAPS Take 3 capsules by mouth every morning.   omeprazole  (PRILOSEC) 20 MG capsule Take 1 capsule (20 mg total) by mouth daily.   polyethylene glycol powder (GLYCOLAX /MIRALAX ) 17 GM/SCOOP powder Take 17 g by mouth daily. Dissolve 1 capful (17g) in 4-8 ounces of liquid and  take by mouth daily.   pravastatin  (PRAVACHOL ) 80 MG tablet Take 1 tablet by mouth in the evening   No facility-administered encounter medications on file as of 05/18/2024.    Allergies[1]  Pertinent ROS per HPI, otherwise unremarkable      Objective:  BP (!) 179/90   Pulse 76   Temp (!) 97 F (36.1 C)   Ht 5' 3 (1.6 m)   Wt 199 lb 9.6 oz (90.5 kg)   SpO2 95%   BMI 35.36 kg/m    Wt Readings from Last 3 Encounters:  05/18/24 199 lb 9.6 oz (90.5 kg)  05/15/24 200 lb 9.6 oz (91 kg)  01/30/24 195 lb (88.5 kg)    Physical Exam Vitals and nursing note reviewed.  Constitutional:      General: She is not in acute distress.    Appearance: She is obese. She is not ill-appearing, toxic-appearing or diaphoretic.  HENT:     Head: Normocephalic and atraumatic.     Mouth/Throat:     Mouth: Mucous membranes are moist.  Eyes:     Pupils: Pupils are equal, round, and reactive to light.  Cardiovascular:     Rate and Rhythm: Normal rate.  Pulmonary:     Effort: Pulmonary effort is normal.  Abdominal:     Tenderness: There is no right CVA tenderness or left CVA tenderness.  Musculoskeletal:     Cervical back: Normal.     Thoracic back: Decreased range of motion.     Lumbar back: Decreased range of motion. Negative right straight leg raise test and negative left straight leg raise test.  Skin:    General: Skin is warm and dry.     Capillary Refill: Capillary refill takes less than 2 seconds.  Neurological:     General: No focal deficit present.     Mental Status: She is alert and oriented to person, place, and time.  Gait: Gait abnormal (slow, using cane).  Psychiatric:        Mood and Affect: Mood normal.        Behavior: Behavior normal.        Thought Content: Thought content normal.        Judgment: Judgment normal.      Results for orders placed or performed in visit on 05/15/24  Microscopic Examination   Collection Time: 05/15/24  2:26 PM   Urine  Result  Value Ref Range   WBC, UA 0-5 0 - 5 /hpf   RBC, Urine None seen 0 - 2 /hpf   Epithelial Cells (non renal) 0-10 0 - 10 /hpf   Renal Epithel, UA None seen None seen /hpf   Bacteria, UA None seen None seen/Few   Yeast, UA None seen None seen  Urinalysis, Routine w reflex microscopic   Collection Time: 05/15/24  2:26 PM  Result Value Ref Range   Specific Gravity, UA 1.015 1.005 - 1.030   pH, UA 7.0 5.0 - 7.5   Color, UA Yellow Yellow   Appearance Ur Clear Clear   Leukocytes,UA 1+ (A) Negative   Protein,UA Negative Negative/Trace   Glucose, UA Negative Negative   Ketones, UA Negative Negative   RBC, UA Negative Negative   Bilirubin, UA Negative Negative   Urobilinogen, Ur 0.2 0.2 - 1.0 mg/dL   Nitrite, UA Negative Negative   Microscopic Examination See below:   Urine Culture   Collection Time: 05/15/24  2:47 PM   Specimen: Urine   UR  Result Value Ref Range   Urine Culture, Routine Final report    Organism ID, Bacteria Comment        Pertinent labs & imaging results that were available during my care of the patient were reviewed by me and considered in my medical decision making.  Assessment & Plan:  Sharell was seen today for flank pain.  Diagnoses and all orders for this visit:  Left flank pain -     Urinalysis, Routine w reflex microscopic -     Urine Culture -     Ambulatory referral to Spine Surgery -     lidocaine  (LIDODERM ) 5 %; Place 1 patch onto the skin daily. Remove & Discard patch within 12 hours or as directed by MD  Chronic left-sided low back pain without sciatica -     Ambulatory referral to Spine Surgery -     lidocaine  (LIDODERM ) 5 %; Place 1 patch onto the skin daily. Remove & Discard patch within 12 hours or as directed by MD  Compression fracture of L1 vertebra, sequela -     Ambulatory referral to Spine Surgery -     lidocaine  (LIDODERM ) 5 %; Place 1 patch onto the skin daily. 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 Remove & Discard patch within 12 hours or as directed by MD       Chronic back pain due to compression fractures Chronic back pain likely due to compression fractures at T11 and L1, with calcifications at L4. Pain exacerbated by movement, suggesting musculoskeletal origin. No loss of bowel or bladder function, and no numbness or tingling in the genital area, reducing concern for spinal canal compression. Previous back injury and lack of  specialist follow-up noted. - Advise wearing back brace for a few hours daily - Continue using heat, massage, and ice for pain relief - Administer steroid injection for inflammation and pain - Refer to back specialist within [facility] for further evaluation  Postherpetic neuralgia Possible postherpetic neuralgia contributing to left flank pain, following shingles episode in May. Pain may be related to nerve irritation post-shingles.  Urinary tract infection (UTI) Previous urinalysis showed elevated leukocytes but no significant growth on culture, suggesting no active UTI. Current urinalysis to be reviewed for changes. - Review current urinalysis results for any changes  Constipation Improved bowel movements with Miralax , indicating effective management of constipation. Regular bowel movements reported with current regimen. - Continue Miralax , one cap daily          Continue all other maintenance medications.  Follow up plan: Return if symptoms worsen or fail to improve.   Continue healthy lifestyle choices, including diet (rich in fruits, vegetables, and lean proteins, and low in salt and simple carbohydrates) and exercise (at least 30 minutes of moderate physical activity daily).  Educational handout given for chronic back pain  The above assessment and management plan was  discussed with the patient. The patient verbalized understanding of and has agreed to the management plan. Patient is aware to call the clinic if they develop any new symptoms or if symptoms persist or worsen. Patient is aware when to return to the clinic for a follow-up visit. Patient educated on when it is appropriate to go to the emergency department.   Rosaline Bruns, FNP-C [facility] [phone]     [1] No Known Allergies

## 2024-05-18 NOTE — Telephone Encounter (Signed)
 FYI Only or Action Required?: FYI only for provider: appointment scheduled on 05/18/24.  Patient was last seen in primary care on 05/15/2024 by Severa Rock HERO, FNP.  Called Nurse Triage reporting Pain. Left side sharp pain with movement  Symptoms began several days ago.  Interventions attempted: OTC medications: tylenol  drinking more water, Prescription medications: miralax , Rest, hydration, or home remedies, and Ice/heat application.  Symptoms are: unchanged.  Triage Disposition: See Physician Within 24 Hours 4/24 hours  Patient/caregiver understands and will follow disposition?: Yes              Copied from CRM #[id number]. Topic: Clinical - Red Word Triage >> May 18, 2024  8:18 AM Susanna ORN wrote: Red Word that prompted transfer to Nurse Triage: Patient's son, Marites Nath, along with patient, calling in because patient is still having a lot of right side pain. States she was seen on Tuesday in office and had urine culture and x-rays done. Eli states patient's leukocytes were up. Reason for Disposition  MODERATE pain (e.g., interferes with normal activities or awakens from sleep)  Answer Assessment - Initial Assessment Questions Appt scheduled today . Last OV 05/15/24 for similar sx. Recommended if sx become severe go to ED or call 911.      1. LOCATION: Where does it hurt? (e.g., left, right)     Left side lower back  2. ONSET: When did the pain start?     Prior to 05/15/24 3. SEVERITY: How bad is the pain? (e.g., Scale 1-10; mild, moderate, or severe)     Moderate sharp pain  4. PATTERN: Does the pain come and go, or is it constant?      Comes and goes  5. CAUSE: What do you think is causing the pain?     Constipation but had miralax  cleanse and now going regularly 6. OTHER SYMPTOMS:  Do you have any other symptoms? (e.g., fever, abdomen pain, vomiting, leg weakness, burning with urination, blood in urine)     Left side pain, no N/V/D. No constipation  since starting miralax . Using heat/ ice tylenol  for pain. No issues with urinating   7. PREGNANCY:  Is there any chance you are pregnant? When was your last menstrual period?     na  Protocols used: Flank Pain-A-AH

## 2024-05-20 LAB — URINE CULTURE

## 2024-05-21 ENCOUNTER — Telehealth: Payer: Self-pay | Admitting: Pharmacy Technician

## 2024-05-21 ENCOUNTER — Other Ambulatory Visit (HOSPITAL_COMMUNITY): Payer: Self-pay

## 2024-05-21 NOTE — Telephone Encounter (Signed)
 Pharmacy Patient Advocate Encounter   Received notification from Onbase that prior authorization for Lidocaine  5% patches is required/requested.   Insurance verification completed.   The patient is insured through [facility].   Per test claim: PA required; PA submitted to above mentioned insurance via Latent Key/confirmation #/EOC [id number] Status is pending

## 2024-05-21 NOTE — Telephone Encounter (Signed)
 Pharmacy Patient Advocate Encounter  Received notification from [facility] that Prior Authorization for Lidocaine  5% patches has been APPROVED from 05/21/24 to 06/06/25. Ran test claim, Copay is $84.76. This test claim was processed through [facility]- copay amounts may vary at other pharmacies due to pharmacy/plan contracts, or as the patient moves through the different stages of their insurance plan.   PA #/Case ID/Reference #: [id number]

## 2024-05-21 NOTE — Telephone Encounter (Signed)
 Notified via MyChart. LS

## 2024-06-04 ENCOUNTER — Telehealth: Payer: Self-pay | Admitting: Family Medicine

## 2024-06-04 DIAGNOSIS — E785 Hyperlipidemia, unspecified: Secondary | ICD-10-CM

## 2024-06-04 MED ORDER — PRAVASTATIN SODIUM 80 MG PO TABS: 80.0000 mg | ORAL_TABLET | Freq: Every evening | ORAL | 1 refills | Status: AC

## 2024-06-04 NOTE — Telephone Encounter (Signed)
 Copied from CRM [id number]. Topic: Clinical - Prescription Issue >> Jun 04, 2024 10:13 AM Cherylann RAMAN wrote: Reason for CRM: Eli, son, called and stated that [facility] has sent over a request to have medication pravastatin  (PRAVACHOL ) 80 MG tablet transferred over to [facility]. All prescriptions will go through [facility] moving forward. For additional information please contact Cheryle, son, at [phone].

## 2024-06-04 NOTE — Telephone Encounter (Signed)
 [facility]ills [facility]

## 2024-06-23 ENCOUNTER — Other Ambulatory Visit: Payer: Self-pay | Admitting: Family Medicine

## 2024-07-04 ENCOUNTER — Encounter: Payer: Self-pay | Admitting: Orthopedic Surgery

## 2024-07-05 ENCOUNTER — Inpatient Hospital Stay
Admission: RE | Admit: 2024-07-05 | Discharge: 2024-07-05 | Disposition: A | Discharge status: Home / Self Care | Payer: Self-pay | Source: Ambulatory Visit | Attending: Orthopedic Surgery | Admitting: Orthopedic Surgery

## 2024-07-05 ENCOUNTER — Other Ambulatory Visit: Payer: Self-pay

## 2024-07-05 DIAGNOSIS — Z049 Encounter for examination and observation for unspecified reason: Secondary | ICD-10-CM

## 2024-07-11 ENCOUNTER — Ambulatory Visit: Admitting: Orthopedic Surgery

## 2024-07-12 NOTE — Progress Notes (Unsigned)
 "  Referring Physician:  Severa Rock HERO, FNP [address] [place],  [place] [address]  Primary Physician:  Dettinger, Fonda LABOR, MD  History of Present Illness: 07/13/2024 [name] has a history of HTN, GERD, osteoporosis, anxiety, gout, hyperlipidemia, hypertriglyceridemia, obesity.   She has history of lumbar laminectomy L5-S1 in 1987.   History of compression fractures at T11 and L1.   She had a bad fall in February of 2025- fractured her elbow and jarred her back. She was given LSO brace at that time. She was lost to follow up.   She has intermittent mid to lower back pain that is worse with household chores/dishes/vacuuming. She has pain with prolonged standing. Pain improves if she stops to rest. No leg pain. No numbness, tingling, or weakness.   Tobacco use: Does not smoke.   Bowel/Bladder Dysfunction: none  Conservative measures: heat, ice, massages Physical therapy:  has not participated recently for her back Multimodal medical therapy including regular antiinflammatories:  cymbalta , Lidocaine  patches Injections:  No lumbar injections  Past Surgery:  1987- Lumbar laminectomy L5-S1  Caitlyn Boone has no symptoms of cervical myelopathy.  The symptoms are causing a significant impact on the patient's life.   Review of Systems:  A 10 point review of systems is negative, except for the pertinent positives and negatives detailed in the HPI.  Past Medical History: Past Medical History:  Diagnosis Date   Compression fracture 05/24/2013   L1   GERD (gastroesophageal reflux disease)    Gout    Hyperlipidemia    Hypertension    Knee fracture, left feb 2015   no surgery done   Osteoporosis     Past Surgical History: Past Surgical History:  Procedure Laterality Date   ABDOMINAL HYSTERECTOMY  1992   complete   CHOLECYSTECTOMY N/A 06/28/2014   Procedure: LAPAROSCOPIC CHOLECYSTECTOMY ;  Surgeon: Elon Pacini, MD;  Location: [facility];  Service: General;   Laterality: N/A;   EYE SURGERY Bilateral 2013   lens for cataracts   LAMINECTOMY  1987   L5-S1   TONSILLECTOMY  1961    Allergies: Allergies as of 07/13/2024   (No Known Allergies)    Medications: Outpatient Encounter Medications as of 07/13/2024  Medication Sig   alendronate  (FOSAMAX ) 70 MG tablet TAKE 1 TABLET BY MOUTH WEEKLY  WITH 8 OZ OF PLAIN WATER 30  MINUTES BEFORE FIRST FOOD, DRINK OR MEDS. STAY UPRIGHT FOR 30  MINS   allopurinol  (ZYLOPRIM ) 100 MG tablet Take 1 tablet (100 mg total) by mouth daily.   amLODipine  (NORVASC ) 5 MG tablet Take 1 tablet (5 mg total) by mouth daily.   ascorbic acid (VITAMIN C) 500 MG tablet Take 500 mg by mouth every morning.   aspirin EC 81 MG tablet Take 81 mg by mouth in the morning. Swallow whole.   Calcium Citrate 1040 MG TABS Take by mouth. (Patient taking differently: Take 1 tablet by mouth daily after lunch.)   Cholecalciferol (VITAMIN D ) 2000 UNITS CAPS Take 1 capsule by mouth daily. (Patient taking differently: Take 1 capsule by mouth in the morning.)   Docusate Calcium (STOOL SOFTENER PO) Take 1 Dose by mouth in the morning and at bedtime.   DULoxetine  (CYMBALTA ) 30 MG capsule Take 1 capsule (30 mg total) by mouth daily.   hydrochlorothiazide  (MICROZIDE ) 12.5 MG capsule Take 1 capsule (12.5 mg total) by mouth daily.   lidocaine  (LIDODERM ) 5 % Place 1 patch onto the skin daily. Remove & Discard patch within  12 hours or as directed by MD   loratadine (CLARITIN) 10 MG tablet Take 10 mg by mouth daily. (Patient taking differently: Take 10 mg by mouth in the morning.)   losartan  (COZAAR ) 100 MG tablet Take 1 tablet (100 mg total) by mouth daily.   meclizine  (ANTIVERT ) 25 MG tablet Take 1 tablet (25 mg total) by mouth 3 (three) times daily as needed for dizziness.   Multiple Vitamin (MULTIVITAMIN WITH MINERALS) TABS tablet Take 1 tablet by mouth daily. (Patient taking differently: Take 1 tablet by mouth in the morning.)   Omega-3 1000 MG CAPS Take 3  capsules by mouth every morning.   omeprazole  (PRILOSEC) 20 MG capsule Take 1 capsule (20 mg total) by mouth daily.   polyethylene glycol powder (GLYCOLAX /MIRALAX ) 17 GM/SCOOP powder Take 17 g by mouth daily. Dissolve 1 capful (17g) in 4-8 ounces of liquid and take by mouth daily.   pravastatin  (PRAVACHOL ) 80 MG tablet Take 1 tablet (80 mg total) by mouth every evening.   No facility-administered encounter medications on file as of 07/13/2024.    Social History: Social History[1]  Family Medical History: Family History  Problem Relation Age of Onset   Heart disease Mother    Cancer Father        smoker   Cancer Brother        lung and colon   Colon cancer Brother    Hip fracture Brother    Dementia Brother 39   COPD Brother    Parkinson's disease Brother    Breast cancer Neg Hx     Physical Examination: Vitals:   07/13/24 1002  BP: 132/84    General: Patient is well developed, well nourished, calm, collected, and in no apparent distress. Attention to examination is appropriate.  Respiratory: Patient is breathing without any difficulty.   NEUROLOGICAL:     Awake, alert, oriented to person, place, and time.  Speech is clear and fluent. Fund of knowledge is appropriate.   Cranial Nerves: Pupils equal round and reactive to light.  Facial tone is symmetric.    She has no thoracic or lumbar tenderness.   No abnormal lesions on exposed skin.   Strength: Side Biceps Triceps Deltoid Interossei Grip Wrist Ext. Wrist Flex.  R 5 5 5 5 5 5 5   L 5 5 5 5 5 5 5    Side Iliopsoas Quads Hamstring PF DF EHL  R 5 5 5 5 5 5   L 5 5 5 5 5 5    Reflexes are 1+ and symmetric at the biceps, brachioradialis, patella and achilles.   Hoffman's is absent.  Clonus is not present.   Bilateral upper and lower extremity sensation is intact to light touch.     No pain with IR/ER of both hips.   She ambulates with a cane.   Medical Decision Making  Imaging: Thoracolumbar xrays dated  07/17/23:  IMPRESSION:  Redemonstrated age indeterminant compression deformities of T11, L1 and L4 with similar degree of height loss.    CT thoracic/lumbar spine dated 07/17/23:  FINDINGS:  Alignment: No acute traumatic malalignment.   Vertebrae: Osteopenia. Compression deformities of the T11 vertebral body with approximately 80% anterior height loss and of the L1 vertebral body with approximately 90% anterior height loss. Mild L4 and L5 vertebral body height loss.   Degenerative changes: No high-grade canal stenosis. Posterior disc osteophyte complexes at T4-T5, T7-T8, T10-T11 causing mild canal stenosis. Posterior disc osteophyte complex at T12-L1 causing moderate canal stenosis.   Suspected  left superior parathyroid  adenoma measuring 15 mm (series 6, image 34).   Impression  Age-indeterminate compression deformities of the T11 and L1 vertebral bodies and mild L4 and L5 vertebral body height loss, as detailed above. Advise correlation with the presence or absence of focal tenderness. MRI could further characterize, if clinically warranted.  Suspected left superior parathyroid  adenoma measuring 15 mm. Advise correlation with serum calcium and serum PTH for possible primary hyperparathyroidism.  Please reference the CT of the chest, abdomen, and pelvis for characterization of all structures outside the thoracic and lumbar spine.   I have personally reviewed the images and agree with the above interpretation.  Assessment and Plan: Ms. Eisman  has a history of lumbar laminectomy L5-S1 in 1987.   History of compression fractures at T11 and L1.   She had a bad fall in February of 2025- fractured her elbow and jarred her back. She was given LSO brace at that time.   She has intermittent mid to lower back pain that is worse with household chores/dishes/vacuuming. She has pain with prolonged standing. Pain improves if she stops to rest. No leg pain.   Previous imaging from 07/17/23 shows  compression fractures at T11 and L1.   Treatment options discussed with patient and following plan made:   - Lumbar xrays with flex/ext done on her way out.  - She can wear LSO brace prn with chores.  - Will message her with xray results.  - Depending on xray results, will likely consider PT. She can do this close to home at [facility] in [place], [place].   Of note, report from CT on 07/17/23 read suspected left superior parathyroid  adenoma measuring 15 mm. Advise correlation with serum calcium and serum PTH for possible primary hyperparathyroidism. She was not aware of this. She will f/u with PCP about it.   I spent a total of 35 minutes in face-to-face and non-face-to-face activities related to this patient's care today including review of outside records, review of imaging, review of symptoms, physical exam, discussion of differential diagnosis, discussion of treatment options, and documentation.   Thank you for involving me in the care of this patient.   Glade Boys PA-C Dept. of Neurosurgery      [1]  Social History Tobacco Use   Smoking status: Former    Current packs/day: 0.00    Types: Cigarettes    Quit date: 02/24/1979    Years since quitting: 45.4   Smokeless tobacco: Never   Tobacco comments:    smoked 2-3 cig a day when smoking, quit long ago  Vaping Use   Vaping status: Never Used  Substance Use Topics   Alcohol use: No    Alcohol/week: 0.0 standard drinks of alcohol   Drug use: No   "

## 2024-07-13 ENCOUNTER — Encounter: Payer: Self-pay | Admitting: Orthopedic Surgery

## 2024-07-13 ENCOUNTER — Ambulatory Visit: Admitting: Orthopedic Surgery

## 2024-07-13 ENCOUNTER — Ambulatory Visit

## 2024-07-13 VITALS — BP 132/84 | Ht 63.0 in | Wt 197.0 lb

## 2024-07-13 DIAGNOSIS — M47816 Spondylosis without myelopathy or radiculopathy, lumbar region: Secondary | ICD-10-CM

## 2024-07-13 DIAGNOSIS — S32010S Wedge compression fracture of first lumbar vertebra, sequela: Secondary | ICD-10-CM

## 2024-07-13 DIAGNOSIS — S22080S Wedge compression fracture of T11-T12 vertebra, sequela: Secondary | ICD-10-CM

## 2024-07-13 NOTE — Patient Instructions (Signed)
 It was so nice to see you today. Thank you so much for coming in.    You have a history of T11 and L1 compression fractures, I want to get some xrays today to look at things.   I will send you a message once I get the xrays back.   You can wear your brace as needed when you do chores. Depending on the xrays, we may consider physical therapy.   Your imaging from last February, mentions possible abnormality in your parathyroid  (in your neck). If you have not followed up with PCP about this, I would check to see if anything further needs done. It showed a suspected left superior parathyroid  adenoma measuring 15 mm. Advise correlation with serum calcium and serum PTH for possible primary hyperparathyroidism.  Please do not hesitate to call if you have any questions or concerns. You can also message me in MyChart.   Glade Boys PA-C [phone]     The physicians and staff at [facility] are committed to providing excellent care. You may receive a survey asking for feedback about your experience at our office. We value you your feedback and appreciate you taking the time to to fill it out. The [facility] leadership team is also available to discuss your experience in person, feel free to contact us  [phone].

## 2024-08-01 ENCOUNTER — Ambulatory Visit: Admitting: Podiatry

## 2024-08-01 ENCOUNTER — Ambulatory Visit: Payer: Self-pay | Admitting: Family Medicine

## 2024-09-12 ENCOUNTER — Ambulatory Visit: Payer: Self-pay
